# Patient Record
Sex: Female | Born: 1955 | Race: White | Hispanic: No | Marital: Married | Smoking: Former smoker
Health system: Southern US, Community
[De-identification: ages and names within clinical notes are randomized; demographics above are authoritative.]

## PROBLEM LIST (undated history)

## (undated) DIAGNOSIS — D6851 Activated protein C resistance: Secondary | ICD-10-CM

## (undated) DIAGNOSIS — D649 Anemia, unspecified: Secondary | ICD-10-CM

## (undated) DIAGNOSIS — G5600 Carpal tunnel syndrome, unspecified upper limb: Secondary | ICD-10-CM

## (undated) DIAGNOSIS — R112 Nausea with vomiting, unspecified: Secondary | ICD-10-CM

## (undated) DIAGNOSIS — S92902A Unspecified fracture of left foot, initial encounter for closed fracture: Secondary | ICD-10-CM

## (undated) DIAGNOSIS — Z9889 Other specified postprocedural states: Secondary | ICD-10-CM

## (undated) DIAGNOSIS — M8580 Other specified disorders of bone density and structure, unspecified site: Secondary | ICD-10-CM

## (undated) DIAGNOSIS — Z8719 Personal history of other diseases of the digestive system: Secondary | ICD-10-CM

## (undated) DIAGNOSIS — R55 Syncope and collapse: Secondary | ICD-10-CM

## (undated) DIAGNOSIS — F32A Depression, unspecified: Secondary | ICD-10-CM

## (undated) DIAGNOSIS — F419 Anxiety disorder, unspecified: Secondary | ICD-10-CM

## (undated) DIAGNOSIS — E162 Hypoglycemia, unspecified: Secondary | ICD-10-CM

## (undated) DIAGNOSIS — N9489 Other specified conditions associated with female genital organs and menstrual cycle: Secondary | ICD-10-CM

## (undated) DIAGNOSIS — K649 Unspecified hemorrhoids: Secondary | ICD-10-CM

## (undated) DIAGNOSIS — D179 Benign lipomatous neoplasm, unspecified: Secondary | ICD-10-CM

## (undated) DIAGNOSIS — E559 Vitamin D deficiency, unspecified: Secondary | ICD-10-CM

## (undated) DIAGNOSIS — Z8742 Personal history of other diseases of the female genital tract: Secondary | ICD-10-CM

## (undated) DIAGNOSIS — E041 Nontoxic single thyroid nodule: Secondary | ICD-10-CM

## (undated) DIAGNOSIS — Z86010 Personal history of colonic polyps: Secondary | ICD-10-CM

## (undated) DIAGNOSIS — E063 Autoimmune thyroiditis: Secondary | ICD-10-CM

## (undated) DIAGNOSIS — F329 Major depressive disorder, single episode, unspecified: Secondary | ICD-10-CM

## (undated) DIAGNOSIS — M1990 Unspecified osteoarthritis, unspecified site: Secondary | ICD-10-CM

## (undated) DIAGNOSIS — E039 Hypothyroidism, unspecified: Secondary | ICD-10-CM

## (undated) HISTORY — PX: DILATION AND CURETTAGE OF UTERUS: SHX78

## (undated) HISTORY — PX: UPPER GI ENDOSCOPY: SHX6162

## (undated) HISTORY — PX: PELVIC LAPAROSCOPY: SHX162

## (undated) HISTORY — DX: Other specified disorders of bone density and structure, unspecified site: M85.80

## (undated) HISTORY — PX: BLEPHAROPLASTY: SUR158

## (undated) HISTORY — DX: Major depressive disorder, single episode, unspecified: F32.9

## (undated) HISTORY — DX: Autoimmune thyroiditis: E06.3

## (undated) HISTORY — PX: ENDOMETRIAL ABLATION: SHX621

## (undated) HISTORY — DX: Activated protein C resistance: D68.51

## (undated) HISTORY — DX: Hypoglycemia, unspecified: E16.2

## (undated) HISTORY — DX: Anxiety disorder, unspecified: F41.9

## (undated) HISTORY — PX: LIPOMA EXCISION: SHX5283

## (undated) HISTORY — PX: COLONOSCOPY: SHX174

## (undated) HISTORY — DX: Depression, unspecified: F32.A

---

## 1974-04-14 HISTORY — PX: TUBAL LIGATION: SHX77

## 1998-10-23 ENCOUNTER — Other Ambulatory Visit: Admission: RE | Admit: 1998-10-23 | Discharge: 1998-10-23 | Payer: Self-pay | Admitting: Gynecology

## 1999-02-14 ENCOUNTER — Emergency Department (HOSPITAL_COMMUNITY): Admission: EM | Admit: 1999-02-14 | Discharge: 1999-02-14 | Payer: Self-pay | Admitting: Emergency Medicine

## 1999-02-14 ENCOUNTER — Encounter: Payer: Self-pay | Admitting: Emergency Medicine

## 1999-07-08 ENCOUNTER — Other Ambulatory Visit: Admission: RE | Admit: 1999-07-08 | Discharge: 1999-07-08 | Payer: Self-pay | Admitting: Gynecology

## 1999-07-08 ENCOUNTER — Encounter (INDEPENDENT_AMBULATORY_CARE_PROVIDER_SITE_OTHER): Payer: Self-pay | Admitting: Specialist

## 1999-07-23 ENCOUNTER — Encounter: Payer: Self-pay | Admitting: Gynecology

## 1999-07-24 ENCOUNTER — Ambulatory Visit (HOSPITAL_COMMUNITY): Admission: RE | Admit: 1999-07-24 | Discharge: 1999-07-24 | Payer: Self-pay | Admitting: Internal Medicine

## 1999-07-24 ENCOUNTER — Encounter (INDEPENDENT_AMBULATORY_CARE_PROVIDER_SITE_OTHER): Payer: Self-pay | Admitting: *Deleted

## 1999-11-05 ENCOUNTER — Encounter: Payer: Self-pay | Admitting: Neurosurgery

## 1999-11-05 ENCOUNTER — Ambulatory Visit (HOSPITAL_COMMUNITY): Admission: RE | Admit: 1999-11-05 | Discharge: 1999-11-05 | Payer: Self-pay | Admitting: Neurosurgery

## 1999-11-14 ENCOUNTER — Encounter: Payer: Self-pay | Admitting: Neurosurgery

## 1999-11-14 ENCOUNTER — Encounter: Admission: RE | Admit: 1999-11-14 | Discharge: 1999-11-14 | Payer: Self-pay | Admitting: Neurosurgery

## 1999-12-03 ENCOUNTER — Other Ambulatory Visit: Admission: RE | Admit: 1999-12-03 | Discharge: 1999-12-03 | Payer: Self-pay | Admitting: Gynecology

## 1999-12-04 ENCOUNTER — Encounter: Payer: Self-pay | Admitting: Gynecology

## 1999-12-04 ENCOUNTER — Ambulatory Visit (HOSPITAL_COMMUNITY): Admission: RE | Admit: 1999-12-04 | Discharge: 1999-12-04 | Payer: Self-pay | Admitting: Gynecology

## 2000-01-01 ENCOUNTER — Encounter (INDEPENDENT_AMBULATORY_CARE_PROVIDER_SITE_OTHER): Payer: Self-pay | Admitting: Specialist

## 2000-01-01 ENCOUNTER — Inpatient Hospital Stay (HOSPITAL_COMMUNITY): Admission: RE | Admit: 2000-01-01 | Discharge: 2000-01-04 | Payer: Self-pay | Admitting: Gynecology

## 2000-01-09 ENCOUNTER — Ambulatory Visit (HOSPITAL_COMMUNITY): Admission: RE | Admit: 2000-01-09 | Discharge: 2000-01-09 | Payer: Self-pay | Admitting: Gynecology

## 2000-01-09 ENCOUNTER — Encounter: Payer: Self-pay | Admitting: Gynecology

## 2000-01-10 ENCOUNTER — Inpatient Hospital Stay (HOSPITAL_COMMUNITY): Admission: AD | Admit: 2000-01-10 | Discharge: 2000-01-10 | Payer: Self-pay | Admitting: Gynecology

## 2000-05-15 DIAGNOSIS — N9489 Other specified conditions associated with female genital organs and menstrual cycle: Secondary | ICD-10-CM

## 2000-05-15 HISTORY — DX: Other specified conditions associated with female genital organs and menstrual cycle: N94.89

## 2000-06-05 ENCOUNTER — Ambulatory Visit (HOSPITAL_COMMUNITY): Admission: RE | Admit: 2000-06-05 | Discharge: 2000-06-05 | Payer: Self-pay | Admitting: Gynecology

## 2000-06-05 ENCOUNTER — Encounter: Payer: Self-pay | Admitting: Gynecology

## 2000-07-01 ENCOUNTER — Ambulatory Visit: Admission: RE | Admit: 2000-07-01 | Discharge: 2000-07-01 | Payer: Self-pay | Admitting: Gynecology

## 2000-07-08 ENCOUNTER — Encounter (INDEPENDENT_AMBULATORY_CARE_PROVIDER_SITE_OTHER): Payer: Self-pay | Admitting: Specialist

## 2000-07-08 ENCOUNTER — Inpatient Hospital Stay (HOSPITAL_COMMUNITY): Admission: RE | Admit: 2000-07-08 | Discharge: 2000-07-10 | Payer: Self-pay | Admitting: Gynecology

## 2001-04-14 HISTORY — PX: ABDOMINAL HYSTERECTOMY: SHX81

## 2001-04-14 HISTORY — PX: CARPAL TUNNEL RELEASE: SHX101

## 2001-06-03 ENCOUNTER — Other Ambulatory Visit: Admission: RE | Admit: 2001-06-03 | Discharge: 2001-06-03 | Payer: Self-pay | Admitting: Gynecology

## 2002-06-20 ENCOUNTER — Encounter: Payer: Self-pay | Admitting: Family Medicine

## 2002-06-20 ENCOUNTER — Encounter: Admission: RE | Admit: 2002-06-20 | Discharge: 2002-06-20 | Payer: Self-pay | Admitting: Family Medicine

## 2002-06-21 DIAGNOSIS — Z8719 Personal history of other diseases of the digestive system: Secondary | ICD-10-CM

## 2002-06-21 HISTORY — DX: Personal history of other diseases of the digestive system: Z87.19

## 2002-11-02 ENCOUNTER — Ambulatory Visit (HOSPITAL_COMMUNITY): Admission: RE | Admit: 2002-11-02 | Discharge: 2002-11-02 | Payer: Self-pay | Admitting: Family Medicine

## 2002-11-02 ENCOUNTER — Encounter: Payer: Self-pay | Admitting: Family Medicine

## 2003-03-16 ENCOUNTER — Other Ambulatory Visit: Admission: RE | Admit: 2003-03-16 | Discharge: 2003-03-16 | Payer: Self-pay | Admitting: Gynecology

## 2004-03-19 ENCOUNTER — Other Ambulatory Visit: Admission: RE | Admit: 2004-03-19 | Discharge: 2004-03-19 | Payer: Self-pay | Admitting: Gynecology

## 2004-04-25 DIAGNOSIS — Z8719 Personal history of other diseases of the digestive system: Secondary | ICD-10-CM

## 2004-04-25 DIAGNOSIS — Z860100 Personal history of colon polyps, unspecified: Secondary | ICD-10-CM

## 2004-04-25 DIAGNOSIS — K649 Unspecified hemorrhoids: Secondary | ICD-10-CM

## 2004-04-25 HISTORY — DX: Personal history of colon polyps, unspecified: Z86.0100

## 2004-04-25 HISTORY — DX: Unspecified hemorrhoids: K64.9

## 2004-04-25 HISTORY — DX: Personal history of other diseases of the digestive system: Z87.19

## 2004-04-25 HISTORY — DX: Personal history of colonic polyps: Z86.010

## 2005-03-20 ENCOUNTER — Other Ambulatory Visit: Admission: RE | Admit: 2005-03-20 | Discharge: 2005-03-20 | Payer: Self-pay | Admitting: Gynecology

## 2005-04-21 ENCOUNTER — Encounter: Admission: RE | Admit: 2005-04-21 | Discharge: 2005-07-20 | Payer: Self-pay | Admitting: Gynecology

## 2006-03-23 ENCOUNTER — Other Ambulatory Visit: Admission: RE | Admit: 2006-03-23 | Discharge: 2006-03-23 | Payer: Self-pay | Admitting: Gynecology

## 2006-05-02 ENCOUNTER — Emergency Department (HOSPITAL_COMMUNITY): Admission: EM | Admit: 2006-05-02 | Discharge: 2006-05-02 | Payer: Self-pay | Admitting: Emergency Medicine

## 2007-03-25 ENCOUNTER — Other Ambulatory Visit: Admission: RE | Admit: 2007-03-25 | Discharge: 2007-03-25 | Payer: Self-pay | Admitting: Gynecology

## 2007-05-31 ENCOUNTER — Encounter: Admission: RE | Admit: 2007-05-31 | Discharge: 2007-05-31 | Payer: Self-pay | Admitting: Family Medicine

## 2008-03-23 ENCOUNTER — Ambulatory Visit: Payer: Self-pay | Admitting: Otolaryngology

## 2008-03-27 ENCOUNTER — Encounter: Payer: Self-pay | Admitting: Gynecology

## 2008-03-27 ENCOUNTER — Ambulatory Visit: Payer: Self-pay | Admitting: Gynecology

## 2008-03-27 ENCOUNTER — Other Ambulatory Visit: Admission: RE | Admit: 2008-03-27 | Discharge: 2008-03-27 | Payer: Self-pay | Admitting: Gynecology

## 2008-04-14 HISTORY — PX: KNEE ARTHROSCOPY: SUR90

## 2008-04-24 ENCOUNTER — Ambulatory Visit: Payer: Self-pay | Admitting: Gynecology

## 2008-06-26 ENCOUNTER — Ambulatory Visit: Payer: Self-pay | Admitting: Gynecology

## 2008-08-16 ENCOUNTER — Ambulatory Visit: Payer: Self-pay | Admitting: Otolaryngology

## 2008-09-12 ENCOUNTER — Ambulatory Visit: Payer: Self-pay | Admitting: Gynecology

## 2009-03-28 ENCOUNTER — Other Ambulatory Visit: Admission: RE | Admit: 2009-03-28 | Discharge: 2009-03-28 | Payer: Self-pay | Admitting: Gynecology

## 2009-03-28 ENCOUNTER — Ambulatory Visit: Payer: Self-pay | Admitting: Gynecology

## 2009-04-03 ENCOUNTER — Ambulatory Visit: Payer: Self-pay | Admitting: Gynecology

## 2009-07-26 ENCOUNTER — Encounter: Admission: RE | Admit: 2009-07-26 | Discharge: 2009-07-26 | Payer: Self-pay | Admitting: Endocrinology

## 2010-01-28 ENCOUNTER — Encounter: Admission: RE | Admit: 2010-01-28 | Discharge: 2010-01-28 | Payer: Self-pay | Admitting: Endocrinology

## 2010-03-29 ENCOUNTER — Ambulatory Visit: Payer: Self-pay | Admitting: Gynecology

## 2010-03-29 ENCOUNTER — Other Ambulatory Visit
Admission: RE | Admit: 2010-03-29 | Discharge: 2010-03-29 | Payer: Self-pay | Source: Home / Self Care | Admitting: Gynecology

## 2010-04-01 ENCOUNTER — Ambulatory Visit: Payer: Self-pay | Admitting: Gynecology

## 2010-05-14 ENCOUNTER — Ambulatory Visit
Admission: RE | Admit: 2010-05-14 | Discharge: 2010-05-14 | Payer: Self-pay | Source: Home / Self Care | Attending: Gynecology | Admitting: Gynecology

## 2010-08-30 NOTE — Op Note (Signed)
[facility]  Patient:    Sandra Roy, Sandra Roy                 MRN: [id number] Proc. Date: 07/08/00 Adm. Date:  [id number] Attending:  Tonye Royalty CC:         Gaetano Hawthorne. Lily Peer, M.D.             Telford Nab, R.N.                           Operative Report  PREOPERATIVE DIAGNOSIS:  Complex left adnexal mass.  POSTOPERATIVE DIAGNOSES:  Hydrosalpinx, pelvic adhesive disease, retroperitoneal fibrosis.  PROCEDURE:  Exploratory laparotomy, lysis of intestinal adhesions, ureterolysis, left salpingo-oophorectomy.  SURGEON:  Daniel L. Clarke-Pearson, M.D.  ASSISTANT:  Reynaldo Minium, M.D.  ANESTHESIA:  General with oral tracheal tube.  ESTIMATED BLOOD LOSS:  20 cc.  SURGICAL FINDINGS:  At exploratory laparotomy, the patient had dense adhesions of the sigmoid colon to the left pelvic side wall encasing the left tube and ovary. There was retroperitoneal fibrosis encountered encasing the left ureter. Once the mass was dissected free from the colon and pelvic side wall, it was found to be a hydrosalpinx and a cystic lesion on the ovary both of which were benign on frozen section. The remainder of the pelvis appeared relatively normal except for adhesions of the side wall to the sigmoid colon.  DESCRIPTION OF PROCEDURE:  The patient was brought to the operating room and after satisfactory attainment of general anesthesia was placed in the modified lithotomy position in Moenkopi stirrups. The anterior abdominal wall, perineum and vagina were prepped with Betadine, Foley catheter was placed and the patient was draped. The abdomen was entered through a Pfannenstiel incision. Adhesions of the colon to the left pelvic side wall were lysed. A Buchwalter retractor was positioned and the small bowel was packed out of the pelvis. The left retroperitoneal space was opened identifying the external iliac artery and vein and ureter. Using blunt and sharp  dissection and cautery for hemostasis, the sigmoid colon was freed from the pelvic side wall. The ovarian vessels were skeletonized, isolated, clamped, cut, free tied and suture ligated. Throughout the procedure, the ureter was observed to be free from the field. Using sharp and blunt dissection, ureterolysis is performed in order to mobilize the ureter from the side wall and away from the pelvic mass. The sigmoid was further dissected free from the tube and ovary. The adherence to the vaginal cuff was clamped with Anderson clamps, divided and suture ligated using 2-0 Vicryl until the entire specimen was removed. This was submitted to frozen section and reported as benign. The pelvis was irrigated with copious amounts of warm saline, hemostasis was ascertained. The packs and retractors removed, the anterior abdominal wall was closed in layers the first being a running suture of 2-0 Vicryl on the peritoneum. Hemostasis was achieved with cautery. The fascia was reapproximated with a running suture of #0 PDS. The subcutaneous tissue was irrigated, hemostasis achieved with cautery and the skin closed with skin staples. A dressing was applied. The patient was awakened from anesthesia and taken to the recovery room in satisfactory condition. Sponge, needle and instrument counts were correct x 2. DD:  07/08/00 TD:  07/08/00 Job: [id number]

## 2010-08-30 NOTE — H&P (Signed)
[facility]  Patient:    Sandra Roy, Sandra Roy                 MRN: [id number] Adm. Date:  07/01/00 Attending:  Rande Brunt. Clarke-Pearson, M.D. CC:         Gaetano Hawthorne. Lily Peer, M.D.  Telford Nab, R.N.   History and Physical  NEW PATIENT EVALUATION  HISTORY OF PRESENT ILLNESS:  55 year old white female seen at the request of Dr. Gaetano Hawthorne. Lily Peer for preoperative preparation prior to surgery for a complex adnexal mass.  The patients gynecologic history dates back essentially to April of 2001 when she underwent endometrial ablation for dysfunctional uterine bleeding. Subsequent to that, she was found to have a right ovarian cyst and underwent exploratory laparotomy and right salpino-oophorectomy on January 01, 2000. 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  Subsequently, the patient is thought to have developed a hematoma in the pelvis and has been followed with serial ultrasounds. The ultrasounds have showed a persistent complex mass in the cul-de-sac. Most recently on February 19th, the mass measured 4.1 x 2.6 x 3.6 cm with another mass nearby measuring 5.9 x 1.8 x 3.6 cm. An MRI has been performed on February 22nd, also showing a complex mass which has tubular and cystic components both of which contain central high T1 and T2 signals suggestive of prior hemorrhage. There is some peripheral enhancement and the overall size measures 5.3 x 3.1 x 3.2 cm.  The patient is essentially asymptomatic at the present time.  PAST MEDICAL HISTORY:  Medical illnesses:  None.  PAST SURGICAL HISTORY:  Total abdominal hysterectomy, right salpingo-oophorectomy, endometrial ablation, bilateral tubal ligation.  ALLERGIES:  No known drug allergies.  SOCIAL HISTORY:  The patient is married. She has one living child who is 26 years of age and another one 57 years of age.  REVIEW OF SYSTEMS:  Essentially negative. She has no  GU or GI symptoms. She has no pelvic pain or pressure. She has no vaginal bleeding or discharge.  PHYSICAL EXAMINATION:  VITAL SIGNS:  Height 5 feet 3 inches, weight 150 pounds. Blood pressure 120/70.  GENERAL:  She is a healthy white female in in no acute distress.  HEENT:  Negative.  NECK:  Supple without thyromegaly.  LYMPHATICS:  There is no supraclavicular or inguinal adenopathy.  ABDOMEN:  Soft and nontender. She has a healed transverse incision and a subumbilical vertical incision where she had her post partum bilateral tubal ligation.  PELVIC:  EGBUS normal. The vagina is clean. The cuff is well healed and well supported and no lesions noted. Bimanual and rectovaginal exam reveals some fullness at the vaginal cuff consistent with a retained ovary. There is no tenderness.  LABORATORY AND ACCESSORY DATA:  MRI and ultrasound reports are reviewed and I have discussed the case with Dr. Lily Peer by telephone.  IMPRESSION:  Complex persistent adnexal mass. It is unclear as to whether this might be a hydrosalpinx or an ovarian cyst. None the less, it is persistent and I agree with Dr. Lily Peer with surgical exploration and removal of the mass is most appropriate. We will plan on surgery next week.  The patient is aware of the risks of surgery including hemorrhage, infection, injury to adjacent viscerae and especially the colon and small bowel which might be adherent in this area as well as the ureter and bladder. All of her questions are answered. We will proceed with the surgery. We will  go ahead and give the patient a Fleets Phospho-Soda bowel prep so that she has adequate mechanical preparation should gastrointestinal surgery be necessary. DD:  07/01/00 TD:  07/01/00 Job: [id number]

## 2010-08-30 NOTE — H&P (Signed)
[facility]  Patient:    Sandra Roy, Sandra Roy                 MRN: [id number] Adm. Date:  01/01/00 Attending:  Gaetano Hawthorne. Lily Peer, M.D.                         History and Physical  DATE OF SCHEDULED SURGERY:  January 01, 2000, at 12:30 p.m., at [facility].  CHIEF COMPLAINT:  Pelvic mass.  HISTORY OF PRESENT ILLNESS:  The patient is a 55 year old gravida 4, para 3, AB 1 who was seen in the office for her gynecological examination as well as for a follow-up ultrasound.  Patient had had an endometrial ablation in April of this year secondary to dysfunctional uterine bleeding.  At that time, he had a benign endometrial biopsy and, at time of evaluation, she had a thin-walled cyst measuring 3.9 x 3.0 x 3.6 cm, echo free, and was to be followed up postoperatively after endometrial ablation.  She was seen in the office on August 21 for her annual exam.  She also had an ultrasound.  The ultrasound demonstrated that her uterus measured 9.0 x 4.1 x 6.0 cm.  The right ovary had a complex, multiloculated cystic mass measuring 7.5 x 2.6 x 5.9 cm with a thin septation measuring 2 mm in size with an echogenic focus of 25 x 10 mm loculated at the wall of the mass.  Focal wall thickening was noted inferior to the wall but no flow was seen with a color flow Doppler.  The cul de sac was negative and the left ovary was normal.  Patient had had some back surgery done for some discomfort recently and had been seen by a neurosurgeon who did an MRI and CT of her back which were reported to be negative.  On examination on the office visit on August 21, there was a fullness of the right adnexa which was slightly tender on exam.  Her Hemoccult was negative. We had had a lengthy discussion that, due to the fact that she was 55 years of age with this ovarian cyst and having it increased in size, it was recommended we proceed with an exploratory laparotomy and total  abdominal hysterectomy with right salpingo-oophorectomy and possible bilateral salpingo-oophorectomy. As part of her workup, she had a CA 125 which was normal at 10.7.  Her Pap smear also had been normal.  I had explained to her that the CA 125 was nonspecific, that along with physical findings and an ultrasound, could make one suspicious that this potentially could be a malignancy.  As part of her workup, she also had an abdominal-pelvic CT scan.  There was no evidence of abdominal or pelvic lymphadenopathy.  The CT scan once again confirmed the right adnexal mass.  By CT scan, the mass measured 2.5 x 6 cm in greatest dimension.  PAST MEDICAL HISTORY:  Menarche was at the age of 37.  She has had four pregnancies of which one was a miscarriage.  They were delivered vaginally. Operations have included tubal ligation in 1976, also laparoscopy for a cyst back in the 1970s, and she had an endometrial ablation in the year 2001 in April.  She also suffers from anxiety and back pain.  She takes Celebrex, Zanaflex, Xanax occasionally, vitamin E and C, and she takes baby aspirin and multivitamin.  On questioning that day, patient has stated that her mother had had a history  of deep venous thrombosis and it was decided to proceed with testing for factor V mutation Leiden.  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  PHYSICAL EXAMINATION:  GENERAL:  A well-developed, well-nourished female.  HEENT:  Unremarkable.  NECK:  Supple.  Trachea midline.  No carotid bruits.  No thyromegaly.  LUNGS:  Clear to auscultation without rhonchi or wheezes.  HEART:  Regular rate and rhythm.  No murmurs or gallops.  BREASTS:  Examination done with no noted breast masses, supraclavicular or axillary lymphadenopathy.  ABDOMEN:  Soft and nontender without rebound or  guarding.  PELVIC:  Bartholins, urethral, Skenes are within normal limits.  On examination of the cervix, no lesion or discharge.  Uterus anteverted, normal size, shape, and consistency.  Fullness in the right adnexa was noted and left adnexa normal.  RECTAL:  Exam confirmatory.  No masses per se but Hemoccult was also negative.  ASSESSMENT:  A 55 year old gravida 4, para 3, abortus 1, with increasing sized pelvic mass in the right ovary, multiloculated, normal CA 125 Abdominal-pelvic CT scan without any evidence of lymphadenopathy.  Pap smear was normal. Mammogram in August of last year was normal.  Chest x-ray was normal.  Patient had been provided with literature information on hysterectomy.  We had discussed several options:  1) To proceed with an exploratory laparotomy with right salpingo-oophorectomy, or 2) total abdominal hysterectomy with right salpingo-oophorectomy, or 3) as she is 55 years of age, proceed with a total abdominal hysterectomy and bilateral salpingo-oophorectomy, or 4) offer no option if this was a malignancy, then we would have to proceed with a full staging procedure.  I also explained to her that I would have my colleagues, either Dr. Emmaline Kluver or Dr. Ronita Hipps, GYN oncologist from [facility] on standby in the event that this is a malignancy so that we may gain from his expertise to proceeding with a full staging procedure.  Since recently, she had stated that her mother had had deep venous thrombosis as mentioned above.  She was tested for factor V mutation Leiden and was found to be heterozygous for factor V Leiden.  I have explained to her this is associated with an activated protein C resistance and increased risk for venous thrombosis.  I consulted the hematologist oncologist, Dr. Cyndie Chime and, due to my concerns that carriers for factor V Leiden polymorphism have increased risk of thrombosis, what type of anticoagulation would  be recommended.  He did not recommend any preoperative anticoagulation but did recommend to place the patient on Lovenox 1 mg/kg subcu q.12h. until  ambulation.  I have explained this to the patient in detail.  Also, he will follow up her postoperatively when she has completed seeing me for her postoperative care for further evaluation and perhaps further testing and counseling.  Since this was just recently determined and she is 55 years of age, I would encourage perhaps that she consider leaving one ovary unless, obviously, if she does have a malignancy.  That way, we can optimize her own natural source of estrogen production and not have to put her on any hormone replacement therapy which could potentially increase her risk of thrombosis due to her heterozygous state for factor V Leiden.  As part of prophylaxis for deep venous thrombosis, she also will have pneumatic compression stocking, will also administer intravenous antibiotic for prophylaxis, as well.  Patient is made aware of this and also there is potential risk for complications such as trauma to internal organs, to the bladder, intestines, nerves, and blood vessels.  In the event of uncontrollable hemorrhage, there is a risk that some complications from blood transfusions to include anaphylactic reaction, human immunodeficiency virus, and hepatitis were discussed.  She is fully aware that she would not be able to have any more children at this point.  All of these issues were discussed in detail and all questions were answered and will follow accordingly.  PLAN:  Patient is scheduled for exploratory laparotomy, total abdominal hysterectomy, with right salpingo-oophorectomy, possible bilateral salpingo-oophorectomy, possible omentectomy, possible periaortic and pelvic lymphadenectomy, and staging in the event of malignancy tomorrow at 12:30 p.m. at [facility]. DD:  12/31/99 TD:  12/31/99 Job: [id number]

## 2010-08-30 NOTE — Op Note (Signed)
[facility]  Patient:    Sandra Roy, Sandra Roy                 MRN: [id number] Proc. Date: 01/01/00 Attending:  Gaetano Hawthorne. Lily Peer, M.D.                           Operative Report  SURGEON:  Juan H. Lily Peer, M.D.  FIRST ASSISTANT:  Timothy P. Fontaine, M.D.  INDICATIONS FOR PROCEDURE:  A 55 year old, gravida 4, para 3, AB 1 with an enlarging right pelvic mass. Preop evaluation to include abdominal pelvic CT scan with no evidence of lymphadenopathy. The patients recent mammogram and Pap smears have been normal and CA 125 was also normal.  PREOPERATIVE DIAGNOSES:  Right pelvic mass.  POSTOPERATIVE DIAGNOSES:  Serous cystadenoma of the right ovary.  ANESTHESIA:  General endotracheal.  PROCEDURES: 1. Total abdominal hysterectomy. 2. Right salpingo-oophorectomy. 3. Pelvic washings.  FINDINGS:  A 5 x 6 cm right ovarian cyst with no excrescences noted and no evidence of adhesions. The left ovary was normal, normal uterus, evidence of previous sterilization, tubal interruption on both tubes were noted. Normal peritoneal surface.  DESCRIPTION OF PROCEDURE:  After the patient was adequately counseled, she was taken to the operating room where she underwent a successful general endotracheal anesthesia. She was placed in the low lithotomy position, the abdomen and vagina were prepped and draped in the usual sterile fashion. A Foley catheter was inserted in an effort to monitor urinary output. After the drapes were in place, a Pfannenstiel skin incision was made 2 cm above the symphysis pubis down through the subcutaneous tissue and down to the rectus fascia whereby a midline nick was made. The fascia was incised a transverse fashion. The midline raphe in the peritoneal cavity was entered cautiously. The patient was placed in Trendelenburg position and the bowel was packed in a cephalic direction in an effort to expose the pelvic  cavity. OConnor-OSullivan retractors were used for exposure. Both adnexa were grasped with Heaney clamps and placed under tension and the right round ligament was identified and on the right side was the 5 x 6 cm really mobile cystic mass on the right ovary. This was to be removed first and submitted for frozen. Of note, pelvic washings were obtained before this part of the procedure. After the round ligament was placed under tension, this was secured with #0 Vicryl suture and the right round ligament was transected and the anterior broad ligament was incised all the way to the level of the internal cervical os. The right ureter was identified and the posterior broad ligament was penetrated with a surgeons finger and the right infundibulopelvic ligament was clamped, cut and free tied followed by transfixation stitch of #0 Vicryl suture at all times keeping the ureter in view in a way from the instruments. After this, additional skeletonization was performed and the right tube and ovary was excised from the uterus and passed off the operative field for a frozen. We were notified in the operating room that the frozen specimen demonstrated this to be a benign serous cystadenoma. After this, a similar procedure was carried out on the contralateral side but with left ovarian conservation and the left utero-ovarian ligament was clamped, cut and suture ligated and tied with a free tie followed by transfixation stitch and thus leaving the left tube and ovary behind. With additional skeletonization, the broad ligament was incised to the level of the internal  cervical os and skeletonization of both parametrial tissue to the level of the uterine artery. Serial clamp, cut and suture ligature with #0 Vicryl suture was performed to bring down the remaining broad and cardinal ligaments down to the level of the vaginal fornices at which time it was entered and with the Satinsky scissors, the cervix was  excised from the vagina and passed off the operative field. The angles were secured with #0 Vicryl suture and the remaining cuff was secured with interrupted sutures of #0 Vicryl suture. The pelvic cavity was copiously irrigated with normal saline solution. There was no evidence of any adhesions or any abnormalities on the peritoneal surfaces noted. The sponge count and needle count were correct. The patient was straightened from the Trendelenburg position as she was placed and the OConnor-OSullivan retractor was removed. The visceral peritoneum was not reapproximated and the fascia was closed with a running stitch of #0 Vicryl suture. The subcutaneous bleeders were Bovie cauterized, the skin was reapproximated with skin clips following by placing xeroform gauze and ______ dressing. The patient was extubated and transferred to the recovery room with stable vital signs. Blood loss was recorded at 75 cc and fluid resuscitation consisted of 1600 cc of lactated Ringers. Of note, the patient did receive a gram of Cefotan IV prophylactically and she also had pneumatic compression stockings for DVT prophylaxis. Also due to the fact that the patient has lightened factor protein C deficiency, she will receive lovenox 1 mg per kilogram subcu q 12h until ambulating. DD:  01/01/00 TD:  01/03/00 Job: [id number]

## 2010-08-30 NOTE — H&P (Signed)
[facility]  Patient:    Sandra Roy, Sandra Roy                 MRN: [id number] Adm. Date:  [id number] Disc. Date: [id number] Attending:  Tonye Royalty                         History and Physical  CHIEF COMPLAINT:  Persistent pelvic mass.  HISTORY OF PRESENT ILLNESS:  The patient is a 55 year old gravida 4, para 3, AB 1, who is status post total abdominal hysterectomy, right salpingo-oophorectomy on January 01, 2000.  The patient subsequently postoperatively had done well with the exception of a surgical anemia, for which she was placed on iron supplementation.  She had visited the office on several occasions because she was having some bloody rhinous discharge and on evaluation, it appeared that it was granulation tissue in the healing of the back of the vagina and with some of the abdominal discomfort that she had, we proceeded with a serial ultrasound.  We had followed her for several months with the ultrasound in the hope that this was a resulting hematoma in the cul-de-sac.  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  The patient also had a history of factor 5 mutation, Leiden, had received prophylaxis anticoagulation postoperatively consisting of Lovenox 1 mg/kg subcu every 12 hours.  The patient is continuing as mentioned to be followed with serial ultrasound, but continues to have a persistent cul-de-sac mass.  Last ultrasound on March 18 demonstrated the left ovary measuring 3.1 x 2.3 x 2.7 cm with a thin-walled cyst that measured 19 x 19 mm with internal low level echoes.  There was no vascular flow noted.  The complex cyst measured 19 x 17 mm with a single septation, negative vascular flow also.  She had echo free cyst also noted at 11 mm in size, and cystic areas were described, having decreased in size from February 19.  But of note the cul-de-sac, although there was no free fluid noted, there was a complex  mass that measured 24 x 21 x 24 cm with a thick wall and internal low level echoes and a second mass measuring 34 x 22 x 30 mm with cystic solid areas, irregular wall, and areas had decreased somewhat in size from previous scan a month ago. Color powered Doppler demonstrated increased vascular flow in both complex areas.  On February 22 the patient had an MRI of the pelvis.  There was no evidence of pelvic adenopathy at the site of the ureteral dilatation.  Last year patient had a CA-125 which had been normal before her surgery in September.  PAST MEDICAL HISTORY:  Patient with total abdominal hysterectomy, right salpingo-oophorectomy in September 2001.  She had menarche at the age of 82. She has had four pregnancies, of which one was a miscarriage.  The pregnancies to term were delivered vaginally.  Other operations have consisted of prior tubal ligation in 1976.  She also had laparoscopy for a cyst in her ovary back in the 1970s.  She has had endometrial ablation in 2001 and she also suffered from anxiety and back pain.  Patient also with history of factor 5 mutation (Leiden/heterozygous).  Patient has had history of urethral dilatation in the past.  She has a sister with a history of breast cancer.  She also had history of urinary incontinence in the past.  Also she has had an ovarian cystectomy  in her left ovary at the time of her pregnancy in 42 in [place], which was reported to be normal.  She also has a history of hypoglycemia, diagnosed in 1989.  PHYSICAL EXAMINATION:  GENERAL:  Well-developed, well-nourished female.  HEENT:  Unremarkable.  NECK:  Supple.  Trachea in midline.  No carotid bruits, no thyromegaly.  LUNGS:  Clear to auscultation without rhonchi or wheezes.  HEART:  Regular rate and rhythm.  No murmurs or gallops.  BREASTS:  Not recently done.  ABDOMEN:  Soft, nontender without rebound or guarding.  PELVIC:  Bartholin, urethral, and Skene glands within  normal limits. Vaginal cuff intact.  Bimanual examination:  Fullness in the cul-de-sac area confirmed on rectal examination.  ASSESSMENT:  A 54 year old gravida 4, para 3, AB 1 with history of total abdominal hysterectomy and right salpingo-oophorectomy for a right ovarian cystadenoma bleeding from last year with a persistent cul-de-sac mass, which initially was assumed that it was a resolving hematoma that has decreased in size, very small, now multiloculated with complex component.  The patient will undergo an exploratory laparotomy with excision of the cul-de-sac mass and also left salpingo-oophorectomy.  She will also, due to the fact that she has some factor V mutation Leiden deficiency, be placed postoperatively as an anticoagulant on Lovenox 1 mg/kg q.12h.  The patient also will have pneumatic compression stockings to prevent deviant thrombosis.  She will also receive antibiotics and prophylaxis.  She is also aware that in the event that this is a malignancy, that a full staging procedure may need to be performed to include periaortic and pelvic lymphadenectomy.  My co-surgeon will be Dr. De Blanch, GYN oncologist from [facility].  In the event of uncontrollable bleeding or hemorrhage, patient is fully aware that she may receive a blood transfusion with a potential risk of antiphylactic reaction, hepatitis, and AIDS were discussed.  Due to the fact the patient cannot tolerate a GoLYTELY bowel prep last time, she will be given a Fleet enema the day before and the morning of her surgery, as well as on clear liquids for 24 hours prior.  Risks, benefits, pros, and cons of the procedure were discussed in detail with the patient.  All questions were answered and will follow accordingly. DD:  07/07/00 TD:  07/07/00 Job: [id number]

## 2010-08-30 NOTE — Discharge Summary (Signed)
[facility]  Patient:    Sandra Roy, Sandra Roy                 MRN: [id number] Adm. Date:  [id number] Disc. Date: [id number] Attending:  Douglass Rivers                           Discharge Summary  HISTORY OF PRESENT ILLNESS:   The patient is a 55 year old, gravida 4, para 3, AB 1, who was admitted on the morning of September 19 whereby she underwent total abdominal hysterectomy, right salpingo-oophorectomy secondary to a right pelvic mass.  Findings during the surgery demonstrated a 5 x 6 cm right ovarian cyst with no excrescence.  The final pathology report had demonstrated benign ovarian serous cystadenoma of the right ovary, fallopian tube with benign paratubal cyst.  No evidence of borderline change or malignancy.  The uterus had demonstrated hemorrhagic endometrium with giant cell reaction.  No evidence of hyperplasia malignancy.  Cervix with mild cervicis and squamous metaplasia.  No evidence of dysplasia and myometrium had evidence of adenomyosis and no evidence of malignancy.  The patient had done well intraoperatively.  Due to the fact that she had protein C deficiency she had received Lovenox 1 mg/kg subcu q.12h. until she was ambulatory.  HOSPITAL COURSE:              On her first postoperative day her temperature max was 100.8.  Hemoglobin and hematocrit were 9.3 and 26.9 with a platelet count of 192,000.  Urine output had been greater than 75 cc/h and looks clear. Her blood pressure was apparently low at 87/53 and 87/41 mmHg, respectively. She was given 5 mg of ephedrine IV.  She was started on a clear liquid diet on that day.  On her second postoperative day, temperature max was 99.1 and blood pressure was 110/60 and 93/60, respectively.  After her catheter had been removed for the first 24 hours she had some ______ distention requiring in-and-out catheterization on several occasions but did not require to be fully catheterized once  again.  On the third postoperative day her hemoglobulin and hematocrit were now 7.7 and 22.6 with a platelet count of 170,000.  Orthostatic blood pressures were taken and she was 98/60 lying down with a pulse of 88 and sitting up blood pressure was 96/64 and pulse 96.  The patient had positive bowel sounds.  We had discussed the issue whether she could go home since she was up and ambulating and tolerating a full diet, and she opted to go home instead of receiving a blood transfusion to the fact that she was hemodynamically stable and was sent home on iron supplementation.  FINAL DIAGNOSES:              1. Right ovarian serous cystadenoma.                               2. Right fallopian tube benign paratubal cyst.                               3. Cervicitis and uterine adenomyosis.                               4. Postoperative anemia.  5. Protein C deficiency.  FINAL DISPOSITION FOLLOWUP:                     The patient was discharged home on her third postoperative day.  She was sent home on iron supplementation consisting of one tablet p.o. b.i.d.  She was given a prescription for Lortab 7.5/500 to take one p.o. q.4-6h. p.r.n. pain.  She is scheduled to return back to the office at two weeks and in six weeks for a postoperative evaluation.  Her staples were removed.  Her incision was Steri-Strips and instructions were provided to avoid lifting or strenuous activity. DD:  01/29/00 TD:  01/29/00 Job: [id number]

## 2010-08-30 NOTE — Op Note (Signed)
[facility]  Patient:    Sandra Roy, Sandra Roy                 MRN: [id number] Proc. Date: 07/24/99 Adm. Date:  [id number] Attending:  Tonye Royalty                           Operative Report  PREOPERATIVE DIAGNOSIS:       Chronic refractory dysfunctional uterine bleeding.  POSTOPERATIVE DIAGNOSIS:      Chronic refractory dysfunctional uterine bleeding.  OPERATION:                    Endometrial ablation.  SURGEON:                      Juan H. Lily Peer, M.D.  ASSISTANT:  ANESTHESIA:                   General endotracheal anesthesia.  ESTIMATED BLOOD LOSS:  INDICATIONS:                  A 55 year old, gravida 4, para 3, abortus 1, with  previous sterilization procedure with longstanding history of dysfunctional uterine bleeding.  Preoperative evaluation included endometrial biopsy with no abnormalities noted and also with no hyperplasia or malignancy noted and also sonohysterogram which was unremarkable.  TSH and prolactin had been normal and he patient had not responded to medical therapy in the past and was offered endometrial ablation.  DESCRIPTION OF PROCEDURE:     After the patient was adequately counseled, she was taken to the operating room where she underwent successful general endotracheal  anesthesia.  She was placed in the low lithotomy position.  The abdomen, vagina, and perineum were prepped and draped in the usual sterile fashion.  The red rubber Roxan Hockey was inserted into the bladder to evacuate its contents for a total of 5 cc.  Examination under anesthesia demonstrated slightly anteverted uterus, upper limits of normal.  A long weighted bill speculum was placed in the posterior vaginal vault and a Sims retractor anteriorly for exposure.  The anterior cervical lip was grasped with a single tooth tenaculum.  Of note, the laminaria that was  placed in the office the day before was removed and the vaginal  cervical area was cleansed with Betadine solution.  The patient did receive a gram of Cefotan prophylactically preoperatively.  Since she had a laminaria there was no cervical dilatation required.  The [facility] operative resectoscope with a 90 degree wire loupe was inserted into the intrauterine cavity.  The endocervical canal was smooth. The endometrium was lush and 3% Sorbitol was used as the distending media.  An endomyo resection was done with the [employer]leyl[employer] electrical surgical generator set at the following settings:  the cutting mode at 80 watts and in the coag mode at 80 watts also.  The intrauterine distending pressure was 80 mmHg.  In a systematic fashion, the endometrium up to the level of the superficial myometrium was excised in a core-like fashion and tissue fragments submitted for histological evaluation. After this was completed, the operating element was switched from a 90 degree wire loupe to a rollerball and the entire cavity was ablated for a depth of approximately to 3 mm in a circumferential fashion as well.  The single tooth tenaculum was removed. The patient was extubated and transferred to the recovery room with stable vital signs.  Estimated blood loss was  minimal.  Fluid resuscitation consisted of 1300 cc of Ringers lactate.  Distending medium total fluid deficit was 175 cc. DD:  07/24/99 TD:  07/24/99 Job: [id number]

## 2010-08-30 NOTE — Discharge Summary (Signed)
[facility]  Patient:    Sandra Roy, Sandra Roy                 MRN: [id number] Adm. Date:  [id number] Disc. Date: [id number] Attending:  Tonye Royalty                           Discharge Summary  HISTORY OF PRESENT ILLNESS AND HOSPITAL COURSE:  The patient is a 55 year old who, on the morning of July 08, 2000, underwent exploratory laparotomy, lysis of pelvic adhesions, and left salpingo-oophorectomy secondary to pelvic mass. Findings during her surgery had demonstrated pelvic adhesions, and pathology report demonstrated left ovary and fallopian tube with tubo-ovarian adhesions and a hydrosalpinx and benign follicular cyst, with no evidence of malignancy. On the patients postoperative day #1, her temperature max was 101.4 and decreased to 98.8.  Her vital signs otherwise were fine.  Her hemoglobin and hematocrit were 10.7 and 31.7 respectively, with a platelet count of 153,000. She was started on Lovenox 1 mg/kg subcutaneous to be administered every 12 hours due to the fact that the patient has a history of Leiden factor mutation.  On her first postoperative day she was noted to have good bowel sounds.  Her incisions was intact.  Her Foley was discontinued, and her PCA pump was also discontinued.  She was started on a full liquid diet.  She was ambulated and started on Percocet orally.  That evening the patient continued to do well and was tolerating her liquid diet and was advanced a regular diet, and she was discharged home on July 10, 2000.  She was ambulating, voiding well, tolerating a regular diet well.  She was to follow up in the office in 48-72 hours to have her staples removed.  She was given a prescription for Tylox take 1 p.o. q.4-6h. p.r.n. pain, and for vasomotor symptoms she was placed on Megace 20 mg b.i.d.  PROCEDURES: 1. Exploratory laparotomy. 2. Lysis of pelvic adhesions. 3. Left salpingo-oophorectomy.  FINDINGS:   Tubo-ovarian adhesions with hydrosalpinx with benign follicular cyst of the left ovary and fallopian tube.  DISPOSITION:  The patient was discharged home on her third postoperative day.  DISCHARGE INSTRUCTIONS:  She was discontinued from her Lovenox.  She was encouraged to continue her ambulation, regular diet.  She was to return to the office in 72 hours to have her staples removed.  Not to be engaging in any lifting or strenuous activity.  She was given a prescription for Tylox to take 1-2 tablets p.o. q.4-6h. p.r.n. pain.  For vasomotor symptoms she was place don Megace 20 mg b.i.d.  She will start on her iron supplementation over-the-counter in the next few days. DD:  08/02/00 TD:  08/03/00 Job: [id number]

## 2011-06-18 ENCOUNTER — Encounter: Payer: Self-pay | Admitting: Gynecology

## 2011-06-18 ENCOUNTER — Ambulatory Visit (INDEPENDENT_AMBULATORY_CARE_PROVIDER_SITE_OTHER): Payer: PRIVATE HEALTH INSURANCE | Admitting: Gynecology

## 2011-06-18 VITALS — BP 128/86 | Ht 64.0 in | Wt 198.0 lb

## 2011-06-18 DIAGNOSIS — F411 Generalized anxiety disorder: Secondary | ICD-10-CM

## 2011-06-18 DIAGNOSIS — E065 Other chronic thyroiditis: Secondary | ICD-10-CM

## 2011-06-18 DIAGNOSIS — Z01419 Encounter for gynecological examination (general) (routine) without abnormal findings: Secondary | ICD-10-CM

## 2011-06-18 DIAGNOSIS — M8580 Other specified disorders of bone density and structure, unspecified site: Secondary | ICD-10-CM | POA: Insufficient documentation

## 2011-06-18 DIAGNOSIS — F419 Anxiety disorder, unspecified: Secondary | ICD-10-CM

## 2011-06-18 DIAGNOSIS — E038 Other specified hypothyroidism: Secondary | ICD-10-CM | POA: Insufficient documentation

## 2011-06-18 DIAGNOSIS — N3281 Overactive bladder: Secondary | ICD-10-CM

## 2011-06-18 DIAGNOSIS — R635 Abnormal weight gain: Secondary | ICD-10-CM

## 2011-06-18 DIAGNOSIS — N318 Other neuromuscular dysfunction of bladder: Secondary | ICD-10-CM

## 2011-06-18 DIAGNOSIS — E063 Autoimmune thyroiditis: Secondary | ICD-10-CM | POA: Insufficient documentation

## 2011-06-18 LAB — CBC WITH DIFFERENTIAL/PLATELET
Basophils Absolute: 0 10*3/uL (ref 0.0–0.1)
Basophils Relative: 0 % (ref 0–1)
Eosinophils Absolute: 0.1 10*3/uL (ref 0.0–0.7)
Eosinophils Relative: 2 % (ref 0–5)
HCT: 41.2 % (ref 36.0–46.0)
Hemoglobin: 13.5 g/dL (ref 12.0–15.0)
Lymphocytes Relative: 35 % (ref 12–46)
Lymphs Abs: 1.9 10*3/uL (ref 0.7–4.0)
MCH: 30.3 pg (ref 26.0–34.0)
MCHC: 32.8 g/dL (ref 30.0–36.0)
MCV: 92.4 fL (ref 78.0–100.0)
Monocytes Absolute: 0.5 10*3/uL (ref 0.1–1.0)
Monocytes Relative: 9 % (ref 3–12)
Neutro Abs: 3 10*3/uL (ref 1.7–7.7)
Neutrophils Relative %: 55 % (ref 43–77)
Platelets: 246 10*3/uL (ref 150–400)
RBC: 4.46 MIL/uL (ref 3.87–5.11)
RDW: 13 % (ref 11.5–15.5)
WBC: 5.4 10*3/uL (ref 4.0–10.5)

## 2011-06-18 LAB — THYROID PANEL WITH TSH
Free Thyroxine Index: 2.1 (ref 1.0–3.9)
T3 Uptake: 28 % (ref 22.5–37.0)
T4, Total: 7.6 ug/dL (ref 5.0–12.5)
TSH: 1.19 u[IU]/mL (ref 0.350–4.500)

## 2011-06-18 LAB — CHOLESTEROL, TOTAL: Cholesterol: 152 mg/dL (ref 0–200)

## 2011-06-18 MED ORDER — ALPRAZOLAM 0.5 MG PO TABS: 0.5000 mg | ORAL_TABLET | Freq: Every evening | ORAL | Status: DC | PRN

## 2011-06-18 MED ORDER — MIRABEGRON ER 25 MG PO TB24: 25.0000 mg | ORAL_TABLET | Freq: Every day | ORAL | Status: DC

## 2011-06-18 NOTE — Patient Instructions (Addendum)
Exercise to Lose Weight Exercise and a healthy diet may help you lose weight. Your doctor may suggest specific exercises. EXERCISE IDEAS AND TIPS  Choose low-cost things you enjoy doing, such as walking, bicycling, or exercising to workout videos.   Take stairs instead of the elevator.   Walk during your lunch break.   Park your car further away from work or school.   Go to a gym or an exercise class.   Start with 5 to 10 minutes of exercise each day. Build up to 30 minutes of exercise 4 to 6 days a week.   Wear shoes with good support and comfortable clothes.   Stretch before and after working out.   Work out until you breathe harder and your heart beats faster.   Drink extra water when you exercise.   Do not do so much that you hurt yourself, feel dizzy, or get very short of breath.  Exercises that burn about 150 calories:  Running 1  miles in 15 minutes.   Playing volleyball for 45 to 60 minutes.   Washing and waxing a car for 45 to 60 minutes.   Playing touch football for 45 minutes.   Walking 1  miles in 35 minutes.   Pushing a stroller 1  miles in 30 minutes.   Playing basketball for 30 minutes.   Raking leaves for 30 minutes.   Bicycling 5 miles in 30 minutes.   Walking 2 miles in 30 minutes.   Dancing for 30 minutes.   Shoveling snow for 15 minutes.   Swimming laps for 20 minutes.   Walking up stairs for 15 minutes.   Bicycling 4 miles in 15 minutes.   Gardening for 30 to 45 minutes.   Jumping rope for 15 minutes.   Washing windows or floors for 45 to 60 minutes.  Document Released: 05/03/2010 Document Revised: 12/11/2010 Document Reviewed: 05/03/2010 [facility] Patient Information 2012 [place], [place].   Overactive Bladder, Adult The bladder has two functions that are totally opposite of the other. One is to relax and stretch out so it can store urine (fills like a balloon), and the other is to contract and squeeze down so that it can empty  the urine that it has stored. Proper functioning of the bladder is a complex mixing of these two functions. The filling and emptying of the bladder can be influenced by:  The bladder.   The spinal cord.   The brain.   The nerves going to the bladder.   Other organs that are closely related to the bladder such as prostate in males and the vagina in females.  As your bladder fills with urine, nerve signals are sent from the bladder to the brain to tell you that you may need to urinate. Normal urination requires that the bladder squeeze down with sufficient strength to empty the bladder, but this also requires that the bladder squeeze down sufficiently long to finish the job. In addition the sphincter muscles, which normally keep you from leaking urine, must also relax so that the urine can pass. Coordination between the bladder muscle squeezing down and the sphincter muscles relaxing is required to make everything happen normally. With an overactive bladder sometimes the muscles of the bladder contract unexpectedly and involuntarily and this causes an urgent need to urinate. The normal response is to try to hold urine in by contracting the sphincter muscles. Sometimes the bladder contracts so strongly that the sphincter muscles cannot stop the urine from passing out  and incontinence occurs. This kind of incontinence is called urge incontinence. Having an overactive bladder can be embarrassing and awkward. It can keep you from living life the way you want to. Many people think it is just something you have to put up with as you grow older or have certain health conditions. In fact, there are treatments that can help make your life easier and more pleasant. CAUSES  Many things can cause an overactive bladder. Possibilities include:  Urinary tract infection or infection of nearby tissues such as the prostate.   Prostate enlargement.   In women, multiple pregnancies or surgery on the uterus or  urethra.   Bladder stones, inflammation or tumors.   Caffeine.   Alcohol.   Medications. For example, diuretics (drugs that help the body get rid of extra fluid) increase urine production. Some other medicines must be taken with lots of fluids.   Muscle or nerve weakness. This might be the result of a spinal cord injury, a stroke, multiple sclerosis or Parkinson's disease.   Diabetes can cause a high urine volume which fills the bladder so quickly that the normal urge to urinate is triggered very strongly.  SYMPTOMS   Loss of bladder control. You feel the need to urinate and cannot make your body wait.   Sudden, strong urges to urinate.   Urinating 8 or more times a day.   Waking up to urinate two or more times a night.  DIAGNOSIS  To decide if you have overactive bladder, your healthcare provider will probably:  Ask about symptoms you have noticed.   Ask about your overall health. This will include questions about any medications you are taking.   Do a physical examination. This will help determine if there are obvious blockages or other problems.   Order some tests. These might include:   A blood test to check for diabetes or other health issues that could be contributing to the problem.   Urine testing. This could measure the flow of urine and the pressure on the bladder.   A test of your neurological system (the brain, spinal cord and nerves). This is the system that senses the need to urinate. Some of these tests are called flow tests, bladder pressure tests and electrical measurements of the sphincter muscle.   A bladder test to check whether it is emptying completely when you urinate.   Cytoscopy. This test uses a thin tube with a tiny camera on it. It offers a look inside your urethra and bladder to see if there are problems.   Imaging tests. You might be given a contrast dye and then asked to urinate. X-rays are taken to see how your bladder is working.  TREATMENT   An overactive bladder can be treated in many ways. The treatment will depend on the cause. Whether you have a mild or severe case also makes a difference. Often, treatment can be given in your healthcare provider's office or clinic. Be sure to discuss the different options with your caregiver. They include:  Behavioral treatments. These do not involve medication or surgery:   Bladder training. For this, you would follow a schedule to urinate at regular intervals. This helps you learn to control the urge to urinate. At first, you might be asked to wait a few minutes after feeling the urge. In time, you should be able to schedule bathroom visits an hour or more apart.   Kegel exercises. These exercises strengthen the pelvic floor muscles, which support the  bladder. By toning these muscles, they can help control urination, even if the bladder muscles are overactive. A specialist will teach you how to do these exercises correctly. They will require daily practice.   Weight loss. If you are obese or overweight, losing weight might stop your bladder from being overactive. Talk to your healthcare provider about how many pounds you should lose. Also ask if there is a specific program or method that would work best for you.   Diet change. This might be suggested if constipation is making your overactive bladder worse. Your healthcare provider or a nutritionist can explain ways to change what you eat to ease constipation. Other people might need to take in less caffeine or alcohol. Sometimes drinking fewer fluids is needed, too.   Protection. This is not an actual treatment. But, you could wear special pads to take care of any leakage while you wait for other treatments to take effect. This will help you avoid embarrassment.   Physical treatments.   Electrical stimulation. Electrodes will send gentle pulses to the nerves or muscles that help control the bladder. The goal is to strengthen them. Sometimes this  is done with the electrodes outside of the body. Or, they might be placed inside the body (implanted). This treatment can take several months to have an effect.   Medications. These are usually used along with other treatments. Several medicines are available. Some are injected into the muscles involved in urination. Others come in pill form. Medications sometimes prescribed include:   Anticholinergics. These drugs block the signals that the nerves deliver to the bladder. This keeps it from releasing urine at the wrong time. Researchers think the drugs might help in other ways, too.   Imipramine. This is an antidepressant. But, it relaxes bladder muscles.   Botox. This is still experimental. Some people believe that injecting it into the bladder muscles will relax them so they work more normally. It has also been injected into the sphincter muscle when the sphincter muscle does not open properly. This is a temporary fix, however. Also, it might make matters worse, especially in older people.   Surgery.   A device might be implanted to help manage your nerves. It works on the nerves that signal when you need to urinate.   Surgery is sometimes needed with electrical stimulation. If the electrodes are implanted, this is done through surgery.   Sometimes repairs need to be made through surgery. For example, the size of the bladder can be changed. This is usually done in severe cases only.  HOME CARE INSTRUCTIONS   Take any medications your healthcare provider prescribed or suggested. Follow the directions carefully.   Practice any lifestyle changes that are recommended. These might include:   Drinking less fluid or drinking at different times of the day. If you need to urinate often during the night, for example, you may need to stop drinking fluids early in the evening.   Cutting down on caffeine or alcohol. They can both make an overactive bladder worse. Caffeine is found in coffee, tea and  sodas.   Doing Kegel exercises to strengthen muscles.   Losing weight, if that is recommended.   Eating a healthy and balanced diet. This will help you avoid constipation.   Keep a journal or a log. You might be asked to record how much you drink and when, and also when you feel the need to urinate.   Learn how to care for implants or other devices,  such as pessaries.  SEEK MEDICAL CARE IF:   Your overactive bladder gets worse.   You feel increased pain or irritation when you urinate.   You notice blood in your urine.   You have questions about any medications or devices that your healthcare provider recommended.   You notice blood, pus or swelling at the site of any test or treatment procedure.   You have an oral temperature above 102 F (38.9 C).  SEEK IMMEDIATE MEDICAL CARE IF:  You have an oral temperature above 102 F (38.9 C), not controlled by medicine. Document Released: 01/25/2009 Document Revised: 03/20/2011 Document Reviewed: 01/25/2009 [facility] Patient Information 2012 [place], [place].

## 2011-06-18 NOTE — Progress Notes (Signed)
Sandra Roy [birth year] [id number]   History:    56 y.o.  for annual exam with her major complaint only of weight gain urinary frequency and nocturia. Patient with known autoimmune thyroiditis (Hashimoto's) has been followed by Dr. Lurene Shadow endocrinologist for which patient scheduled to see next month. Patient is currently on split regimen of Armour Thyroid consisting of 3 of the 30 milligram tablets which she takes Monday through Thursday and takes 2 of the 30 mg tablets Friday 2 Sunday. Patient's last colonoscopy was in 2006 and her last mammogram was normal May 2012. Patient does her monthly self breast examination. Last bone density study in 2012 with her lowest T score the [facility] with a value of -1.2 (decreased bone mineralization). Patient with past history of TAH BSO. Patient denies any vasomotor symptoms. She does suffer from anxiety for which she takes Xanax 0.5 mg when necessary. Patient with known history of Leiden V Factor Mutation  Past medical history,surgical history, family history and social history were all reviewed and documented in the EPIC chart.  Gynecologic History No LMP recorded. Contraception: none Last Pap: 2011. Results were: normal Last mammogram: 2012. Results were: normal  Obstetric History OB History    Grav Para Term Preterm Abortions TAB SAB Ect Mult Living   4 3 3  1  1   3      # Outc Date GA Lbr Len/2nd Wgt Sex Del Anes PTL Lv   1 TRM     F SVD  No Yes   2 TRM     M SVD  No Yes   3 TRM     F SVD  No Yes   4 SAB                ROS:  Was performed and pertinent positives and negatives are included in the history.  Exam: chaperone present  BP 128/86  Ht 5\' 4"  (1.626 m)  Wt 198 lb (89.812 kg)  BMI 33.99 kg/m2  Body mass index is 33.99 kg/(m^2).  General appearance : Well developed well nourished female. No acute distress HEENT: Neck supple, trachea midline, no carotid bruits, no thyroidmegaly Lungs: Clear to auscultation, no rhonchi or  wheezes, or rib retractions  Heart: Regular rate and rhythm, no murmurs or gallops Breast:Examined in sitting and supine position were symmetrical in appearance, no palpable masses or tenderness,  no skin retraction, no nipple inversion, no nipple discharge, no skin discoloration, no axillary or supraclavicular lymphadenopathy Abdomen: no palpable masses or tenderness, no rebound or guarding Extremities: no edema or skin discoloration or tenderness  Pelvic:  Bartholin, Urethra, Skene Glands: Within normal limits             Vagina: No gross lesions or discharge  Cervix: Absent  Uterus absent  Adnexa  Without masses or tenderness  Anus and perineum  normal   Rectovaginal  normal sphincter tone without palpated masses or tenderness             Hemoccult cards provided for the patient to submit to the office for testing     Assessment/Plan:  56 y.o. 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 Patient states that she voids every 3-4 hours and gets up twice a night to urinate. We discussed about placing her on and anti-cholinergic such asMyrbetriq 25 mg daily. She denies any true incontinence only if her bladder is full and she has to run to the bathroom. The following labs will  be drawn today: Thyroid panel, cholesterol, CBC, urinalysis, hemoglobin A1c. Patient will make an appointment to see Dr. Lurene Shadow in the next few weeks we'll for a copy of this note and she can check with [place] for the results of the above labs will be drawn today. No Pap smear done today, patient with prior hysterectomy, new screening Pap smear guidelines discussed. She is encouraged to do her monthly self breast examination. She was encouraged to follow up with her mammogram in May. Patient to submit the fecal occult blood testing cards to the office for evaluation. She is instructed to take her calcium and vitamin D for osteoporosis prevention along with weightbearing exercises 3-4 times a  week.    [name] MD, 10:33 AM 06/18/2011

## 2011-06-19 ENCOUNTER — Other Ambulatory Visit: Payer: Self-pay

## 2011-06-19 DIAGNOSIS — R7309 Other abnormal glucose: Secondary | ICD-10-CM

## 2011-06-19 LAB — URINALYSIS W MICROSCOPIC + REFLEX CULTURE
Bacteria, UA: NONE SEEN
Bilirubin Urine: NEGATIVE
Casts: NONE SEEN
Crystals: NONE SEEN
Glucose, UA: NEGATIVE mg/dL
Hgb urine dipstick: NEGATIVE
Ketones, ur: NEGATIVE mg/dL
Nitrite: NEGATIVE
Protein, ur: NEGATIVE mg/dL
Specific Gravity, Urine: 1.015 (ref 1.005–1.030)
Squamous Epithelial / LPF: NONE SEEN
Urobilinogen, UA: 0.2 mg/dL (ref 0.0–1.0)
pH: 6 (ref 5.0–8.0)

## 2011-06-19 LAB — HEMOGLOBIN A1C
Hgb A1c MFr Bld: 6.1 % — ABNORMAL HIGH (ref <5.7)
Mean Plasma Glucose: 128 mg/dL — ABNORMAL HIGH (ref <117)

## 2011-06-20 ENCOUNTER — Other Ambulatory Visit: Payer: PRIVATE HEALTH INSURANCE

## 2011-06-20 ENCOUNTER — Telehealth: Payer: Self-pay | Admitting: Gynecology

## 2011-06-20 ENCOUNTER — Other Ambulatory Visit: Payer: Self-pay | Admitting: Gynecology

## 2011-06-20 DIAGNOSIS — R7309 Other abnormal glucose: Secondary | ICD-10-CM

## 2011-06-20 LAB — GLUCOSE, RANDOM: Glucose, Bld: 89 mg/dL (ref 70–99)

## 2011-06-20 MED ORDER — NITROFURANTOIN MONOHYD MACRO 100 MG PO CAPS: 100.0000 mg | ORAL_CAPSULE | Freq: Two times a day (BID) | ORAL | Status: AC

## 2011-06-20 NOTE — Telephone Encounter (Signed)
Patient will be notified this afternoon that her urine culture demonstrated greater than 100,000 Escherichia coli. A prescription for Macrobid 100 mg to take 1 by mouth twice a day for 7 days will be prescribed.

## 2011-06-21 LAB — URINE CULTURE: Colony Count: >=100000

## 2012-01-06 ENCOUNTER — Encounter: Payer: Self-pay | Admitting: Gynecology

## 2012-06-21 ENCOUNTER — Ambulatory Visit (INDEPENDENT_AMBULATORY_CARE_PROVIDER_SITE_OTHER): Payer: BC Managed Care – PPO | Admitting: Gynecology

## 2012-06-21 ENCOUNTER — Encounter: Payer: Self-pay | Admitting: Gynecology

## 2012-06-21 VITALS — BP 126/74 | Ht 63.75 in | Wt 187.0 lb

## 2012-06-21 DIAGNOSIS — D6851 Activated protein C resistance: Secondary | ICD-10-CM

## 2012-06-21 DIAGNOSIS — Z780 Asymptomatic menopausal state: Secondary | ICD-10-CM

## 2012-06-21 DIAGNOSIS — R8290 Unspecified abnormal findings in urine: Secondary | ICD-10-CM

## 2012-06-21 DIAGNOSIS — D6859 Other primary thrombophilia: Secondary | ICD-10-CM

## 2012-06-21 DIAGNOSIS — R82998 Other abnormal findings in urine: Secondary | ICD-10-CM

## 2012-06-21 DIAGNOSIS — Z01419 Encounter for gynecological examination (general) (routine) without abnormal findings: Secondary | ICD-10-CM

## 2012-06-21 LAB — URINALYSIS W MICROSCOPIC + REFLEX CULTURE
Bilirubin Urine: NEGATIVE
Casts: NONE SEEN
Crystals: NONE SEEN
Glucose, UA: NEGATIVE mg/dL
Hgb urine dipstick: NEGATIVE
Ketones, ur: NEGATIVE mg/dL
Nitrite: NEGATIVE
Protein, ur: NEGATIVE mg/dL
RBC / HPF: NONE SEEN RBC/hpf (ref <3)
Specific Gravity, Urine: <1.005 — ABNORMAL LOW (ref 1.005–1.030)
Urobilinogen, UA: 0.2 mg/dL (ref 0.0–1.0)
pH: 6 (ref 5.0–8.0)

## 2012-06-21 NOTE — Patient Instructions (Addendum)
Tetanus, Diphtheria, Pertussis (Tdap) Vaccine What You Need to Know WHY GET VACCINATED? Tetanus, diphtheria and pertussis can be very serious diseases, even for adolescents and adults. Tdap vaccine can protect us from these diseases. TETANUS (Lockjaw) causes painful muscle tightening and stiffness, usually all over the body.  It can lead to tightening of muscles in the head and neck so you can't open your mouth, swallow, or sometimes even breathe. Tetanus kills about 1 out of 5 people who are infected. DIPHTHERIA can cause a thick coating to form in the back of the throat.  It can lead to breathing problems, paralysis, heart failure, and death. PERTUSSIS (Whooping Cough) causes severe coughing spells, which can cause difficulty breathing, vomiting and disturbed sleep.  It can also lead to weight loss, incontinence, and rib fractures. Up to 2 in 100 adolescents and 5 in 100 adults with pertussis are hospitalized or have complications, which could include pneumonia and death. These diseases are caused by bacteria. Diphtheria and pertussis are spread from person to person through coughing or sneezing. Tetanus enters the body through cuts, scratches, or wounds. Before vaccines, the [place] saw as many as 200,000 cases a year of diphtheria and pertussis, and hundreds of cases of tetanus. Since vaccination began, tetanus and diphtheria have dropped by about 99% and pertussis by about 80%. TDAP VACCINE Tdap vaccine can protect adolescents and adults from tetanus, diphtheria, and pertussis. One dose of Tdap is routinely given at age 11 or 12. People who did not get Tdap at that age should get it as soon as possible. Tdap is especially important for health care professionals and anyone having close contact with a baby younger than 12 months. Pregnant women should get a dose of Tdap during every pregnancy, to protect the newborn from pertussis. Infants are most at risk for severe, life-threatening  complications from pertussis. A similar vaccine, called Td, protects from tetanus and diphtheria, but not pertussis. A Td booster should be given every 10 years. Tdap may be given as one of these boosters if you have not already gotten a dose. Tdap may also be given after a severe cut or burn to prevent tetanus infection. Your doctor can give you more information. Tdap may safely be given at the same time as other vaccines. SOME PEOPLE SHOULD NOT GET THIS VACCINE  If you ever had a life-threatening allergic reaction after a dose of any tetanus, diphtheria, or pertussis containing vaccine, OR if you have a severe allergy to any part of this vaccine, you should not get Tdap. Tell your doctor if you have any severe allergies.  If you had a coma, or long or multiple seizures within 7 days after a childhood dose of DTP or DTaP, you should not get Tdap, unless a cause other than the vaccine was found. You can still get Td.  Talk to your doctor if you:  have epilepsy or another nervous system problem,  had severe pain or swelling after any vaccine containing diphtheria, tetanus or pertussis,  ever had Guillain-Barr Syndrome (GBS),  aren't feeling well on the day the shot is scheduled. RISKS OF A VACCINE REACTION With any medicine, including vaccines, there is a chance of side effects. These are usually mild and go away on their own, but serious reactions are also possible. Brief fainting spells can follow a vaccination, leading to injuries from falling. Sitting or lying down for about 15 minutes can help prevent these. Tell your doctor if you feel dizzy or light-headed, or   have vision changes or ringing in the ears. Mild problems following Tdap (Did not interfere with activities)  Pain where the shot was given (about 3 in 4 adolescents or 2 in 3 adults)  Redness or swelling where the shot was given (about 1 person in 5)  Mild fever of at least 100.4F (up to about 1 in 25 adolescents or 1 in  100 adults)  Headache (about 3 or 4 people in 10)  Tiredness (about 1 person in 3 or 4)  Nausea, vomiting, diarrhea, stomach ache (up to 1 in 4 adolescents or 1 in 10 adults)  Chills, body aches, sore joints, rash, swollen glands (uncommon) Moderate problems following Tdap (Interfered with activities, but did not require medical attention)  Pain where the shot was given (about 1 in 5 adolescents or 1 in 100 adults)  Redness or swelling where the shot was given (up to about 1 in 16 adolescents or 1 in 25 adults)  Fever over 102F (about 1 in 100 adolescents or 1 in 250 adults)  Headache (about 3 in 20 adolescents or 1 in 10 adults)  Nausea, vomiting, diarrhea, stomach ache (up to 1 or 3 people in 100)  Swelling of the entire arm where the shot was given (up to about 3 in 100). Severe problems following Tdap (Unable to perform usual activities, required medical attention)  Swelling, severe pain, bleeding and redness in the arm where the shot was given (rare). A severe allergic reaction could occur after any vaccine (estimated less than 1 in a million doses). WHAT IF THERE IS A SERIOUS REACTION? What should I look for?  Look for anything that concerns you, such as signs of a severe allergic reaction, very high fever, or behavior changes. Signs of a severe allergic reaction can include hives, swelling of the face and throat, difficulty breathing, a fast heartbeat, dizziness, and weakness. These would start a few minutes to a few hours after the vaccination. What should I do?  If you think it is a severe allergic reaction or other emergency that can't wait, call 9-1-1 or get the person to the nearest hospital. Otherwise, call your doctor.  Afterward, the reaction should be reported to the "Vaccine Adverse Event Reporting System" (VAERS). Your doctor might file this report, or you can do it yourself through the VAERS web site at www.vaers.hhs.gov, or by calling 1-800-822-7967. VAERS is  only for reporting reactions. They do not give medical advice.  THE NATIONAL VACCINE INJURY COMPENSATION PROGRAM The National Vaccine Injury Compensation Program (VICP) is a federal program that was created to compensate people who may have been injured by certain vaccines. Persons who believe they may have been injured by a vaccine can learn about the program and about filing a claim by calling 1-800-338-2382 or visiting the VICP website at www.hrsa.gov/vaccinecompensation. HOW CAN I LEARN MORE?  Ask your doctor.  Call your local or state health department.  Contact the Centers for Disease Control and Prevention (CDC):  Call 1-800-232-4636 or visit CDC's website at www.cdc.gov/vaccines CDC Tdap Vaccine VIS (08/21/11) Document Released: 09/30/2011 Document Reviewed: 09/30/2011 Exit[employer] Patient Information 2013 [employer].  Shingles Vaccine What You Need to Know WHAT IS SHINGLES?  Shingles is a painful skin rash, often with blisters. It is also called Herpes Zoster or just Zoster.  A shingles rash usually appears on one side of the face or body and lasts from 2 to 4 weeks. Its main symptom is pain, which can be quite severe. Other symptoms of   shingles can include fever, headache, chills, and upset stomach. Very rarely, a shingles infection can lead to pneumonia, hearing problems, blindness, brain inflammation (encephalitis), or death.  For about 1 person in 5, severe pain can continue even after the rash clears up. This is called post-herpetic neuralgia.  Shingles is caused by the Varicella Zoster virus. This is the same virus that causes chickenpox. Only someone who has had a case of chickenpox or rarely, has gotten chickenpox vaccine, can get shingles. The virus stays in your body. It can reappear many years later to cause a case of shingles.  You cannot catch shingles from another person with shingles. However, a person who has never had chickenpox (or chickenpox vaccine) could get  chickenpox from someone with shingles. This is not very common.  Shingles is far more common in people 50 and older than in younger people. It is also more common in people whose immune systems are weakened because of a disease such as cancer or drugs such as steroids or chemotherapy.  At least 1 million people get shingles per year in the [place]. SHINGLES VACCINE  A vaccine for shingles was licensed in 2006. In clinical trials, the vaccine reduced the risk of shingles by 50%. It can also reduce the pain in people who still get shingles after being vaccinated.  A single dose of shingles vaccine is recommended for adults 60 years of age and older. SOME PEOPLE SHOULD NOT GET SHINGLES VACCINE OR SHOULD WAIT A person should not get shingles vaccine if he or she:  Has ever had a life-threatening allergic reaction to gelatin, the antibiotic neomycin, or any other component of shingles vaccine. Tell your caregiver if you have any severe allergies.  Has a weakened immune system because of current:  AIDS or another disease that affects the immune system.  Treatment with drugs that affect the immune system, such as prolonged use of high-dose steroids.  Cancer treatment, such as radiation or chemotherapy.  Cancer affecting the bone marrow or lymphatic system, such as leukemia or lymphoma.  Is pregnant, or might be pregnant. Women should not become pregnant until at least 4 weeks after getting shingles vaccine. Someone with a minor illness, such as a cold, may be vaccinated. Anyone with a moderate or severe acute illness should usually wait until he or she recovers before getting the vaccine. This includes anyone with a temperature of 101.3 F (38 C) or higher. WHAT ARE THE RISKS FROM SHINGLES VACCINE?  A vaccine, like any medicine, could possibly cause serious problems, such as severe allergic reactions. However, the risk of a vaccine causing serious harm, or death, is extremely  small.  No serious problems have been identified with shingles vaccine. Mild Problems  Redness, soreness, swelling, or itching at the site of the injection (about 1 person in 3).  Headache (about 1 person in 70). Like all vaccines, shingles vaccine is being closely monitored for unusual or severe problems. WHAT IF THERE IS A MODERATE OR SEVERE REACTION? What should I look for? Any unusual condition, such as a severe allergic reaction or a high fever. If a severe allergic reaction occurred, it would be within a few minutes to an hour after the shot. Signs of a serious allergic reaction can include difficulty breathing, weakness, hoarseness or wheezing, a fast heartbeat, hives, dizziness, paleness, or swelling of the throat. What should I do?  Call your caregiver, or get the person to a caregiver right away.  Tell the caregiver what happened,   the date and time it happened, and when the vaccination was given.  Ask the caregiver to report the reaction by filing a Vaccine Adverse Event Reporting System (VAERS) form. Or, you can file this report through the VAERS web site at www.vaers.hhs.gov or by calling 1-800-822-7967. VAERS does not provide medical advice. HOW CAN I LEARN MORE?  Ask your caregiver. He or she can give you the vaccine package insert or suggest other sources of information.  Contact the Centers for Disease Control and Prevention (CDC):  Call 1-800-232-4636 (1-800-CDC-INFO).  Visit the CDC website at www.cdc.gov/vaccines CDC Shingles Vaccine VIS (01/18/08) Document Released: 01/26/2006 Document Revised: 06/23/2011 Document Reviewed: 01/18/2008 ExitCare Patient Information 2013 [employer].   

## 2012-06-21 NOTE — Progress Notes (Signed)
Sandra Roy 1955-09-17 [id number]   History:    57 y.o.  for annual gyn exam with the only complaining of foul-smelling urine. Patient had her labs drawn recently by Dr. Lurene Shadow who has been monitor her hypothyroidism. (Hashimoto's thyroiditis). Patient with prior history of total abdominal hysterectomy bilateral salpingo-oophorectomy. Patient on no hormone replacement therapy. Review of patient's record indicated she had a colonoscopy in 2006 with benign polyps noted. Her last bone density study was 2 years ago with her lowest T score of -1.2 at the [facility]. Patient has not received a Tdap or shingles vaccine as of yet.  Patient with history of Leiden factor V mutation  Past medical history,surgical history, family history and social history were all reviewed and documented in the EPIC chart.  Gynecologic History No LMP recorded. Patient has had a hysterectomy. Contraception: status post hysterectomy Last Pap: 2011. Results were: normal Last mammogram: 2013. Results were: normal  Obstetric History OB History   Grav Para Term Preterm Abortions TAB SAB Ect Mult Living   4 3 3  1  1   3      # Outc Date GA Lbr Len/2nd Wgt Sex Del Anes PTL Lv   1 TRM     F SVD  No Yes   2 TRM     M SVD  No Yes   3 TRM     F SVD  No Yes   4 SAB                ROS: A ROS was performed and pertinent positives and negatives are included in the history.  GENERAL: No fevers or chills. HEENT: No change in vision, no earache, sore throat or sinus congestion. NECK: No pain or stiffness. CARDIOVASCULAR: No chest pain or pressure. No palpitations. PULMONARY: No shortness of breath, cough or wheeze. GASTROINTESTINAL: No abdominal pain, nausea, vomiting or diarrhea, melena or bright red blood per rectum. GENITOURINARY: No urinary frequency, urgency, hesitancy or dysuria. MUSCULOSKELETAL: No joint or muscle pain, no back pain, no recent trauma. DERMATOLOGIC: No rash, no itching, no lesions. ENDOCRINE: No polyuria,  polydipsia, no heat or cold intolerance. No recent change in weight. HEMATOLOGICAL: No anemia or easy bruising or bleeding. NEUROLOGIC: No headache, seizures, numbness, tingling or weakness. PSYCHIATRIC: No depression, no loss of interest in normal activity or change in sleep pattern.     Exam: chaperone present  BP 126/74  Ht 5' 3.75" (1.619 m)  Wt 187 lb (84.823 kg)  BMI 32.36 kg/m2  Body mass index is 32.36 kg/(m^2).  General appearance : Well developed well nourished female. No acute distress HEENT: Neck supple, trachea midline, no carotid bruits, no thyroidmegaly Lungs: Clear to auscultation, no rhonchi or wheezes, or rib retractions  Heart: Regular rate and rhythm, no murmurs or gallops Breast:Examined in sitting and supine position were symmetrical in appearance, no palpable masses or tenderness,  no skin retraction, no nipple inversion, no nipple discharge, no skin discoloration, no axillary or supraclavicular lymphadenopathy Abdomen: no palpable masses or tenderness, no rebound or guarding Extremities: no edema or skin discoloration or tenderness  Pelvic:  Bartholin, Urethra, Skene Glands: Within normal limits             Vagina: No gross lesions or discharge  Cervix: absent  Uterus  Absent  Adnexa  Without masses or tenderness  Anus and perineum  normal   Rectovaginal  normal sphincter tone without palpated masses or tenderness  Hemoccult Cards provided     Assessment/Plan:  57 y.o. female for annual exam with no major complaints. Patient's urine today demonstrated 3-6 WBC and rare bacteria. Culture pending at time of this dictation. Patient was reminded to do her monthly self breast examination. The new Pap smear screening guidelines discussed. No Pap smear done today. Tdap and shingles vaccine literature information provided. Patient received Tdap vaccine today. Prescription for shingles vaccine was provided. She was scheduled for bone density study next few  weeks. Patient will contact her gastroenterologist for overdue colonoscopy. She was reminded to submit to the office the Hemoccult cards for testing. We discussed importance of calcium and vitamin D and regular exercise for osteoporosis prevention.    [name] MD, 11:04 AM 06/21/2012

## 2012-06-23 LAB — URINE CULTURE
Colony Count: NO GROWTH
Organism ID, Bacteria: NO GROWTH

## 2012-06-25 ENCOUNTER — Other Ambulatory Visit: Payer: Self-pay | Admitting: Anesthesiology

## 2012-06-25 DIAGNOSIS — Z1211 Encounter for screening for malignant neoplasm of colon: Secondary | ICD-10-CM

## 2012-06-30 ENCOUNTER — Other Ambulatory Visit: Payer: Self-pay | Admitting: *Deleted

## 2012-06-30 DIAGNOSIS — Z1211 Encounter for screening for malignant neoplasm of colon: Secondary | ICD-10-CM

## 2012-08-23 ENCOUNTER — Other Ambulatory Visit: Payer: Self-pay | Admitting: Gynecology

## 2012-08-24 NOTE — Telephone Encounter (Signed)
RX called in [facility] 

## 2013-03-04 ENCOUNTER — Other Ambulatory Visit: Payer: Self-pay | Admitting: Endocrinology

## 2013-03-04 DIAGNOSIS — E049 Nontoxic goiter, unspecified: Secondary | ICD-10-CM

## 2013-03-23 ENCOUNTER — Ambulatory Visit
Admission: RE | Admit: 2013-03-23 | Discharge: 2013-03-23 | Disposition: A | Discharge status: Home / Self Care | Payer: BC Managed Care – PPO | Source: Ambulatory Visit | Attending: Endocrinology | Admitting: Endocrinology

## 2013-03-23 DIAGNOSIS — E049 Nontoxic goiter, unspecified: Secondary | ICD-10-CM

## 2013-03-23 DIAGNOSIS — E041 Nontoxic single thyroid nodule: Secondary | ICD-10-CM

## 2013-03-23 HISTORY — DX: Nontoxic single thyroid nodule: E04.1

## 2013-03-30 ENCOUNTER — Encounter: Payer: Self-pay | Admitting: Women's Health

## 2013-03-30 ENCOUNTER — Other Ambulatory Visit: Payer: Self-pay | Admitting: Women's Health

## 2013-03-30 ENCOUNTER — Ambulatory Visit (INDEPENDENT_AMBULATORY_CARE_PROVIDER_SITE_OTHER): Payer: BC Managed Care – PPO | Admitting: Women's Health

## 2013-03-30 DIAGNOSIS — R300 Dysuria: Secondary | ICD-10-CM

## 2013-03-30 LAB — URINALYSIS W MICROSCOPIC + REFLEX CULTURE
Bilirubin Urine: NEGATIVE
Casts: NONE SEEN
Crystals: NONE SEEN
Glucose, UA: NEGATIVE mg/dL
Ketones, ur: NEGATIVE mg/dL
Nitrite: NEGATIVE
Protein, ur: NEGATIVE mg/dL
RBC / HPF: NONE SEEN RBC/hpf (ref <3)
Specific Gravity, Urine: <1.005 — ABNORMAL LOW (ref 1.005–1.030)
Urobilinogen, UA: 0.2 mg/dL (ref 0.0–1.0)
pH: 5 (ref 5.0–8.0)

## 2013-03-30 MED ORDER — CIPROFLOXACIN HCL 250 MG PO TABS: 250.0000 mg | ORAL_TABLET | Freq: Two times a day (BID) | ORAL | Status: DC

## 2013-03-30 NOTE — Patient Instructions (Signed)
Urinary Tract Infection  Urinary tract infections (UTIs) can develop anywhere along your urinary tract. Your urinary tract is your body's drainage system for removing wastes and extra water. Your urinary tract includes two kidneys, two ureters, a bladder, and a urethra. Your kidneys are a pair of bean-shaped organs. Each kidney is about the size of your fist. They are located below your ribs, one on each side of your spine.  CAUSES  Infections are caused by microbes, which are microscopic organisms, including fungi, viruses, and bacteria. These organisms are so small that they can only be seen through a microscope. Bacteria are the microbes that most commonly cause UTIs.  SYMPTOMS   Symptoms of UTIs may vary by age and gender of the patient and by the location of the infection. Symptoms in Sandra Roy women typically include a frequent and intense urge to urinate and a painful, burning feeling in the bladder or urethra during urination. Older women and men are more likely to be tired, shaky, and weak and have muscle aches and abdominal pain. A fever may mean the infection is in your kidneys. Other symptoms of a kidney infection include pain in your back or sides below the ribs, nausea, and vomiting.  DIAGNOSIS  To diagnose a UTI, your caregiver will ask you about your symptoms. Your caregiver also will ask to provide a urine sample. The urine sample will be tested for bacteria and white blood cells. White blood cells are made by your body to help fight infection.  TREATMENT   Typically, UTIs can be treated with medication. Because most UTIs are caused by a bacterial infection, they usually can be treated with the use of antibiotics. The choice of antibiotic and length of treatment depend on your symptoms and the type of bacteria causing your infection.  HOME CARE INSTRUCTIONS   If you were prescribed antibiotics, take them exactly as your caregiver instructs you. Finish the medication even if you feel better after you  have only taken some of the medication.   Drink enough water and fluids to keep your urine clear or pale yellow.   Avoid caffeine, tea, and carbonated beverages. They tend to irritate your bladder.   Empty your bladder often. Avoid holding urine for long periods of time.   Empty your bladder before and after sexual intercourse.   After a bowel movement, women should cleanse from front to back. Use each tissue only once.  SEEK MEDICAL CARE IF:    You have back pain.   You develop a fever.   Your symptoms do not begin to resolve within 3 days.  SEEK IMMEDIATE MEDICAL CARE IF:    You have severe back pain or lower abdominal pain.   You develop chills.   You have nausea or vomiting.   You have continued burning or discomfort with urination.  MAKE SURE YOU:    Understand these instructions.   Will watch your condition.   Will get help right away if you are not doing well or get worse.  Document Released: 01/08/2005 Document Revised: 09/30/2011 Document Reviewed: 05/09/2011  ExitCare Patient Information 2014 [employer].

## 2013-03-30 NOTE — Progress Notes (Signed)
Patient ID: Sandra Roy, female   DOB: 02-28-1956, 57 y.o.   MRN: [id number] Presents with increased urinary urgency, frequency bladder pressure for 2 days. States slight nausea, no vomiting or back pain. Denies discharge. Hysterectomy. States usually has one UTI per year.  Exam: Appears well. UA: Trace leukocytes, 3-6 WBCs. No CVAT.  Probable UTI  In: Cipro 250 twice daily for 3 days #6 prescription, proper use given and reviewed. Culture pending. Instructed to call if no relief of symptoms.

## 2013-04-01 LAB — URINE CULTURE: Colony Count: 30000

## 2013-06-19 ENCOUNTER — Encounter (HOSPITAL_COMMUNITY): Payer: Self-pay | Admitting: Emergency Medicine

## 2013-06-19 ENCOUNTER — Emergency Department (HOSPITAL_COMMUNITY)
Admission: EM | Admit: 2013-06-19 | Discharge: 2013-06-19 | Disposition: A | Discharge status: Home / Self Care | Payer: BC Managed Care – PPO | Attending: Emergency Medicine | Admitting: Emergency Medicine

## 2013-06-19 DIAGNOSIS — Z8639 Personal history of other endocrine, nutritional and metabolic disease: Secondary | ICD-10-CM | POA: Insufficient documentation

## 2013-06-19 DIAGNOSIS — T50905A Adverse effect of unspecified drugs, medicaments and biological substances, initial encounter: Secondary | ICD-10-CM

## 2013-06-19 DIAGNOSIS — F329 Major depressive disorder, single episode, unspecified: Secondary | ICD-10-CM | POA: Insufficient documentation

## 2013-06-19 DIAGNOSIS — E063 Autoimmune thyroiditis: Secondary | ICD-10-CM | POA: Insufficient documentation

## 2013-06-19 DIAGNOSIS — T40605A Adverse effect of unspecified narcotics, initial encounter: Secondary | ICD-10-CM | POA: Insufficient documentation

## 2013-06-19 DIAGNOSIS — Z862 Personal history of diseases of the blood and blood-forming organs and certain disorders involving the immune mechanism: Secondary | ICD-10-CM | POA: Insufficient documentation

## 2013-06-19 DIAGNOSIS — Z87891 Personal history of nicotine dependence: Secondary | ICD-10-CM | POA: Insufficient documentation

## 2013-06-19 DIAGNOSIS — T40601A Poisoning by unspecified narcotics, accidental (unintentional), initial encounter: Secondary | ICD-10-CM | POA: Insufficient documentation

## 2013-06-19 DIAGNOSIS — Z79899 Other long term (current) drug therapy: Secondary | ICD-10-CM | POA: Insufficient documentation

## 2013-06-19 DIAGNOSIS — Z7982 Long term (current) use of aspirin: Secondary | ICD-10-CM | POA: Insufficient documentation

## 2013-06-19 DIAGNOSIS — R064 Hyperventilation: Secondary | ICD-10-CM | POA: Insufficient documentation

## 2013-06-19 DIAGNOSIS — Z8739 Personal history of other diseases of the musculoskeletal system and connective tissue: Secondary | ICD-10-CM | POA: Insufficient documentation

## 2013-06-19 DIAGNOSIS — F3289 Other specified depressive episodes: Secondary | ICD-10-CM | POA: Insufficient documentation

## 2013-06-19 MED ORDER — ONDANSETRON 8 MG PO TBDP
8.0000 mg | ORAL_TABLET | Freq: Once | ORAL | Status: AC
  Administered 2013-06-19: 8 mg via ORAL
  Filled 2013-06-19: qty 1

## 2013-06-19 NOTE — Discharge Instructions (Signed)
Hyperventilation °Hyperventilation is breathing that is deeper and more rapid than normal. It is usually associated with panic and anxiety. Hyperventilation can make you feel breathless. It is sometimes called overbreathing. Breathing out too much causes a decrease in the amount of carbon dioxide gas in the blood. This leads to tingling and numbness in the hands, feet, and around the mouth. If this continues, your fingers, hands, and toes may begin to spasm. Hyperventilation usually lasts 20 30 minutes and can be associated with other symptoms of panic and anxiety, including:  °· Chest pains or tightness. °· A pounding or irregular, racing heartbeat (palpitations). °· Dizziness. °· Lightheadedness. °· Dry mouth. °· Weakness. °· Confusion. °· Sleep disturbance. °CAUSES  °Sudden onset (acute) hyperventilation is usually triggered by acute stress, anxiety, or emotional upset. Long-term (chronic) and recurring hyperventilation can occur with chronic lung problems, such emphysema or asthma. Other causes include:  °· Nervousness. °· Stress. °· Stimulant, drug, or alcohol use. °· Lung disease. °· Infections, such as pneumonia. °· Heart problems. °· Severe pain. °· Waking from a bad dream. °· Pregnancy. °· Bleeding. °HOME CARE INSTRUCTIONS °· Learn and use breathing exercises that help you breathe from your diaphragm and abdomen. °· Practice relaxation techniques to reduce stress, such as visualization, meditation, and muscle release. °· During an attack, try breathing into a paper bag. This changes the carbon dioxide level and slows down breathing. °SEEK IMMEDIATE MEDICAL CARE IF: °· Your hyperventilation continues or gets worse. °MAKE SURE YOU: °· Understand these instructions. °· Will watch your condition. °· Will get help right away if you are not doing well or get worse. °Document Released: 03/28/2000 Document Revised: 09/30/2011 Document Reviewed: 07/10/2011 °Exit[employer]® Patient Information ©2014 [employer]. ° °

## 2013-06-19 NOTE — ED Notes (Signed)
Pt reported to ems that she had a toothache tonight and took one of her husband's hydrocodone pills for pain, shortly afterwards she ate and then she became nauseated and vomiting.  Per ems, pt was hyperventilating and was having a panic attack which then caused her hands to cramp up on her.  Pt states her toothache is better, but still having cramps in her hands

## 2013-06-19 NOTE — ED Provider Notes (Signed)
CSN: [id number]     Arrival date & time 06/19/13  2100 History  This chart was scribed for Sandra Jacobsen, MD by Zettie Pho, ED Scribe. This patient was seen in room APA07/APA07 and the patient's care was started at 9:33 PM.   Chief Complaint  Patient presents with  . Panic Attack   The history is provided by the patient and the EMS personnel. No language interpreter was used.   HPI Comments: Sandra Roy is a 58 y.o. Female with a history of anxiety who presents to the Emergency Department complaining of a panic attack onset PTA. EMS reported that the patient was hyperventilating upon their arrival. She reports an associated pain to the bilateral hands and feet with some paresthesias to the face. Patient states that she had some dental pain earlier tonight, so she took a hydrocodone and had persistent nausea with associated, multiple episodes of non-bilious, non-bloody emesis shortly afterwards. Patient states that she ate about 6-7 hours before taking the medication. Patient states that the dental pain has since resolved, but not her other symptoms. She denies fever, recent illness. Patient also has a history of factor V Leiden mutation, osteopenia, autoimmune thyroiditis, Hashimoto's disease, hypoglycemic syndrome.   Past Medical History  Diagnosis Date  . Factor V Leiden mutation   . NSVD (normal spontaneous vaginal delivery)     x3  . Depression   . Anxiety   . Osteopenia   . Autoimmune thyroiditis   . Hashimoto's disease   . Hypoglycemic syndrome    Past Surgical History  Procedure Laterality Date  . Endometrial ablation    . Pelvic laparoscopy      LSO  . Dilation and curettage of uterus    . Wrist surgery  2003    RIGHT,,, CARPAL TUNNEL  . Knee arthroscopy  2010    RIGHT  . Abdominal hysterectomy  2003    TAH/BSO  . Tubal ligation  1976   Family History  Problem Relation Age of Onset  . Diabetes Father   . Heart disease Father   . Hypertension Sister   .  Cancer Sister     LUNG  (SMOKER)  . Diabetes Brother   . Hypertension Brother    History  Substance Use Topics  . Smoking status: Former Smoker    Quit date: 06/17/1981  . Smokeless tobacco: Never Used  . Alcohol Use: No   OB History   Grav Para Term Preterm Abortions TAB SAB Ect Mult Living   4 3 3  1  1   3      Review of Systems  Constitutional: Negative for fever.  HENT: Positive for dental problem (resolved).   Gastrointestinal: Positive for nausea and vomiting.  Musculoskeletal: Positive for arthralgias and myalgias.  Psychiatric/Behavioral: The patient is nervous/anxious.       Allergies  Review of patient's allergies indicates no known allergies.  Home Medications   Current Outpatient Rx  Name  Route  Sig  Dispense  Refill  . ALPRAZolam (XANAX) 0.5 MG tablet   Oral   Take 0.5 mg by mouth at bedtime.         Marland Kitchen HYDROcodone-acetaminophen (VICODIN) 5-500 MG per tablet   Oral   Take 1 tablet by mouth once as needed for pain (May take every 6 to 8 hours as needed for pain).         Marland Kitchen thyroid (ARMOUR) 30 MG tablet   Oral   Take 60-90 mg by mouth See  admin instructions. Takes three tablets daily (90mg  total) on Mondays through Thursdays, then take two tablets daily (60mg  total) on Fridays through Sundays         . aspirin 81 MG tablet   Oral   Take 81 mg by mouth daily.         . Calcium-Vitamin D-Vitamin K (VIACTIV) [id number] MG-UNT-MCG CHEW   Oral   Chew by mouth daily.          . cholecalciferol (VITAMIN D) 1000 UNITS tablet   Oral   Take 1,000 Units by mouth daily.         . fish oil-omega-3 fatty acids 1000 MG capsule   Oral   Take 2 g by mouth daily.         Marland Kitchen loratadine (CLARITIN) 10 MG tablet   Oral   Take 10 mg by mouth daily.          Triage Vitals: BP 127/84  Pulse 72  Temp(Src) 97.7 F (36.5 C) (Oral)  Resp 24  Ht 5\' 4"  (1.626 m)  Wt 172 lb (78.019 kg)  BMI 29.51 kg/m2  SpO2 98%  Physical Exam  Nursing note and  vitals reviewed. Constitutional: She is oriented to person, place, and time. She appears well-developed and well-nourished.  Non-toxic appearance. No distress.  HENT:  Head: Normocephalic and atraumatic.  Eyes: Conjunctivae, EOM and lids are normal. Pupils are equal, round, and reactive to light.  Neck: Normal range of motion. Neck supple. No tracheal deviation present. No mass present.  Cardiovascular: Normal rate, regular rhythm and normal heart sounds.  Exam reveals no gallop.   No murmur heard. Pulmonary/Chest: Effort normal and breath sounds normal. No stridor. No respiratory distress. She has no decreased breath sounds. She has no wheezes. She has no rhonchi. She has no rales.  Abdominal: Soft. Normal appearance and bowel sounds are normal. She exhibits no distension. There is no tenderness. There is no rebound and no CVA tenderness.  Musculoskeletal: Normal range of motion. She exhibits no edema and no tenderness.  Neurological: She is alert and oriented to person, place, and time. She has normal strength. No cranial nerve deficit or sensory deficit. GCS eye subscore is 4. GCS verbal subscore is 5. GCS motor subscore is 6.  Skin: Skin is warm and dry. No abrasion and no rash noted.  Psychiatric: She has a normal mood and affect. Her speech is normal and behavior is normal.    ED Course  Procedures (including critical care time)  DIAGNOSTIC STUDIES: Oxygen Saturation is 98% on room air, normal by my interpretation.    COORDINATION OF CARE: 9:39 PM- Will order an EKG. Will order Zofran to manage symptoms. Discussed treatment plan with patient at bedside and patient verbalized agreement.     Labs Review Labs Reviewed - No data to display Imaging Review No results found.   EKG Interpretation   Date/Time:  Sunday June 19 2013 21:52:31 EDT Ventricular Rate:  70 PR Interval:  186 QRS Duration: 100 QT Interval:  402 QTC Calculation: 434 R Axis:   41 Text Interpretation:   Normal sinus rhythm Nonspecific T wave abnormality  Abnormal ECG When compared with ECG of 01-Jul-2000 12:29, No significant  change was found Confirmed by Zenia Resides  MD, Zamyiah Tino ([id number]) on 06/19/2013  9:58:15 PM      MDM   Final diagnoses:  None   Patient given Zofran for nausea here. Patient likely with medication intolerance to Vicodin being taken on empty stomach.  She then had carpal pedal spasm from the ventilation. She is back to baseline at this time will be discharged home.   I personally performed the services described in this documentation, which was scribed in my presence. The recorded information has been reviewed and is accurate.      Sandra Jacobsen, MD 06/19/13 2206

## 2013-06-22 ENCOUNTER — Encounter: Payer: Self-pay | Admitting: Gynecology

## 2013-06-22 ENCOUNTER — Ambulatory Visit (INDEPENDENT_AMBULATORY_CARE_PROVIDER_SITE_OTHER): Payer: BC Managed Care – PPO | Admitting: Gynecology

## 2013-06-22 VITALS — BP 126/82 | Ht 63.75 in | Wt 179.0 lb

## 2013-06-22 DIAGNOSIS — Z01419 Encounter for gynecological examination (general) (routine) without abnormal findings: Secondary | ICD-10-CM

## 2013-06-22 DIAGNOSIS — F411 Generalized anxiety disorder: Secondary | ICD-10-CM

## 2013-06-22 DIAGNOSIS — M8580 Other specified disorders of bone density and structure, unspecified site: Secondary | ICD-10-CM

## 2013-06-22 DIAGNOSIS — M899 Disorder of bone, unspecified: Secondary | ICD-10-CM

## 2013-06-22 DIAGNOSIS — IMO0002 Reserved for concepts with insufficient information to code with codable children: Secondary | ICD-10-CM

## 2013-06-22 DIAGNOSIS — N952 Postmenopausal atrophic vaginitis: Secondary | ICD-10-CM | POA: Insufficient documentation

## 2013-06-22 DIAGNOSIS — M949 Disorder of cartilage, unspecified: Secondary | ICD-10-CM

## 2013-06-22 DIAGNOSIS — F419 Anxiety disorder, unspecified: Secondary | ICD-10-CM

## 2013-06-22 MED ORDER — ALPRAZOLAM 0.5 MG PO TABS: 0.5000 mg | ORAL_TABLET | Freq: Every evening | ORAL | Status: DC | PRN

## 2013-06-22 MED ORDER — NONFORMULARY OR COMPOUNDED ITEM: Status: DC

## 2013-06-22 NOTE — Patient Instructions (Signed)

## 2013-06-22 NOTE — Progress Notes (Signed)
Sandra Roy [birth year] [id number]   History:    58 y.o.  for annual gyn exam with the only complaint being of vaginal dryness and discomfort during intercourse. Patient with known autoimmune thyroiditis (Hashimoto's) has been followed by Dr. Bubba Camp endocrinologist for which patient scheduled to see next month. Patient is currently on split regimen of Armour Thyroid consisting of 3 of the 30 milligram tablets which she takes Monday through Thursday and takes 2 of the 30 mg tablets Friday 2 Sunday. Patient's last colonoscopy was in 2006 which was normal.Last bone density study in 2012 with her lowest T score the [facility] with a value of -1.2 (decreased bone mineralization). Patient with past history of TAH BSO. Patient denies any vasomotor symptoms. She does suffer from anxiety for which she takes Xanax 0.5 mg when necessary. Patient with known history of Leiden V Factor Mutation.  Dr. Bubba Camp has been doing her blood work.    Past medical history,surgical history, family history and social history were all reviewed and documented in the EPIC chart.  Gynecologic History No LMP recorded. Patient has had a hysterectomy. Contraception: status post hysterectomy Last Pap: 2011. Results were: normal Last mammogram: 2015. Results were: We do not have the report the patient states that she was informed and that it was benign  Obstetric History OB History  Gravida Para Term Preterm AB SAB TAB Ectopic Multiple Living  4 3 3  1 1    3     # Outcome Date GA Lbr Len/2nd Weight Sex Delivery Anes PTL Lv  4 SAB           3 TRM     F SVD  N Y  2 TRM     M SVD  N Y  1 TRM     F SVD  N Y       ROS: A ROS was performed and pertinent positives and negatives are included in the history.  GENERAL: No fevers or chills. HEENT: No change in vision, no earache, sore throat or sinus congestion. NECK: No pain or stiffness. CARDIOVASCULAR: No chest pain or pressure. No palpitations. PULMONARY: No shortness  of breath, cough or wheeze. GASTROINTESTINAL: No abdominal pain, nausea, vomiting or diarrhea, melena or bright red blood per rectum. GENITOURINARY: No urinary frequency, urgency, hesitancy or dysuria. MUSCULOSKELETAL: No joint or muscle pain, no back pain, no recent trauma. DERMATOLOGIC: No rash, no itching, no lesions. ENDOCRINE: No polyuria, polydipsia, no heat or cold intolerance. No recent change in weight. HEMATOLOGICAL: No anemia or easy bruising or bleeding. NEUROLOGIC: No headache, seizures, numbness, tingling or weakness. PSYCHIATRIC: No depression, no loss of interest in normal activity or change in sleep pattern.     Exam: chaperone present  BP 126/82  Ht 5' 3.75" (1.619 m)  Wt 179 lb (81.194 kg)  BMI 30.98 kg/m2  Body mass index is 30.98 kg/(m^2).  General appearance : Well developed well nourished female. No acute distress HEENT: Neck supple, trachea midline, no carotid bruits, no thyroidmegaly Lungs: Clear to auscultation, no rhonchi or wheezes, or rib retractions  Heart: Regular rate and rhythm, no murmurs or gallops Breast:Examined in sitting and supine position were symmetrical in appearance, no palpable masses or tenderness,  no skin retraction, no nipple inversion, no nipple discharge, no skin discoloration, no axillary or supraclavicular lymphadenopathy Abdomen: no palpable masses or tenderness, no rebound or guarding Extremities: no edema or skin discoloration or tenderness  Pelvic:  Bartholin, Urethra, Skene Glands: Within normal  limits             Vagina: No gross lesions or discharge, atrophic changes  Cervix: Absent  Uterus absent  Adnexa  Without masses or tenderness  Anus and perineum  normal   Rectovaginal  normal sphincter tone without palpated masses or tenderness             Hemoccult card provided     Assessment/Plan:  58 y.o. female for annual exam with vaginal atrophy. Patient will be started on low-dose estrogen vaginal to apply twice a week. We  discussed the risks benefits and pros and cons. PCP doing her blood work. Pap smear not done today in accordance to the new guidelines. Patient will schedule her bone density study. We discussed importance of calcium and vitamin D regular exercise for osteoporosis prevention. Patient was weighing 198 pounds last year and now is weighing 179 pounds attributed to healthier 78 and regular exercise.  Note: This dictation was prepared with  Dragon/digital dictation along withSmart phrase technology. Any transcriptional errors that result from this process are unintentional.   Terrance Mass MD, 10:54 AM 06/22/2013

## 2013-07-18 ENCOUNTER — Other Ambulatory Visit: Payer: BC Managed Care – PPO | Admitting: Anesthesiology

## 2013-07-18 DIAGNOSIS — Z1211 Encounter for screening for malignant neoplasm of colon: Secondary | ICD-10-CM

## 2013-07-21 ENCOUNTER — Ambulatory Visit (INDEPENDENT_AMBULATORY_CARE_PROVIDER_SITE_OTHER): Payer: BC Managed Care – PPO

## 2013-07-21 ENCOUNTER — Other Ambulatory Visit: Payer: Self-pay | Admitting: Gynecology

## 2013-07-21 DIAGNOSIS — M8580 Other specified disorders of bone density and structure, unspecified site: Secondary | ICD-10-CM

## 2013-07-21 DIAGNOSIS — Z780 Asymptomatic menopausal state: Secondary | ICD-10-CM

## 2013-07-21 DIAGNOSIS — M949 Disorder of cartilage, unspecified: Secondary | ICD-10-CM

## 2013-07-21 DIAGNOSIS — M899 Disorder of bone, unspecified: Secondary | ICD-10-CM

## 2013-07-28 ENCOUNTER — Ambulatory Visit (INDEPENDENT_AMBULATORY_CARE_PROVIDER_SITE_OTHER): Payer: BC Managed Care – PPO | Admitting: Medical

## 2013-07-28 ENCOUNTER — Encounter: Payer: Self-pay | Admitting: Medical

## 2013-07-28 VITALS — BP 130/78 | HR 88 | Temp 98.1°F | Resp 20 | Ht 64.0 in | Wt 182.0 lb

## 2013-07-28 DIAGNOSIS — J45909 Unspecified asthma, uncomplicated: Secondary | ICD-10-CM

## 2013-07-28 DIAGNOSIS — J209 Acute bronchitis, unspecified: Secondary | ICD-10-CM

## 2013-07-28 MED ORDER — PREDNISONE 20 MG PO TABS: ORAL_TABLET | ORAL | Status: DC

## 2013-07-28 MED ORDER — AZITHROMYCIN 250 MG PO TABS: ORAL_TABLET | ORAL | Status: DC

## 2013-07-28 NOTE — Progress Notes (Signed)
Subjective:  Sandra Roy is a 58 y.o. female who presents as a new patient.  Was seeing internal medicine prior.    Symptoms include 4 day hx/o nonstop cough, yesterday started getting stuffy nose but neighbor was burning stuff outside.  Has headache from all the coughing, chest congestion.   Has some sore throat, post nasal drainage, ears feel plugged.  yesterday felt some wheezing and SOB.  Has had some chills.  Denies fever, NVD.  Treatment to date: zyrtec D, promethazine cough syrup.  Husband and granddaughter have both been sick with similar.   She does not smoke.   She does have a history of bronchitis.   In the past with bronchitis, usually takes zpak or antibiotic.   Has never used albuterol inhaler.  No hx/o asthma or COPD.  Has had lots of second hand smoke in the past.  No other aggravating or relieving factors.  No other c/o.  The following portions of the patient's history were reviewed and updated as appropriate: allergies, current medications, past family history, past medical history, past social history, past surgical history and problem list.  ROS as in subjective  Past Medical History  Diagnosis Date  . Factor V Leiden mutation   . NSVD (normal spontaneous vaginal delivery)     x3  . Depression   . Anxiety   . Osteopenia   . Autoimmune thyroiditis   . Hashimoto's disease   . Hypoglycemic syndrome      Objective: BP 130/78  Pulse 88  Temp(Src) 98.1 F (36.7 C) (Oral)  Resp 20  Ht 5\' 4"  (1.626 m)  Wt 182 lb (82.555 kg)  BMI 31.22 kg/m2   General appearance: Alert, WD/WN, no distress                             Skin: warm, no rash, no diaphoresis                           Head: no sinus tenderness                            Eyes: conjunctiva normal, corneas clear, PERRLA                            Ears:mild bilat serous effusion behind TMs, external ear canals normal                          Nose: septum midline, turbinates swollen, with erythema and  clear discharge             Mouth/throat: MMM, tongue normal, mild pharyngeal erythema                           Neck: supple, no adenopathy, no thyromegaly, nontender                          Heart: RRR, normal S1, S2, no murmurs                         Lungs: +bronchial breath sounds, no rhonchi, no wheezes, no rales                Extremities: no  edema, nontender     Assessment: Encounter Diagnoses  Name Primary?  . Reactive airway disease Yes  . Acute bronchitis       Plan:  Medication orders today include: Zpak, 3 days short course prednisone, c/t promethazine DM syrup she has at home.  Discussed diagnosis and treatment of bronchitis.  Call/return in 2-3 days if symptoms are worse or not improving.  Advised that cough may linger even after the infection is improved.   Consider PFTs.

## 2013-07-28 NOTE — Patient Instructions (Signed)
Thank you for giving me the opportunity to serve you today.    Your diagnosis today includes: Encounter Diagnoses  Name Primary?  . Reactive airway disease Yes  . Acute bronchitis      Specific recommendations today include:  Begin Zpak antibiotic  Begin 3 days of steroid prednisone  Continue Promethazine cough syrup as needed  Use Zyrtec at bedtime daily for the next several weeks for allergies  Call if not significantly improved by Monday or early next week  Return if symptoms worsen or fail to improve.    I have included other useful information below for your review.  Asthma, Acute Bronchospasm Acute bronchospasm caused by asthma is also referred to as an asthma attack. Bronchospasm means your air passages become narrowed. The narrowing is caused by inflammation and tightening of the muscles in the air tubes (bronchi) in your lungs. This can make it hard to breath or cause you to wheeze and cough. CAUSES Possible triggers are:  Animal dander from the skin, hair, or feathers of animals.  Dust mites contained in house dust.  Cockroaches.  Pollen from trees or grass.  Mold.  Cigarette or tobacco smoke.  Air pollutants such as dust, household cleaners, hair sprays, aerosol sprays, paint fumes, strong chemicals, or strong odors.  Cold air or weather changes. Cold air may trigger inflammation. Winds increase molds and pollens in the air.  Strong emotions such as crying or laughing hard.  Stress.  Certain medicines such as aspirin or beta-blockers.  Sulfites in foods and drinks, such as dried fruits and wine.  Infections or inflammatory conditions, such as a flu, cold, or inflammation of the nasal membranes (rhinitis).  Gastroesophageal reflux disease (GERD). GERD is a condition where stomach acid backs up into your throat (esophagus).  Exercise or strenuous activity. SIGNS AND SYMPTOMS   Wheezing.  Excessive coughing, particularly at night.  Chest  tightness.  Shortness of breath. DIAGNOSIS  Your health care provider will ask you about your medical history and perform a physical exam. A chest X-ray or blood testing may be performed to look for other causes of your symptoms or other conditions that may have triggered your asthma attack. TREATMENT  Treatment is aimed at reducing inflammation and opening up the airways in your lungs. Most asthma attacks are treated with inhaled medicines. These include quick relief or rescue medicines (such as bronchodilators) and controller medicines (such as inhaled corticosteroids). These medicines are sometimes given through an inhaler or a nebulizer. Systemic steroid medicine taken by mouth or given through an IV tube also can be used to reduce the inflammation when an attack is moderate or severe. Antibiotic medicines are only used if a bacterial infection is present.  HOME CARE INSTRUCTIONS   Rest.  Drink plenty of liquids. This helps the mucus to remain thin and be easily coughed up. Only use caffeine in moderation and do not use alcohol until you have recovered from your illness.  Do not smoke. Avoid being exposed to secondhand smoke.  You play a critical role in keeping yourself in good health. Avoid exposure to things that cause you to wheeze or to have breathing problems.  Keep your medicines up to date and available. Carefully follow your health care provider's treatment plan.  Take your medicine exactly as prescribed.  When pollen or pollution is bad, keep windows closed and use an air conditioner or go to places with air conditioning.  Asthma requires careful medical care. See your health care provider for  a follow-up as advised. If you are more than [redacted] weeks pregnant and you were prescribed any new medicines, let your obstetrician know about the visit and how you are doing. Follow-up with your health care provider as directed.  After you have recovered from your asthma attack, make an  appointment with your outpatient doctor to talk about ways to reduce the likelihood of future attacks. If you do not have a doctor who manages your asthma, make an appointment with a primary care doctor to discuss your asthma. SEEK IMMEDIATE MEDICAL CARE IF:   You are getting worse.  You have trouble breathing. If severe, call your local emergency services (911 in the [place]  You develop chest pain or discomfort.  You are vomiting.  You are not able to keep fluids down.  You are coughing up yellow, green, brown, or bloody sputum.  You have a fever and your symptoms suddenly get worse.  You have trouble swallowing. MAKE SURE YOU:   Understand these instructions.  Will watch your condition.  Will get help right away if you are not doing well or get worse. Document Released: 07/16/2006 Document Revised: 12/01/2012 Document Reviewed: 10/06/2012 [facility] Patient Information 2014 [place], [place].   Bronchitis Bronchitis is inflammation of the airways that extend from the windpipe into the lungs (bronchi). The inflammation often causes mucus to develop, which leads to a cough. If the inflammation becomes severe, it may cause shortness of breath. CAUSES  Bronchitis may be caused by:   Viral infections.   Bacteria.   Cigarette smoke.   Allergens, pollutants, and other irritants.  SIGNS AND SYMPTOMS  The most common symptom of bronchitis is a frequent cough that produces mucus. Other symptoms include:  Fever.   Body aches.   Chest congestion.   Chills.   Shortness of breath.   Sore throat.  DIAGNOSIS  Bronchitis is usually diagnosed through a medical history and physical exam. Tests, such as chest X-rays, are sometimes done to rule out other conditions.  TREATMENT  You may need to avoid contact with whatever caused the problem (smoking, for example). Medicines are sometimes needed. These may include:  Antibiotics. These may be prescribed if the condition is  caused by bacteria.  Cough suppressants. These may be prescribed for relief of cough symptoms.   Inhaled medicines. These may be prescribed to help open your airways and make it easier for you to breathe.   Steroid medicines. These may be prescribed for those with recurrent (chronic) bronchitis. HOME CARE INSTRUCTIONS  Get plenty of rest.   Drink enough fluids to keep your urine clear or pale yellow (unless you have a medical condition that requires fluid restriction). Increasing fluids may help thin your secretions and will prevent dehydration.   Only take over-the-counter or prescription medicines as directed by your health care provider.  Only take antibiotics as directed. Make sure you finish them even if you start to feel better.  Avoid secondhand smoke, irritating chemicals, and strong fumes. These will make bronchitis worse. If you are a smoker, quit smoking. Consider using nicotine gum or skin patches to help control withdrawal symptoms. Quitting smoking will help your lungs heal faster.   Put a cool-mist humidifier in your bedroom at night to moisten the air. This may help loosen mucus. Change the water in the humidifier daily. You can also run the hot water in your shower and sit in the bathroom with the door closed for 5 10 minutes.   Follow up with your  health care provider as directed.   Wash your hands frequently to avoid catching bronchitis again or spreading an infection to others.  SEEK MEDICAL CARE IF: Your symptoms do not improve after 1 week of treatment.  SEEK IMMEDIATE MEDICAL CARE IF:  Your fever increases.  You have chills.   You have chest pain.   You have worsening shortness of breath.   You have bloody sputum.  You faint.  You have lightheadedness.  You have a severe headache.   You vomit repeatedly. MAKE SURE YOU:   Understand these instructions.  Will watch your condition.  Will get help right away if you are not doing well  or get worse. Document Released: 03/31/2005 Document Revised: 01/19/2013 Document Reviewed: 11/23/2012 [facility] Patient Information 2014 [place].

## 2013-09-07 ENCOUNTER — Encounter: Payer: Self-pay | Admitting: Gynecology

## 2014-02-13 ENCOUNTER — Encounter: Payer: Self-pay | Admitting: Medical

## 2014-02-17 ENCOUNTER — Encounter: Payer: Self-pay | Admitting: Medical

## 2014-02-17 ENCOUNTER — Ambulatory Visit (INDEPENDENT_AMBULATORY_CARE_PROVIDER_SITE_OTHER): Payer: BC Managed Care – PPO | Admitting: Medical

## 2014-02-17 VITALS — BP 110/70 | HR 77 | Temp 98.1°F | Resp 16 | Wt 188.0 lb

## 2014-02-17 DIAGNOSIS — Z23 Encounter for immunization: Secondary | ICD-10-CM

## 2014-02-17 DIAGNOSIS — J0110 Acute frontal sinusitis, unspecified: Secondary | ICD-10-CM

## 2014-02-17 DIAGNOSIS — H6121 Impacted cerumen, right ear: Secondary | ICD-10-CM

## 2014-02-17 MED ORDER — AZITHROMYCIN 250 MG PO TABS: ORAL_TABLET | ORAL | Status: DC

## 2014-02-17 NOTE — Patient Instructions (Signed)
  Thank you for giving me the opportunity to serve you today.    Your diagnosis today includes: Encounter Diagnoses  Name Primary?  . Acute frontal sinusitis, recurrence not specified Yes  . Impacted cerumen of right ear   . Flu vaccine need      Specific recommendations today include:  Begin Zpak antibiotic  You can use Mucinex DM OTC for symptoms  Neti pot or salt water flush for nose  Salt water gargles for throat pain and mucou  Increase water intake  If not much improved in 4-5 days, let us know  Return if symptoms worsen or fail to improve.    I have included other useful information below for your review.   Using Saline Nose Drops with Bulb Syringe A bulb syringe is used to clear your nose. You may use it when you have a stuffy nose, nasal congestion, sinus pressure, or sneezing.   SALINE SOLUTION You can buy nose drops at your local drug store. You can also make nose drops yourself. Mix 1 cup of water with  teaspoon of salt. Stir. Store this mixture at room temperature. Make a new batch daily.  USE THE BULB IN COMBINATION WITH SALINE NOSE DROPS  Squeeze the air out of the bulb before suctioning the saline mixture.  While still squeezing the bulb flat, place the tip of the bulb into the saline mixture.  Let air come back into the bulb.  This will suction up the saline mixture.  Gently flush one nostril at a time.  Salt water nose drops will then moisten your  congested nose and loosen secretions before suctioning.  Use the bulb syringe as directed below to suction.  USING THE BULB SYRINGE TO SUCTION  While still squeezing the bulb flat, place the tip of the bulb into a nostril. Let air come back into the bulb. The suction will pull snot out of the nose and into the bulb.  Repeat on the other nostril.  Squeeze syringe several times into a tissue.  CLEANING THE BULB SYRINGE  Clean the bulb syringe every day with hot soapy water.  Clean the inside  of the bulb by squeezing the bulb while the tip is in soapy water.  Rinse by squeezing the bulb while the tip is in clean hot water.  Store the bulb with the tip side down on paper towel.  HOME CARE INSTRUCTIONS   Use saline nose drops often to keep the nose open and not stuffy.  Throw away used salt water. Make a new solution every time.  Do not use the same solution and dropper for another person  If you do not prefer to use nasal saline flush, other options include nasal saline spray or the Lillia Carmel, both of which are available over the counter at your pharmacy.

## 2014-02-17 NOTE — Progress Notes (Signed)
Subjective:  Sandra Roy is a 58 y.o. female who presents for possible sinus infection.  Symptoms include one-month history of sinus drainage and sinus pressure. But in the last few days she notes headache above the eyes that is worse, nausea, pain, continual post nasal drainage, having to clear her throat but no cough.  Pressure in her head worse leaning forward.  Denies fever, chills, runny nose, nasal congestion, no ear pain.  Past history is significant for sinus infection and bronchitis in the past. Patient is a non-smoker.  Using Claritin, zyrtec D for symptoms.  Denies sick contacts.  No other aggravating or relieving factors.  No other c/o.  ROS as in subjective   Objective: Filed Vitals:   02/17/14 0929  BP: 110/70  Pulse: 77  Temp: 98.1 F (36.7 C)  Resp: 16    General appearance: Alert, WD/WN, no distress                             Skin: warm, no rash                           Head: + frontal sinus tenderness,                            Eyes: conjunctiva normal, corneas clear, PERRLA                            Ears: right canal with impacted cerumen, left TM appears retracted somewhat, otherwise left ear canal normal                          Nose: septum midline, turbinates swollen, with erythema and no discharge             Mouth/throat: MMM, tongue normal, mild pharyngeal erythema, +post nasal drainage                           Neck: supple, no adenopathy, no thyromegaly, nontender                          Heart: RRR, normal S1, S2, no murmurs                         Lungs: CTA bilaterally, no wheezes, rales, or rhonchi      Assessment and Plan: Encounter Diagnoses  Name Primary?  . Acute frontal sinusitis, recurrence not specified Yes  . Impacted cerumen of right ear   . Flu vaccine need    Discussed sinusitis diagnosis and treatment.  Prescription given for Zpak as it has worked well in the past.  Can use Tylenol or Ibuprofen OTC for fever and malaise.   Discussed symptomatic relief, nasal saline flush, and call or return if worse or not improving in 2-3 days.    Discussed findings.  Discussed risk/benefits of procedure and patient agrees to procedure. Successfully used warm water lavage to remove impacted cerumen from right ear canal. Patient tolerated procedure well. Advised they avoid using any cotton swabs or other devices to clean the ear canals.  Use basic hygiene as discussed.  Follow up prn.   Counseled on the influenza virus vaccine.  Vaccine information  sheet given.  Influenza vaccine given after consent obtained.

## 2014-02-20 ENCOUNTER — Telehealth: Payer: Self-pay | Admitting: Family Medicine

## 2014-02-20 ENCOUNTER — Other Ambulatory Visit: Payer: Self-pay | Admitting: Medical

## 2014-02-20 MED ORDER — AMOXICILLIN 875 MG PO TABS: 875.0000 mg | ORAL_TABLET | Freq: Two times a day (BID) | ORAL | Status: DC

## 2014-02-20 NOTE — Telephone Encounter (Signed)
Patient states that she really was not sure after all about the Zpak. She only knows that she was given the zpak before. She said she has taken amoxicillin and Doxycycline before without any problems.

## 2014-02-20 NOTE — Telephone Encounter (Signed)
Patient is aware that the Rx was sent to the pharmacy

## 2014-02-20 NOTE — Telephone Encounter (Signed)
Pt called and said she cannot take the rx you gave her it is making her sick.  Please call her in a different rx to [facility] in [place].  Pt ph [phone]

## 2014-02-20 NOTE — Telephone Encounter (Signed)
Are we talking about Zpak which she told me she has used in the past without problems?  If so, has she tolerated Amoxicillin?  Doxycycline?

## 2014-02-20 NOTE — Telephone Encounter (Signed)
Amoxicillin sent

## 2014-04-13 ENCOUNTER — Other Ambulatory Visit: Payer: Self-pay | Admitting: Gynecology

## 2014-04-13 NOTE — Telephone Encounter (Signed)
Called into pharmacy

## 2014-05-13 ENCOUNTER — Other Ambulatory Visit: Payer: Self-pay | Admitting: Gynecology

## 2014-05-15 ENCOUNTER — Other Ambulatory Visit: Payer: Self-pay | Admitting: Gynecology

## 2014-05-15 MED ORDER — NONFORMULARY OR COMPOUNDED ITEM: Status: DC

## 2014-05-16 ENCOUNTER — Other Ambulatory Visit: Payer: Self-pay

## 2014-06-26 ENCOUNTER — Encounter: Payer: Self-pay | Admitting: Gynecology

## 2014-06-26 ENCOUNTER — Ambulatory Visit (INDEPENDENT_AMBULATORY_CARE_PROVIDER_SITE_OTHER): Payer: BLUE CROSS/BLUE SHIELD | Admitting: Gynecology

## 2014-06-26 VITALS — BP 140/90 | Ht 63.75 in | Wt 190.0 lb

## 2014-06-26 DIAGNOSIS — F419 Anxiety disorder, unspecified: Secondary | ICD-10-CM

## 2014-06-26 DIAGNOSIS — Z860100 Personal history of colon polyps, unspecified: Secondary | ICD-10-CM

## 2014-06-26 DIAGNOSIS — I10 Essential (primary) hypertension: Secondary | ICD-10-CM | POA: Diagnosis not present

## 2014-06-26 DIAGNOSIS — Z1159 Encounter for screening for other viral diseases: Secondary | ICD-10-CM | POA: Diagnosis not present

## 2014-06-26 DIAGNOSIS — Z01419 Encounter for gynecological examination (general) (routine) without abnormal findings: Secondary | ICD-10-CM | POA: Diagnosis not present

## 2014-06-26 DIAGNOSIS — M8580 Other specified disorders of bone density and structure, unspecified site: Secondary | ICD-10-CM

## 2014-06-26 DIAGNOSIS — Z780 Asymptomatic menopausal state: Secondary | ICD-10-CM

## 2014-06-26 NOTE — Progress Notes (Signed)
Sandra Roy 07-18-1955 [id number]   History:    59 y.o.  for annual gyn exam who has been under a lot of stress and anxiety taking care of an ill mother and sick husband. She has been taking half of a 0.5 mg Xanax tablet only on a when necessary basis. Patient has seen a therapist several years ago. She is currently being followed by the endocrinologist Dr. Bubba Camp for her autoimmune thyroiditis (Hashimoto's).Patient is currently on split regimen of Armour Thyroid consisting of 3 of the 30 milligram tablets which she takes Monday through Thursday and takes 2 of the 30 mg tablets Friday 2 Sunday. Patient recently had a colonoscopy in March of this year and several benign polyps were removed and she's on a 3 year recall cycle she has been followed by Dr. Benson [place] 9 gastric urologist). Patient is currently transferring her PCP to a new one that she'll see in the next 2 weeks. Patient's last bone density study was in 2015 the lowest T score was at the AP spine -1.6 and had statistically significant decreased bone mineralization at the AP spine of -4.8%. She is currently taking her calcium and vitamin D daily.Patient with known history of Leiden V Factor Mutation. Patient has used low-dose vaginal estrogen twice a week when necessary for severe vaginal atrophy. We had discussed the risk benefits and pros and cons.  Past medical history,surgical history, family history and social history were all reviewed and documented in the EPIC chart.  Gynecologic History No LMP recorded. Patient has had a hysterectomy. Contraception: status post hysterectomy Last Pap: 2011. Results were: normal Last mammogram: 2015. Results were: normal  Obstetric History OB History  Gravida Para Term Preterm AB SAB TAB Ectopic Multiple Living  4 3 3  1 1    3     # Outcome Date GA Lbr Len/2nd Weight Sex Delivery Anes PTL Lv  4 SAB           3 Term     F Vag-Spont  N Y  2 Term     M Vag-Spont  N Y  1 Term     F Vag-Spont   N Y       ROS: A ROS was performed and pertinent positives and negatives are included in the history.  GENERAL: No fevers or chills. HEENT: No change in vision, no earache, sore throat or sinus congestion. NECK: No pain or stiffness. CARDIOVASCULAR: No chest pain or pressure. No palpitations. PULMONARY: No shortness of breath, cough or wheeze. GASTROINTESTINAL: No abdominal pain, nausea, vomiting or diarrhea, melena or bright red blood per rectum. GENITOURINARY: No urinary frequency, urgency, hesitancy or dysuria. MUSCULOSKELETAL: No joint or muscle pain, no back pain, no recent trauma. DERMATOLOGIC: No rash, no itching, no lesions. ENDOCRINE: No polyuria, polydipsia, no heat or cold intolerance. No recent change in weight. HEMATOLOGICAL: No anemia or easy bruising or bleeding. NEUROLOGIC: No headache, seizures, numbness, tingling or weakness. PSYCHIATRIC: No depression, no loss of interest in normal activity or change in sleep pattern.     Exam: chaperone present  BP 140/90 mmHg  Ht 5' 3.75" (1.619 m)  Wt 190 lb (86.183 kg)  BMI 32.88 kg/m2  Body mass index is 32.88 kg/(m^2).  General appearance : Well developed well nourished female. No acute distress HEENT: Eyes: no retinal hemorrhage or exudates,  Neck supple, trachea midline, no carotid bruits, no thyroidmegaly Lungs: Clear to auscultation, no rhonchi or wheezes, or rib retractions  Heart: Regular rate  and rhythm, no murmurs or gallops Breast:Examined in sitting and supine position were symmetrical in appearance, no palpable masses or tenderness,  no skin retraction, no nipple inversion, no nipple discharge, no skin discoloration, no axillary or supraclavicular lymphadenopathy Abdomen: no palpable masses or tenderness, no rebound or guarding Extremities: no edema or skin discoloration or tenderness  Pelvic:  Bartholin, Urethra, Skene Glands: Within normal limits             Vagina: No gross lesions or discharge  Cervix:  Absent  Uterus  absent  Adnexa  Without masses or tenderness  Anus and perineum  normal   Rectovaginal  normal sphincter tone without palpated masses or tenderness             Hemoccult colonoscopy this month benign polyps removed     Assessment/Plan:  59 y.o. female for annual exam who suffer from anxiety and directly may be affecting her blood pressure. Her repeat blood pressure was 150/84. She will maintain a log of her blood pressure readings and bring it with her when she goes see her PCP in the next couple weeks. She is also going to be referred to the therapist because her ongoing anxiety for assistance. Dr. Bubba Camp has been doing her thyroid function test. Patient will return back this week in a fasting state for the following screening blood tests: Fasting lipid profile, conference metabolic panel, CBC, and urinalysis. Pap smear no longer needed according to the new guidelines. Patient's vaccines are all up-to-date. Patient was reminded to schedule her mammogram in May of this year. We discussed again the importance of calcium vitamin D and regular exercise for osteoporosis prevention.  Terrance Mass MD, 10:44 AM 06/26/2014

## 2014-06-26 NOTE — Patient Instructions (Addendum)
Hypertension Hypertension, commonly called high blood pressure, is when the force of blood pumping through your arteries is too strong. Your arteries are the blood vessels that carry blood from your heart throughout your body. A blood pressure reading consists of a higher number over a lower number, such as 110/72. The higher number (systolic) is the pressure inside your arteries when your heart pumps. The lower number (diastolic) is the pressure inside your arteries when your heart relaxes. Ideally you want your blood pressure below 120/80. Hypertension forces your heart to work harder to pump blood. Your arteries may become narrow or stiff. Having hypertension puts you at risk for heart disease, stroke, and other problems.  RISK FACTORS Some risk factors for high blood pressure are controllable. Others are not.  Risk factors you cannot control include:   Race. You may be at higher risk if you are African American.  Age. Risk increases with age.  Gender. Men are at higher risk than women before age 45 years. After age 65, women are at higher risk than men. Risk factors you can control include:  Not getting enough exercise or physical activity.  Being overweight.  Getting too much fat, sugar, calories, or salt in your diet.  Drinking too much alcohol. SIGNS AND SYMPTOMS Hypertension does not usually cause signs or symptoms. Extremely high blood pressure (hypertensive crisis) may cause headache, anxiety, shortness of breath, and nosebleed. DIAGNOSIS  To check if you have hypertension, your health care provider will measure your blood pressure while you are seated, with your arm held at the level of your heart. It should be measured at least twice using the same arm. Certain conditions can cause a difference in blood pressure between your right and left arms. A blood pressure reading that is higher than normal on one occasion does not mean that you need treatment. If one blood pressure reading  is high, ask your health care provider about having it checked again. TREATMENT  Treating high blood pressure includes making lifestyle changes and possibly taking medicine. Living a healthy lifestyle can help lower high blood pressure. You may need to change some of your habits. Lifestyle changes may include:  Following the DASH diet. This diet is high in fruits, vegetables, and whole grains. It is low in salt, red meat, and added sugars.  Getting at least 2 hours of brisk physical activity every week.  Losing weight if necessary.  Not smoking.  Limiting alcoholic beverages.  Learning ways to reduce stress. If lifestyle changes are not enough to get your blood pressure under control, your health care provider may prescribe medicine. You may need to take more than one. Work closely with your health care provider to understand the risks and benefits. HOME CARE INSTRUCTIONS  Have your blood pressure rechecked as directed by your health care provider.   Take medicines only as directed by your health care provider. Follow the directions carefully. Blood pressure medicines must be taken as prescribed. The medicine does not work as well when you skip doses. Skipping doses also puts you at risk for problems.   Do not smoke.   Monitor your blood pressure at home as directed by your health care provider. SEEK MEDICAL CARE IF:   You think you are having a reaction to medicines taken.  You have recurrent headaches or feel dizzy.  You have swelling in your ankles.  You have trouble with your vision. SEEK IMMEDIATE MEDICAL CARE IF:  You develop a severe headache or confusion.    You have unusual weakness, numbness, or feel faint.  You have severe chest or abdominal pain.  You vomit repeatedly.  You have trouble breathing. MAKE SURE YOU:   Understand these instructions.  Will watch your condition.  Will get help right away if you are not doing well or get worse. Document  Released: 03/31/2005 Document Revised: 08/15/2013 Document Reviewed: 01/21/2013 [facility] Patient Information 2015 [place], [place]. This information is not intended to replace advice given to you by your health care provider. Make sure you discuss any questions you have with your health care provider. Generalized Anxiety Disorder Generalized anxiety disorder (GAD) is a mental disorder. It interferes with life functions, including relationships, work, and school. GAD is different from normal anxiety, which everyone experiences at some point in their lives in response to specific life events and activities. Normal anxiety actually helps [place] prepare for and get through these life events and activities. Normal anxiety goes away after the event or activity is over.  GAD causes anxiety that is not necessarily related to specific events or activities. It also causes excess anxiety in proportion to specific events or activities. The anxiety associated with GAD is also difficult to control. GAD can vary from mild to severe. People with severe GAD can have intense waves of anxiety with physical symptoms (panic attacks).  SYMPTOMS The anxiety and worry associated with GAD are difficult to control. This anxiety and worry are related to many life events and activities and also occur more days than not for 6 months or longer. People with GAD also have three or more of the following symptoms (one or more in children): Restlessness.  Fatigue. Difficulty concentrating.  Irritability. Muscle tension. Difficulty sleeping or unsatisfying sleep. DIAGNOSIS GAD is diagnosed through an assessment by your health care provider. Your health care provider will ask you questions aboutyour mood,physical symptoms, and events in your life. Your health care provider may ask you about your medical history and use of alcohol or drugs, including prescription medicines. Your health care provider may also do a physical exam and blood  tests. Certain medical conditions and the use of certain substances can cause symptoms similar to those associated with GAD. Your health care provider may refer you to a mental health specialist for further evaluation. TREATMENT The following therapies are usually used to treat GAD:  Medication. Antidepressant medication usually is prescribed for long-term daily control. Antianxiety medicines may be added in severe cases, especially when panic attacks occur.  Talk therapy (psychotherapy). Certain types of talk therapy can be helpful in treating GAD by providing support, education, and guidance. A form of talk therapy called cognitive behavioral therapy can teach you healthy ways to think about and react to daily life events and activities. Stress managementtechniques. These include yoga, meditation, and exercise and can be very helpful when they are practiced regularly. A mental health specialist can help determine which treatment is best for you. Some people see improvement with one therapy. However, other people require a combination of therapies. Document Released: 07/26/2012 Document Revised: 08/15/2013 Document Reviewed: 07/26/2012 [facility] Patient Information 2015 [place], [place]. This information is not intended to replace advice given to you by your health care provider. Make sure you discuss any questions you have with your health care provider.

## 2014-06-27 ENCOUNTER — Other Ambulatory Visit: Payer: BLUE CROSS/BLUE SHIELD

## 2014-06-27 LAB — CBC WITH DIFFERENTIAL/PLATELET
Basophils Absolute: 0 10*3/uL (ref 0.0–0.1)
Basophils Relative: 0 % (ref 0–1)
Eosinophils Absolute: 0.2 10*3/uL (ref 0.0–0.7)
Eosinophils Relative: 3 % (ref 0–5)
HCT: 37.9 % (ref 36.0–46.0)
Hemoglobin: 13.2 g/dL (ref 12.0–15.0)
Lymphocytes Relative: 42 % (ref 12–46)
Lymphs Abs: 2.7 10*3/uL (ref 0.7–4.0)
MCH: 31.3 pg (ref 26.0–34.0)
MCHC: 34.8 g/dL (ref 30.0–36.0)
MCV: 89.8 fL (ref 78.0–100.0)
MPV: 10.2 fL (ref 8.6–12.4)
Monocytes Absolute: 0.4 10*3/uL (ref 0.1–1.0)
Monocytes Relative: 7 % (ref 3–12)
Neutro Abs: 3.1 10*3/uL (ref 1.7–7.7)
Neutrophils Relative %: 48 % (ref 43–77)
Platelets: 231 10*3/uL (ref 150–400)
RBC: 4.22 MIL/uL (ref 3.87–5.11)
RDW: 13.7 % (ref 11.5–15.5)
WBC: 6.4 10*3/uL (ref 4.0–10.5)

## 2014-06-27 LAB — COMPREHENSIVE METABOLIC PANEL
ALT: 15 U/L (ref 0–35)
AST: 19 U/L (ref 0–37)
Albumin: 4.1 g/dL (ref 3.5–5.2)
Alkaline Phosphatase: 85 U/L (ref 39–117)
BUN: 10 mg/dL (ref 6–23)
CO2: 25 mEq/L (ref 19–32)
Calcium: 9.4 mg/dL (ref 8.4–10.5)
Chloride: 109 mEq/L (ref 96–112)
Creat: 0.7 mg/dL (ref 0.50–1.10)
Glucose, Bld: 89 mg/dL (ref 70–99)
Potassium: 4.9 mEq/L (ref 3.5–5.3)
Sodium: 143 mEq/L (ref 135–145)
Total Bilirubin: 0.5 mg/dL (ref 0.2–1.2)
Total Protein: 6.7 g/dL (ref 6.0–8.3)

## 2014-06-27 LAB — LIPID PANEL
Cholesterol: 146 mg/dL (ref 0–200)
HDL: 56 mg/dL (ref >=46)
LDL Cholesterol: 78 mg/dL (ref 0–99)
Total CHOL/HDL Ratio: 2.6 Ratio
Triglycerides: 60 mg/dL (ref <150)
VLDL: 12 mg/dL (ref 0–40)

## 2014-06-27 LAB — HEPATITIS C ANTIBODY: HCV Ab: NEGATIVE

## 2014-06-27 NOTE — Addendum Note (Signed)
Addended by: Federico Flake on: 06/27/2014 09:07 AM   Modules accepted: Orders

## 2014-06-27 NOTE — Addendum Note (Signed)
Addended by: Federico Flake on: 06/27/2014 09:13 AM   Modules accepted: Orders

## 2014-06-28 LAB — URINALYSIS W MICROSCOPIC + REFLEX CULTURE
Bacteria, UA: NONE SEEN
Bilirubin Urine: NEGATIVE
Casts: NONE SEEN
Crystals: NONE SEEN
Glucose, UA: NEGATIVE mg/dL
Hgb urine dipstick: NEGATIVE
Ketones, ur: NEGATIVE mg/dL
Nitrite: NEGATIVE
Protein, ur: NEGATIVE mg/dL
Specific Gravity, Urine: 1.005 (ref 1.005–1.030)
Squamous Epithelial / LPF: NONE SEEN
Urobilinogen, UA: 0.2 mg/dL (ref 0.0–1.0)
pH: 7 (ref 5.0–8.0)

## 2014-06-29 LAB — URINE CULTURE: Colony Count: 9000

## 2014-07-07 ENCOUNTER — Ambulatory Visit (INDEPENDENT_AMBULATORY_CARE_PROVIDER_SITE_OTHER): Payer: BLUE CROSS/BLUE SHIELD | Admitting: Licensed Clinical Social Worker

## 2014-07-07 DIAGNOSIS — F419 Anxiety disorder, unspecified: Secondary | ICD-10-CM

## 2014-07-12 ENCOUNTER — Ambulatory Visit: Payer: Self-pay | Admitting: Licensed Clinical Social Worker

## 2014-07-26 ENCOUNTER — Ambulatory Visit: Payer: BLUE CROSS/BLUE SHIELD | Admitting: Licensed Clinical Social Worker

## 2014-10-11 ENCOUNTER — Encounter: Payer: Self-pay | Admitting: Gynecology

## 2014-11-20 ENCOUNTER — Telehealth: Payer: Self-pay | Admitting: *Deleted

## 2014-11-20 NOTE — Telephone Encounter (Signed)
Pt called requesting referral for Dr.Hung at [facility] has hashimoto disease and hyperglycemia. I explained to pt that Dr.Balan could do this since she has been following her because notes and labs will needed for referral. Pt will call [place] office

## 2014-12-13 ENCOUNTER — Other Ambulatory Visit: Payer: Self-pay | Admitting: Gynecology

## 2014-12-13 NOTE — Telephone Encounter (Signed)
Called into pharmacy

## 2015-05-29 ENCOUNTER — Other Ambulatory Visit: Payer: Self-pay | Admitting: Gynecology

## 2015-06-27 ENCOUNTER — Encounter: Payer: BLUE CROSS/BLUE SHIELD | Admitting: Gynecology

## 2015-08-07 ENCOUNTER — Ambulatory Visit (INDEPENDENT_AMBULATORY_CARE_PROVIDER_SITE_OTHER): Payer: PRIVATE HEALTH INSURANCE | Admitting: Gynecology

## 2015-08-07 ENCOUNTER — Encounter: Payer: Self-pay | Admitting: Gynecology

## 2015-08-07 VITALS — BP 128/86 | Ht 63.25 in | Wt 197.0 lb

## 2015-08-07 DIAGNOSIS — Z01419 Encounter for gynecological examination (general) (routine) without abnormal findings: Secondary | ICD-10-CM | POA: Diagnosis not present

## 2015-08-07 DIAGNOSIS — M8580 Other specified disorders of bone density and structure, unspecified site: Secondary | ICD-10-CM

## 2015-08-07 DIAGNOSIS — F419 Anxiety disorder, unspecified: Secondary | ICD-10-CM | POA: Diagnosis not present

## 2015-08-07 MED ORDER — ESTRADIOL 10 MCG VA TABS: 10.0000 ug | ORAL_TABLET | VAGINAL | Status: DC

## 2015-08-07 MED ORDER — ALPRAZOLAM 0.5 MG PO TABS: 0.5000 mg | ORAL_TABLET | Freq: Every evening | ORAL | Status: DC | PRN

## 2015-08-07 NOTE — Patient Instructions (Signed)
Estradiol vaginal tablets What is this medicine? ESTRADIOL (es tra DYE ole) vaginal tablet is used to help relieve symptoms of vaginal irritation and dryness that occurs in some women during menopause. This medicine may be used for other purposes; ask your health care provider or pharmacist if you have questions. What should I tell my health care provider before I take this medicine? They need to know if you have any of these conditions: -abnormal vaginal bleeding -blood vessel disease or blood clots -breast, cervical, endometrial, ovarian, liver, or uterine cancer -dementia -diabetes -gallbladder disease -heart disease or recent heart attack -high blood pressure -high cholesterol -high level of calcium in the blood -hysterectomy -kidney disease -liver disease -migraine headaches -protein C deficiency -protein S deficiency -stroke -systemic lupus erythematosus (SLE) -tobacco smoker -an unusual or allergic reaction to estrogens, other hormones, medicines, foods, dyes, or preservatives -pregnant or trying to get pregnant -breast-feeding How should I use this medicine? This medicine is only for use in the vagina. Do not take by mouth. Wash and dry your hands before and after use. Read package directions carefully. Unwrap the applicator package. Be sure to use a new applicator for each dose. Use at the same time each day. If the tablet has fallen out of the applicator, but is still in the package, carefully place it back into the applicator. If the tablet has fallen out of the package, that applicator should be thrown out and you should use a new applicator containing a new tablet. Lie on your back, part and bend your knees. Gently insert the applicator as far as comfortably possible into the vagina. Then, gently press the plunger until the plunger is fully depressed. This will release the tablet into the vagina. Gently remove the applicator. Throw away the applicator after use. Do not use  your medicine more often than directed. Do not stop using except on the advice of your doctor or health care professional. Talk to your pediatrician regarding the use of this medicine in children. This medicine is not approved for use in children. A patient package insert for the product will be given with each prescription and refill. Read this sheet carefully each time. The sheet may change frequently. Overdosage: If you think you have taken too much of this medicine contact a poison control center or emergency room at once. NOTE: This medicine is only for you. Do not share this medicine with others. What if I miss a dose? If you miss a dose, take it as soon as you can. If it is almost time for your next dose, take only that dose. Do not take double or extra doses. What may interact with this medicine? Do not take this medicine with any of the following medications: -aromatase inhibitors like aminoglutethimide, anastrozole, exemestane, letrozole, testolactone This medicine may also interact with the following medications: -antibiotics used to treat tuberculosis like rifabutin, rifampin and rifapentene -raloxifene or tamoxifen -warfarin This list may not describe all possible interactions. Give your health care provider a list of all the medicines, herbs, non-prescription drugs, or dietary supplements you use. Also tell them if you smoke, drink alcohol, or use illegal drugs. Some items may interact with your medicine. What should I watch for while using this medicine? Visit your health care professional for regular checks on your progress. You will need a regular breast and pelvic exam. You should also discuss the need for regular mammograms with your health care professional, and follow his or her guidelines. This medicine can make   your body retain fluid, making your fingers, hands, or ankles swell. Your blood pressure can go up. Contact your doctor or health care professional if you feel you are  retaining fluid. If you have any reason to think you are pregnant; stop taking this medicine at once and contact your doctor or health care professional. Tobacco smoking increases the risk of getting a blood clot or having a stroke, especially if you are more than 62 years old. You are strongly advised not to smoke. If you wear contact lenses and notice visual changes, or if the lenses begin to feel uncomfortable, consult your eye care specialist. If you are going to have elective surgery, you may need to stop taking this medicine beforehand. Consult your health care professional for advice prior to scheduling the surgery. What side effects may I notice from receiving this medicine? Side effects that you should report to your doctor or health care professional as soon as possible: -allergic reactions like skin rash, itching or hives, swelling of the face, lips, or tongue -breast tissue changes or discharge -changes in vision -chest pain -confusion, trouble speaking or understanding -dark urine -general ill feeling or flu-like symptoms -light-colored stools -nausea, vomiting -pain, swelling, warmth in the leg -right upper belly pain -severe headaches -shortness of breath -sudden numbness or weakness of the face, arm or leg -trouble walking, dizziness, loss of balance or coordination -unusual vaginal bleeding -yellowing of the eyes or skin Side effects that usually do not require medical attention (report to your doctor or health care professional if they continue or are bothersome): -hair loss -increased hunger or thirst -increased urination -symptoms of vaginal infection like itching, irritation or unusual discharge -unusually weak or tired This list may not describe all possible side effects. Call your doctor for medical advice about side effects. You may report side effects to FDA at 1-800-FDA-1088. Where should I keep my medicine? Keep out of the reach of children. Store at room  temperature between 15 and 30 degrees C (59 and 86 degrees F). Throw away any unused medicine after the expiration date. NOTE: This sheet is a summary. It may not cover all possible information. If you have questions about this medicine, talk to your doctor, pharmacist, or health care provider.    2016, Elsevier/Gold Standard. (2014-03-15 09:22:51)  

## 2015-08-07 NOTE — Progress Notes (Signed)
Sandra Roy 1956/01/09 [id number]   History:    60 y.o.  for annual gyn exam with only complaint is of some weight gain and anxiety since her grandson is going away and [employer]. Takes and a 0.5 mg when necessary and needs a refill. She has change in the endocrinologist who now has her on levothyroxine 88 g daily as a result of her Hashimoto's thyroiditis. Several benign polyps were noted and a colonoscopy in March 2016 and she is on a 3 year recall.Patient's last bone density study was in 2015 the lowest T score was at the AP spine -1.6 and had statistically significant decreased bone mineralization at the AP spine of -4.8%. She is currently taking her calcium and vitamin D daily.Patient with known history of Leiden V Factor Mutation. Patient has used low-dose vaginal estrogen twice a week when necessary for severe vaginal atrophy. We had discussed the risk benefits and pros and cons. She has been on vaginal estrogen twice a week and would like something less messy. Patient with prior history of TAH/BSO.  Past medical history,surgical history, family history and social history were all reviewed and documented in the EPIC chart.  Gynecologic History No LMP recorded. Patient has had a hysterectomy. Contraception: status post hysterectomy Last Pap: Several years ago. Results were: normal Last mammogram: 2016. Results were: normal  Obstetric History OB History  Gravida Para Term Preterm AB SAB TAB Ectopic Multiple Living  4 3 3  1 1    3     # Outcome Date GA Lbr Len/2nd Weight Sex Delivery Anes PTL Lv  4 SAB           3 Term     F Vag-Spont  N Y  2 Term     M Vag-Spont  N Y  1 Term     F Vag-Spont  N Y       ROS: A ROS was performed and pertinent positives and negatives are included in the history.  GENERAL: No fevers or chills. HEENT: No change in vision, no earache, sore throat or sinus congestion. NECK: No pain or stiffness. CARDIOVASCULAR: No chest pain or pressure. No  palpitations. PULMONARY: No shortness of breath, cough or wheeze. GASTROINTESTINAL: No abdominal pain, nausea, vomiting or diarrhea, melena or bright red blood per rectum. GENITOURINARY: No urinary frequency, urgency, hesitancy or dysuria. MUSCULOSKELETAL: No joint or muscle pain, no back pain, no recent trauma. DERMATOLOGIC: No rash, no itching, no lesions. ENDOCRINE: No polyuria, polydipsia, no heat or cold intolerance. No recent change in weight. HEMATOLOGICAL: No anemia or easy bruising or bleeding. NEUROLOGIC: No headache, seizures, numbness, tingling or weakness. PSYCHIATRIC: No depression, no loss of interest in normal activity or change in sleep pattern.     Exam: chaperone present  BP 128/86 mmHg  Ht 5' 3.25" (1.607 m)  Wt 197 lb (89.359 kg)  BMI 34.60 kg/m2  Body mass index is 34.6 kg/(m^2).  General appearance : Well developed well nourished female. No acute distress HEENT: Eyes: no retinal hemorrhage or exudates,  Neck supple, trachea midline, no carotid bruits, no thyroidmegaly Lungs: Clear to auscultation, no rhonchi or wheezes, or rib retractions  Heart: Regular rate and rhythm, no murmurs or gallops Breast:Examined in sitting and supine position were symmetrical in appearance, no palpable masses or tenderness,  no skin retraction, no nipple inversion, no nipple discharge, no skin discoloration, no axillary or supraclavicular lymphadenopathy Abdomen: no palpable masses or tenderness, no rebound or guarding Extremities: no edema  or skin discoloration or tenderness  Pelvic:  Bartholin, Urethra, Skene Glands: Within normal limits             Vagina: No gross lesions or discharge  Cervix: Absent  Uterus  absent  Adnexa  Without masses or tenderness  Anus and perineum  normal   Rectovaginal  normal sphincter tone without palpated masses or tenderness             Hemoccult colonoscopy less than 12 months benign polyp removed on 3 year recall     Assessment/Plan:  60 y.o.  female for annual exam will be switched from vaginal estrogen cream to Vagifem 10 g twice a week for vaginal atrophy. Prescription refill for Xanax 0.5 mg that she takes when necessary was provided. Her PCP in her endocrinologist of been doing her blood work. Patient to schedule her bone density study she does have history of osteopenia her last bone density study in 2015 demonstrated the T score -1.6 at the AP spine with significant decrease in bone mineralization of -4.8% when compared with previous study of 2013. We discussed importance of calcium vitamin D and weightbearing exercises for osteoporosis prevention.   Terrance Mass MD, 5:08 PM 08/07/2015

## 2015-11-28 ENCOUNTER — Encounter: Payer: Self-pay | Admitting: Gynecology

## 2016-06-17 ENCOUNTER — Other Ambulatory Visit: Payer: Self-pay | Admitting: Gynecology

## 2016-06-17 NOTE — Telephone Encounter (Signed)
Called in to pharmacy. Patient has her April CE scheduled.

## 2016-06-17 NOTE — Telephone Encounter (Signed)
Needs annual in April

## 2016-08-07 ENCOUNTER — Encounter: Payer: PRIVATE HEALTH INSURANCE | Admitting: Gynecology

## 2016-08-14 ENCOUNTER — Encounter: Payer: Self-pay | Admitting: Gynecology

## 2016-08-14 ENCOUNTER — Ambulatory Visit (INDEPENDENT_AMBULATORY_CARE_PROVIDER_SITE_OTHER): Payer: BLUE CROSS/BLUE SHIELD | Admitting: Gynecology

## 2016-08-14 VITALS — BP 120/82 | Ht 64.0 in | Wt 198.0 lb

## 2016-08-14 DIAGNOSIS — Z780 Asymptomatic menopausal state: Secondary | ICD-10-CM

## 2016-08-14 DIAGNOSIS — E038 Other specified hypothyroidism: Secondary | ICD-10-CM

## 2016-08-14 DIAGNOSIS — Z79890 Hormone replacement therapy (postmenopausal): Secondary | ICD-10-CM

## 2016-08-14 DIAGNOSIS — M6289 Other specified disorders of muscle: Secondary | ICD-10-CM

## 2016-08-14 DIAGNOSIS — Z01419 Encounter for gynecological examination (general) (routine) without abnormal findings: Secondary | ICD-10-CM | POA: Diagnosis not present

## 2016-08-14 DIAGNOSIS — M8580 Other specified disorders of bone density and structure, unspecified site: Secondary | ICD-10-CM | POA: Diagnosis not present

## 2016-08-14 MED ORDER — ESTROGENS, CONJUGATED 0.625 MG/GM VA CREA: TOPICAL_CREAM | VAGINAL | 12 refills | Status: DC

## 2016-08-14 MED ORDER — ALPRAZOLAM 0.5 MG PO TABS: 0.5000 mg | ORAL_TABLET | Freq: Every evening | ORAL | 3 refills | Status: DC | PRN

## 2016-08-14 MED ORDER — ALPRAZOLAM 0.5 MG PO TABS: 0.5000 mg | ORAL_TABLET | Freq: Every evening | ORAL | 2 refills | Status: DC | PRN

## 2016-08-14 NOTE — Patient Instructions (Signed)
Bone Densitometry Bone densitometry is an imaging test that uses a special X-ray to measure the amount of calcium and other minerals in your bones (bone density). This test is also known as a bone mineral density test or dual-energy X-ray absorptiometry (DXA). The test can measure bone density at your hip and your spine. It is similar to having a regular X-ray. You may have this test to:  Diagnose a condition that causes weak or thin bones (osteoporosis).  Predict your risk of a broken bone (fracture).  Determine how well osteoporosis treatment is working. Tell a health care provider about:  Any allergies you have.  All medicines you are taking, including vitamins, herbs, eye drops, creams, and over-the-counter medicines.  Any problems you or family members have had with anesthetic medicines.  Any blood disorders you have.  Any surgeries you have had.  Any medical conditions you have.  Possibility of pregnancy.  Any other medical test you had within the previous 14 days that used contrast material. What are the risks? Generally, this is a safe procedure. However, problems can occur and may include the following:  This test exposes you to a very small amount of radiation.  The risks of radiation exposure may be greater to unborn children. What happens before the procedure?  Do not take any calcium supplements for 24 hours before having the test. You can otherwise eat and drink what you usually do.  Take off all metal jewelry, eyeglasses, dental appliances, and any other metal objects. What happens during the procedure?  You may lie on an exam table. There will be an X-ray generator below you and an imaging device above you.  Other devices, such as boxes or braces, may be used to position your body properly for the scan.  You will need to lie still while the machine slowly scans your body.  The images will show up on a computer monitor. What happens after the  procedure? You may need more testing at a later time. This information is not intended to replace advice given to you by your health care provider. Make sure you discuss any questions you have with your health care provider. Document Released: 04/22/2004 Document Revised: 09/06/2015 Document Reviewed: 09/08/2013 Elsevier Interactive Patient Education  2017 Elsevier Inc.  

## 2016-08-14 NOTE — Progress Notes (Signed)
Sandra Roy 11/03/1955 [id number]   History:    61 y.o.  for annual gyn exam complaining of feeling tired and fatigue and lack of energy. She had been followed by her endocrinologist last year for which she's on Synthroid 88 g daily for her Hashimoto's thyroiditis. Patient takes Xanax 0.5 mg on a when necessary basis due to anxiety since her grandson is being deployed [place]. She needed a refill. Patient stated that she had a colonoscopy in 2016 benign polyps were removed.Patient's last bone density study was in 2015 the lowest T score was at the AP spine -1.6 and had statistically significant decreased bone mineralization at the AP spine of -4.8%. She is currently taking her calcium and vitamin D daily.Patient with known history of Leiden V Factor Mutation. Patient has used low-dose vaginal estrogen twice a week when necessary for severe vaginal atrophy. We had discussed the risk benefits and pros and cons. Patient with past history of total abdominal hysterectomy and bilateral salpingo-oophorectomy for benign pathology. Prior to her hysterectomy patient had no history of abnormal Pap smears.  Past medical history,surgical history, family history and social history were all reviewed and documented in the EPIC chart.  Gynecologic History No LMP recorded. Patient has had a hysterectomy. Contraception: status post hysterectomy Last Pap: 2011. Results were: normal Last mammogram: 2017. Result normal    Obstetric History OB History  Gravida Para Term Preterm AB Living  4 3 3   1 3   SAB TAB Ectopic Multiple Live Births  1       3    # Outcome Date GA Lbr Len/2nd Weight Sex Delivery Anes PTL Lv  4 SAB           3 Term     F Vag-Spont  N LIV  2 Term     M Vag-Spont  N LIV  1 Term     F Vag-Spont  N LIV       ROS: A ROS was performed and pertinent positives and negatives are included in the history.  GENERAL: No fevers or chills. HEENT: No change in vision, no earache, sore  throat or sinus congestion. NECK: No pain or stiffness. CARDIOVASCULAR: No chest pain or pressure. No palpitations. PULMONARY: No shortness of breath, cough or wheeze. GASTROINTESTINAL: No abdominal pain, nausea, vomiting or diarrhea, melena or bright red blood per rectum. GENITOURINARY: No urinary frequency, urgency, hesitancy or dysuria. MUSCULOSKELETAL: No joint or muscle pain, no back pain, no recent trauma. DERMATOLOGIC: No rash, no itching, no lesions. ENDOCRINE: No polyuria, polydipsia, no heat or cold intolerance. No recent change in weight. HEMATOLOGICAL: No anemia or easy bruising or bleeding. NEUROLOGIC: No headache, seizures, numbness, tingling or weakness. PSYCHIATRIC: No depression, no loss of interest in normal activity or change in sleep pattern.     Exam: chaperone present  BP 120/82   Ht 5\' 4"  (1.626 m)   Wt 198 lb (89.8 kg)   BMI 33.99 kg/m   Body mass index is 33.99 kg/m.  General appearance : Well developed well nourished female. No acute distress HEENT: Eyes: no retinal hemorrhage or exudates,  Neck supple, trachea midline, no carotid bruits, no thyroidmegaly Lungs: Clear to auscultation, no rhonchi or wheezes, or rib retractions  Heart: Regular rate and rhythm, no murmurs or gallops Breast:Examined in sitting and supine position were symmetrical in appearance, no palpable masses or tenderness,  no skin retraction, no nipple inversion, no nipple discharge, no skin discoloration, no axillary or supraclavicular  lymphadenopathy Abdomen: no palpable masses or tenderness, no rebound or guarding Extremities: no edema or skin discoloration or tenderness  Pelvic:  Bartholin, Urethra, Skene Glands: Within normal limits             Vagina: No gross lesions or discharge, mild atrophic changes   Cervix:  Absent  Uterus  absent Adnexa  Without masses or tenderness  Anus and perineum  normal   Rectovaginal  normal sphincter tone without palpated masses or tenderness              Hemoccult  cards will be provided     Assessment/Plan:  61 y.o. female for annual exam  with history of osteopenia overdue for bone density study. She will schedule that in the next few weeks along with the following fasting blood work: Comprehensive metabolic panel, CBC, fasting lipid profile. Because of her tiredness and fatigue will check a TSH and vitamin D level. Pap smear no longer indicated. Prescription refill for Premarin vaginal cream to apply twice a week for her vaginal atrophy was provided as well as prescription for Xanax 0.5 mg to take 1 by mouth daily on a when necessary basis. She was reminded that she is a colonoscopy in 2019 and her next mammogram was in August 2018. We discussed importance of calcium vitamin D and weightbearing exercises for osteoporosis prevention. She was provided with fecal Hemoccult cards to submit to the office for testing.   Terrance Mass MD, 9:54 AM 08/14/2016

## 2016-08-27 ENCOUNTER — Encounter: Payer: Self-pay | Admitting: Gynecology

## 2016-08-28 ENCOUNTER — Other Ambulatory Visit: Payer: BLUE CROSS/BLUE SHIELD

## 2016-08-28 ENCOUNTER — Other Ambulatory Visit: Payer: Self-pay | Admitting: Gynecology

## 2016-08-28 ENCOUNTER — Ambulatory Visit (INDEPENDENT_AMBULATORY_CARE_PROVIDER_SITE_OTHER): Payer: BLUE CROSS/BLUE SHIELD

## 2016-08-28 DIAGNOSIS — M8588 Other specified disorders of bone density and structure, other site: Secondary | ICD-10-CM

## 2016-08-28 DIAGNOSIS — M8580 Other specified disorders of bone density and structure, unspecified site: Secondary | ICD-10-CM

## 2016-08-28 DIAGNOSIS — Z13820 Encounter for screening for osteoporosis: Secondary | ICD-10-CM

## 2016-08-28 DIAGNOSIS — M8589 Other specified disorders of bone density and structure, multiple sites: Secondary | ICD-10-CM

## 2016-08-28 LAB — COMPREHENSIVE METABOLIC PANEL
ALT: 12 U/L (ref 6–29)
AST: 17 U/L (ref 10–35)
Albumin: 4.3 g/dL (ref 3.6–5.1)
Alkaline Phosphatase: 75 U/L (ref 33–130)
BUN: 10 mg/dL (ref 7–25)
CO2: 29 mmol/L (ref 20–31)
Calcium: 9.1 mg/dL (ref 8.6–10.4)
Chloride: 106 mmol/L (ref 98–110)
Creat: 0.65 mg/dL (ref 0.50–0.99)
Glucose, Bld: 95 mg/dL (ref 65–99)
Potassium: 4.9 mmol/L (ref 3.5–5.3)
Sodium: 142 mmol/L (ref 135–146)
Total Bilirubin: 0.4 mg/dL (ref 0.2–1.2)
Total Protein: 6.9 g/dL (ref 6.1–8.1)

## 2016-08-28 LAB — CBC WITH DIFFERENTIAL/PLATELET
Basophils Absolute: 0 cells/uL (ref 0–200)
Basophils Relative: 0 %
Eosinophils Absolute: 177 cells/uL (ref 15–500)
Eosinophils Relative: 3 %
HCT: 39.8 % (ref 35.0–45.0)
Hemoglobin: 13.2 g/dL (ref 11.7–15.5)
Lymphocytes Relative: 37 %
Lymphs Abs: 2183 cells/uL (ref 850–3900)
MCH: 30.6 pg (ref 27.0–33.0)
MCHC: 33.2 g/dL (ref 32.0–36.0)
MCV: 92.3 fL (ref 80.0–100.0)
MPV: 9.7 fL (ref 7.5–12.5)
Monocytes Absolute: 472 cells/uL (ref 200–950)
Monocytes Relative: 8 %
Neutro Abs: 3068 cells/uL (ref 1500–7800)
Neutrophils Relative %: 52 %
Platelets: 258 10*3/uL (ref 140–400)
RBC: 4.31 MIL/uL (ref 3.80–5.10)
RDW: 13.4 % (ref 11.0–15.0)
WBC: 5.9 10*3/uL (ref 3.8–10.8)

## 2016-08-28 LAB — TSH: TSH: 2.8 mIU/L

## 2016-08-29 LAB — VITAMIN D 25 HYDROXY (VIT D DEFICIENCY, FRACTURES): Vit D, 25-Hydroxy: 40 ng/mL (ref 30–100)

## 2016-09-03 ENCOUNTER — Telehealth: Payer: Self-pay | Admitting: *Deleted

## 2016-09-03 NOTE — Telephone Encounter (Signed)
Pt informed with normal labs results on 08/28/16

## 2016-12-25 ENCOUNTER — Other Ambulatory Visit: Payer: Self-pay

## 2016-12-25 MED ORDER — ALPRAZOLAM 0.5 MG PO TABS: 0.5000 mg | ORAL_TABLET | Freq: Every evening | ORAL | 0 refills | Status: DC | PRN

## 2016-12-25 NOTE — Telephone Encounter (Signed)
Rx called in 

## 2016-12-25 NOTE — Telephone Encounter (Signed)
At 08/14/16 CE with Dr. Moshe Salisbury "Patient takes Xanax 0.5 mg on a when necessary basis due to anxiety since her grandson is being deployed overseas."

## 2017-01-12 ENCOUNTER — Ambulatory Visit (INDEPENDENT_AMBULATORY_CARE_PROVIDER_SITE_OTHER): Payer: BLUE CROSS/BLUE SHIELD | Admitting: Podiatry

## 2017-01-12 ENCOUNTER — Ambulatory Visit (INDEPENDENT_AMBULATORY_CARE_PROVIDER_SITE_OTHER): Payer: BLUE CROSS/BLUE SHIELD

## 2017-01-12 ENCOUNTER — Encounter: Payer: Self-pay | Admitting: Podiatry

## 2017-01-12 VITALS — BP 145/71 | HR 78 | Resp 16

## 2017-01-12 DIAGNOSIS — D1724 Benign lipomatous neoplasm of skin and subcutaneous tissue of left leg: Secondary | ICD-10-CM | POA: Diagnosis not present

## 2017-01-12 DIAGNOSIS — M722 Plantar fascial fibromatosis: Secondary | ICD-10-CM

## 2017-01-12 MED ORDER — MELOXICAM 15 MG PO TABS: 15.0000 mg | ORAL_TABLET | Freq: Every day | ORAL | 3 refills | Status: DC

## 2017-01-12 MED ORDER — METHYLPREDNISOLONE 4 MG PO TBPK: ORAL_TABLET | ORAL | 0 refills | Status: DC

## 2017-01-12 NOTE — Patient Instructions (Signed)

## 2017-01-12 NOTE — Progress Notes (Signed)
Subjective:  Patient ID: Sandra Roy, female    DOB: 1956-02-18,  MRN: [id number] HPI Chief Complaint  Patient presents with  . Foot Pain    Dorsal midfoot to lateral side/ankle left - aching for 15 months, went to [facility]-treated with PT and naproxen, wore boot, no better-wanted 2nd opinion    61 y.o. female presents with the above complaint.     Past Medical History:  Diagnosis Date  . Anxiety   . Autoimmune thyroiditis   . Depression   . Factor V Leiden mutation ([place])   . Hashimoto's disease   . Hypoglycemic syndrome   . NSVD (normal spontaneous vaginal delivery)    x3  . Osteopenia    Past Surgical History:  Procedure Laterality Date  . ABDOMINAL HYSTERECTOMY  2003   TAH/BSO  . DILATION AND CURETTAGE OF UTERUS    . ENDOMETRIAL ABLATION    . KNEE ARTHROSCOPY  2010   RIGHT  . PELVIC LAPAROSCOPY     LSO  . TUBAL LIGATION  1976  . WRIST SURGERY  2003   RIGHT,,, CARPAL TUNNEL    Current Outpatient Prescriptions:  .  ALPRAZolam (XANAX) 0.5 MG tablet, Take 1 tablet (0.5 mg total) by mouth at bedtime as needed for anxiety., Disp: 30 tablet, Rfl: 0 .  aspirin 81 MG tablet, Take 81 mg by mouth daily., Disp: , Rfl:  .  cetirizine (ZYRTEC) 10 MG tablet, Take 10 mg by mouth daily., Disp: , Rfl:  .  cholecalciferol (VITAMIN D) 1000 UNITS tablet, Take 1,000 Units by mouth daily., Disp: , Rfl:  .  conjugated estrogens (PREMARIN) vaginal cream, Apply twice a week at bed time, Disp: 42.5 g, Rfl: 12 .  fish oil-omega-3 fatty acids 1000 MG capsule, Take 2 g by mouth daily., Disp: , Rfl:  .  fluticasone (FLONASE) 50 MCG/ACT nasal spray, Place into both nostrils daily., Disp: , Rfl:  .  levothyroxine (SYNTHROID, LEVOTHROID) 88 MCG tablet, Take 88 mcg by mouth daily before breakfast., Disp: , Rfl:  .  naproxen (NAPROSYN) 500 MG tablet, , Disp: , Rfl: 1  Allergies  Allergen Reactions  . Aminoquinolines   . Azithromycin   . Bactrim [Sulfamethoxazole-Trimethoprim]   .  Clarithromycin     Other reaction(s): Other (See Comments) UTI  . Vicodin [Hydrocodone-Acetaminophen] Nausea And Vomiting   Review of Systems  All other systems reviewed and are negative.  Objective:   Vitals:   01/12/17 1346  BP: (!) 145/71  Pulse: 78  Resp: 16    General: Well developed, nourished, in no acute distress, alert and oriented x3   Dermatological: Skin is warm, dry and supple bilateral. Nails x 10 are well maintained; remaining integument appears unremarkable at this time. There are no open sores, no preulcerative lesions, no rash or signs of infection present.  Vascular: Dorsalis Pedis artery and Posterior Tibial artery pedal pulses are 2/4 bilateral with immedate capillary fill time. Pedal hair growth present. No varicosities and no lower extremity edema present bilateral.   Neruologic: Grossly intact via light touch bilateral. Vibratory intact via tuning fork bilateral. Protective threshold with Semmes Wienstein monofilament intact to all pedal sites bilateral. Patellar and Achilles deep tendon reflexes 2+ bilateral. No Babinski or clonus noted bilateral.   Musculoskeletal: No gross boney pedal deformities bilateral. No pain, crepitus, or limitation noted with foot and ankle range of motion bilateral. Muscular strength 5/5 in all groups tested bilateral.She has pain on palpation medially continue tubercle of the left heel.  She has pain on palpation of the cuboid and fourth and fifth metatarsal articulation. She has pain on frontal plane range of motion in this area. She also has tenderness on palpation of the anterior talofibular ligament with an overlying lipoma which is painful.  Gait: Unassisted, Nonantalgic.    Radiographs:  Radiographs taken today do not demonstrate any fracture that is visible. Otherwise she does demonstrate large amount of fluid around her Achilles tendon also in the inferior fat pad area of the heel she also has a soft tissue increase in  density of plantar fascia calcaneal insertion site with a mild plantar is fully oriented calcaneal heel spur.  Assessment & Plan:   Assessment: Plantar fasciitis lateral compensatory syndrome. Chronic ankle sprain left. Lipoma left ankle.  Plan: Discussed etiology pathology conservative versus surgical therapies. At this point I started her on Medrol Dosepak to be followed by meloxicam. Injected the plantar left heel with Kenalog and local anesthetic. Placed her in plantar fascia brace and night splint. Also dispensed an anklet for compression of the lipoma. She was dispensed both oral and written home-going instructions as well as stretching exercises I will follow-up with her in 1 month.     Sandra Roy T. [place], [place]

## 2017-01-29 ENCOUNTER — Telehealth: Payer: Self-pay | Admitting: Podiatry

## 2017-01-29 NOTE — Telephone Encounter (Signed)
I saw Dr. Milinda Pointer on 01 October and he prescribed meloxicam for me to take. I was wondering if I could take ibuprofen with this medication. I'm about to go on a trip and sometimes my back hurts or my knee will hurt from where I am favoring this foot. If someone could please call me back and let me know. My number is [phone]. Thank you.

## 2017-01-30 NOTE — Telephone Encounter (Signed)
Returned patient call and informed her that she should not take Ibuprofen and Meloxicam together. Informed her that  She can take Tylenol, but spread doses out.   She verbalized understanding

## 2017-02-09 ENCOUNTER — Ambulatory Visit (INDEPENDENT_AMBULATORY_CARE_PROVIDER_SITE_OTHER): Payer: BLUE CROSS/BLUE SHIELD | Admitting: Podiatry

## 2017-02-09 ENCOUNTER — Encounter: Payer: Self-pay | Admitting: Podiatry

## 2017-02-09 DIAGNOSIS — M7752 Other enthesopathy of left foot: Secondary | ICD-10-CM

## 2017-02-09 DIAGNOSIS — M722 Plantar fascial fibromatosis: Secondary | ICD-10-CM | POA: Diagnosis not present

## 2017-02-09 DIAGNOSIS — M779 Enthesopathy, unspecified: Principal | ICD-10-CM

## 2017-02-09 DIAGNOSIS — M778 Other enthesopathies, not elsewhere classified: Secondary | ICD-10-CM

## 2017-02-09 NOTE — Progress Notes (Signed)
Sandra Roy presents today states that her plantar fasciitis is doing better. She states that it started hurting her. She points to the subtalar joint of the left foot.  Objective: Vital signs are stable she is alert and oriented 3 no pain on palpation medially continued tubercle of the left heel. She does have direct pain on palpation sinus tarsi and subtalar joint motion is painful.  Assessment: Resolving plantar fasciitis left painful subtalar joint capsulitis left.  Plan: I encouraged her to continue all of her conservative therapies. I injected the subtalar joint today. I will follow-up with her in 1 month if necessary.

## 2017-03-23 ENCOUNTER — Ambulatory Visit: Payer: BLUE CROSS/BLUE SHIELD | Admitting: Podiatry

## 2017-04-01 ENCOUNTER — Ambulatory Visit (INDEPENDENT_AMBULATORY_CARE_PROVIDER_SITE_OTHER): Payer: BLUE CROSS/BLUE SHIELD

## 2017-04-01 ENCOUNTER — Other Ambulatory Visit: Payer: Self-pay | Admitting: Podiatry

## 2017-04-01 ENCOUNTER — Ambulatory Visit: Payer: BLUE CROSS/BLUE SHIELD | Admitting: Podiatry

## 2017-04-01 DIAGNOSIS — M778 Other enthesopathies, not elsewhere classified: Secondary | ICD-10-CM

## 2017-04-01 DIAGNOSIS — S92335A Nondisplaced fracture of third metatarsal bone, left foot, initial encounter for closed fracture: Secondary | ICD-10-CM

## 2017-04-01 DIAGNOSIS — S92225A Nondisplaced fracture of lateral cuneiform of left foot, initial encounter for closed fracture: Secondary | ICD-10-CM

## 2017-04-01 DIAGNOSIS — M779 Enthesopathy, unspecified: Principal | ICD-10-CM

## 2017-04-01 NOTE — Progress Notes (Signed)
She presents today with pain to the dorsal lateral aspect of her left foot after a fall a week or so ago.  She states that she fell forward of the foot rolled underneath her inverted.  Objective: Vital signs are stable she is alert oriented x3 she has pain with palpation overlying the third metatarsal proximally and the tarsus.  Radiograph taken today demonstrate what appears to be a fracture of the lateral portion of the lateral cuneiform possible fracture of the base of the third metatarsal nondisplaced.  Assessment fracture third metatarsal third cuneiform.  Plan she has a Cam walker boot at home I have recommended that she wear this cam walker as if it were a cast and I will follow-up with her in 4-6 weeks.  Another x-ray will be taken at that time.

## 2017-04-23 ENCOUNTER — Other Ambulatory Visit: Payer: Self-pay | Admitting: *Deleted

## 2017-04-23 MED ORDER — ALPRAZOLAM 0.5 MG PO TABS: 0.5000 mg | ORAL_TABLET | Freq: Every evening | ORAL | 3 refills | Status: DC | PRN

## 2017-04-23 NOTE — Telephone Encounter (Signed)
Rx called in 

## 2017-04-23 NOTE — Telephone Encounter (Signed)
Pt will continue care with you annual scheduled on 08/17/17, refill on Xanax 0.5 mg tablet per Dr.Fernandez note "Patient takes Xanax 0.5 mg on a when necessary basis due to anxiety since her grandson is being deployed overseas"

## 2017-05-06 ENCOUNTER — Ambulatory Visit (INDEPENDENT_AMBULATORY_CARE_PROVIDER_SITE_OTHER): Payer: BLUE CROSS/BLUE SHIELD

## 2017-05-06 ENCOUNTER — Ambulatory Visit: Payer: BLUE CROSS/BLUE SHIELD | Admitting: Podiatry

## 2017-05-06 ENCOUNTER — Encounter: Payer: Self-pay | Admitting: Podiatry

## 2017-05-06 DIAGNOSIS — S92902A Unspecified fracture of left foot, initial encounter for closed fracture: Secondary | ICD-10-CM

## 2017-05-06 DIAGNOSIS — S92335D Nondisplaced fracture of third metatarsal bone, left foot, subsequent encounter for fracture with routine healing: Secondary | ICD-10-CM

## 2017-05-06 DIAGNOSIS — S92225D Nondisplaced fracture of lateral cuneiform of left foot, subsequent encounter for fracture with routine healing: Secondary | ICD-10-CM

## 2017-05-06 HISTORY — DX: Unspecified fracture of left foot, initial encounter for closed fracture: S92.902A

## 2017-05-06 NOTE — Progress Notes (Signed)
She presents today for follow-up of a crack to the base of her third metatarsal and the cuneiform states that it feels a whole lot better and she is been very religious about wearing her boot but is starting to cause some right hip pain.  Objective: Vital signs are stable she is alert and oriented x3.  She has much decrease in edema across the dorsum of the foot she has no pain on palpation or range of motion.  Frontal plane range of motion inversion and eversion asymptomatic.  Radiographs taken today demonstrate a healing crack at the base of the articulation.  Assessment: Well-healing fracture third metatarsal base left foot.  Plan: I will follow-up with her on an as-needed basis.  Instructed her to not be overactive on the foot for another few weeks.

## 2017-05-13 ENCOUNTER — Ambulatory Visit: Payer: BLUE CROSS/BLUE SHIELD | Admitting: Podiatry

## 2017-07-21 ENCOUNTER — Telehealth: Payer: Self-pay | Admitting: Podiatry

## 2017-07-21 NOTE — Telephone Encounter (Signed)
Patient called has an appt tomorrow with Dr. Milinda Pointer,. She has some fatty tumors on her ankle that she wants removed and wants to know if he can do this in the office? And also if she will need a driver for her appt tomorrow?  Please advise.

## 2017-07-21 NOTE — Telephone Encounter (Signed)
Absolutely NOT

## 2017-07-22 ENCOUNTER — Telehealth: Payer: Self-pay | Admitting: *Deleted

## 2017-07-22 ENCOUNTER — Ambulatory Visit: Payer: BLUE CROSS/BLUE SHIELD | Admitting: Podiatry

## 2017-07-22 ENCOUNTER — Encounter: Payer: Self-pay | Admitting: Podiatry

## 2017-07-22 DIAGNOSIS — D492 Neoplasm of unspecified behavior of bone, soft tissue, and skin: Secondary | ICD-10-CM | POA: Diagnosis not present

## 2017-07-22 DIAGNOSIS — D179 Benign lipomatous neoplasm, unspecified: Secondary | ICD-10-CM

## 2017-07-22 NOTE — Telephone Encounter (Signed)
"  I scheduled surgery with Dr. Milinda Pointer for May 10.  I got home and realized I can't do it on that date."  Dr. Stephenie Acres next available is May 31.  "I can't do it then either.  I guess I will have to go after then."  He can do it on June 7.  "That date is good, schedule it then."

## 2017-07-22 NOTE — Patient Instructions (Signed)
Pre-Operative Instructions  Congratulations, you have decided to take an important step towards improving your quality of life.  You can be assured that the doctors and staff at [facility] will be with you every step of the way.  Here are some important things you should know:  1. Plan to be at the surgery center/hospital at least 1 (one) hour prior to your scheduled time, unless otherwise directed by the surgical center/hospital staff.  You must have a responsible adult accompany you, remain during the surgery and drive you home.  Make sure you have directions to the surgical center/hospital to ensure you arrive on time. 2. If you are having surgery at [facility] or [facility] hospitals, you will need a copy of your medical history and physical form from your family physician within one month prior to the date of surgery. We will give you a form for your primary physician to complete.  3. We make every effort to accommodate the date you request for surgery.  However, there are times where surgery dates or times have to be moved.  We will contact you as soon as possible if a change in schedule is required.   4. No aspirin/ibuprofen for one week before surgery.  If you are on aspirin, any non-steroidal anti-inflammatory medications (Mobic, Aleve, Ibuprofen) should not be taken seven (7) days prior to your surgery.  You make take Tylenol for pain prior to surgery.  5. Medications - If you are taking daily heart and blood pressure medications, seizure, reflux, allergy, asthma, anxiety, pain or diabetes medications, make sure you notify the surgery center/hospital before the day of surgery so they can tell you which medications you should take or avoid the day of surgery. 6. No food or drink after midnight the night before surgery unless directed otherwise by surgical center/hospital staff. 7. No alcoholic beverages 24-hours prior to surgery.  No smoking 24-hours prior or 24-hours after  surgery. 8. Wear loose pants or shorts. They should be loose enough to fit over bandages, boots, and casts. 9. Don't wear slip-on shoes. Sneakers are preferred. 10. Bring your boot with you to the surgery center/hospital.  Also bring crutches or a walker if your physician has prescribed it for you.  If you do not have this equipment, it will be provided for you after surgery. 11. If you have not been contacted by the surgery center/hospital by the day before your surgery, call to confirm the date and time of your surgery. 12. Leave-time from work may vary depending on the type of surgery you have.  Appropriate arrangements should be made prior to surgery with your employer. 13. Prescriptions will be provided immediately following surgery by your doctor.  Fill these as soon as possible after surgery and take the medication as directed. Pain medications will not be refilled on weekends and must be approved by the doctor. 14. Remove nail polish on the operative foot and avoid getting pedicures prior to surgery. 15. Wash the night before surgery.  The night before surgery wash the foot and leg well with water and the antibacterial soap provided. Be sure to pay special attention to beneath the toenails and in between the toes.  Wash for at least three (3) minutes. Rinse thoroughly with water and dry well with a towel.  Perform this wash unless told not to do so by your physician.  Enclosed: 1 Ice pack (please put in freezer the night before surgery)   1 Hibiclens skin cleaner     Pre-op instructions  If you have any questions regarding the instructions, please do not hesitate to call our office.  [place]: [address], [place], [place] [address] -- [phone]  [place]: [address] [place], [place] [address] -- [phone]  [place]: [address]  [place], [place] [address] -- [phone]  [place]: [address], [address], [place], [place] [address] -- [phone]  Website:  [email or web address] 

## 2017-07-22 NOTE — Telephone Encounter (Signed)
I made the change on my scheduling sheet to reflect new date. Thanks!

## 2017-07-22 NOTE — Telephone Encounter (Signed)
I returned patient call and informed her that he does not remove fatty tumors in office.  She will have to come in and discuss the surgical procedure with Dr. Milinda Pointer and sign consent forms if she decides on surgery.  She will keep her appt this morning to discuss.

## 2017-07-22 NOTE — Progress Notes (Signed)
She presents today for a chief complaint of painful soft tissue masses to the anterior lateral ankles.  She states they have been bothering her for quite some time she is not even able to wear boots.  She states that any shoes that ride high around her ankles really hurt her and cause some numbness and tingling.  She also states that she has pain when she places her right foot over her left foot if she is pressing on that  tumorous area.  She denies changes in her past medical history medications allergy surgeries and social history.  States that she is due for colonoscopy but otherwise she is been doing well.  Objective: Vital signs are stable she is alert and oriented x3.  Pulses are strongly palpable.  No venous insufficiency identified.  No lymphedema identified.  She has 4 cm x 4 cm x 2-1/2 cm soft tissue mass is nonpulsatile in nature overlying the sinus tarsi area of the bilateral ankle these are located in the anterior lateral ankle sinus tarsi and are tender with palpation.  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  Plan: Martin Majestic over a consent form today line by line number by number given her ample time to ask questions she self it regarding excision of soft tissue tumors which are painful to the bilateral anterior ankles.  I answered all the questions regarding these procedures to the best my ability in layman's terms.  She understands and is amenable to it.  She also understands that there will be it may be some continued swelling afterwards as well as possible postop complications which may include but are not limited to postop pain bleeding swelling infection recurrence need for further surgery overcorrection under correction loss of sensation loss of skin.  She understands this is amenable to it.  Dispensed [employer] shoes today as well as preoperative information regarding the drains were specialty surgical center and anesthesia group.  We will follow-up with her in the near  future for surgical intervention.

## 2017-08-17 ENCOUNTER — Encounter: Payer: Self-pay | Admitting: Obstetrics & Gynecology

## 2017-08-17 ENCOUNTER — Ambulatory Visit (INDEPENDENT_AMBULATORY_CARE_PROVIDER_SITE_OTHER): Payer: BLUE CROSS/BLUE SHIELD | Admitting: Obstetrics & Gynecology

## 2017-08-17 VITALS — BP 128/84 | Ht 62.5 in | Wt 192.0 lb

## 2017-08-17 DIAGNOSIS — Z9079 Acquired absence of other genital organ(s): Secondary | ICD-10-CM | POA: Diagnosis not present

## 2017-08-17 DIAGNOSIS — Z90710 Acquired absence of both cervix and uterus: Secondary | ICD-10-CM | POA: Diagnosis not present

## 2017-08-17 DIAGNOSIS — Z780 Asymptomatic menopausal state: Secondary | ICD-10-CM

## 2017-08-17 DIAGNOSIS — Z90722 Acquired absence of ovaries, bilateral: Secondary | ICD-10-CM

## 2017-08-17 DIAGNOSIS — Z01419 Encounter for gynecological examination (general) (routine) without abnormal findings: Secondary | ICD-10-CM | POA: Diagnosis not present

## 2017-08-17 NOTE — Patient Instructions (Signed)
  1. Well female exam with routine gynecological exam Normal gynecologic exam status post TAH/BSO.  No longer needs a Pap test.  Breast exam normal.  Last mammogram normal at [facility] in 2018.  Will obtain results.  Patient will schedule screening mammogram this year.  Colonoscopy recently done May 2019.  Will obtain results.  Health labs with family physician.  2. S/P TAH-BSO  3. Menopause present Well on no hormone replacement therapy.  Status post total hysterectomy.  Last bone density May 2018 showed only mild osteopenia.  Vitamin D supplements, calcium rich nutrition and regular weightbearing physical activity recommended.  Margaretha Sheffield, it was a pleasure meeting you today!

## 2017-08-17 NOTE — Progress Notes (Signed)
Sandra Roy [birth year] [id number]   History:    61 y.o. [id number] Married  RP:  Established patient presenting for annual gyn exam   HPI: S/P TAH/BSO for Left Adnexal mass/menorrhagia, patho benign in 2002.  Menopause on no HRT.  Stopped Estrogen cream as she is doing well with IC without it.  No pelvic pain.  Urine/BMs wnl.  Breasts wnl.  BMI 34.56.  Health labs with family physician.  Past medical history,surgical history, family history and social history were all reviewed and documented in the EPIC chart.  Gynecologic History No LMP recorded. Patient has had a hysterectomy. Contraception: status post hysterectomy Last Pap: 2011. Results were: Negative Last mammogram: 2018. Results were: Negative per patient, at [place]: 08/2016 mild Osteopenia Colonoscopy: 08/2017  Obstetric History OB History  Gravida Para Term Preterm AB Living  4 3 3   1 3   SAB TAB Ectopic Multiple Live Births  1       3    # Outcome Date GA Lbr Len/2nd Weight Sex Delivery Anes PTL Lv  4 SAB           3 Term     F Vag-Spont  N LIV  2 Term     M Vag-Spont  N LIV  1 Term     F Vag-Spont  N LIV     ROS: A ROS was performed and pertinent positives and negatives are included in the history.  GENERAL: No fevers or chills. HEENT: No change in vision, no earache, sore throat or sinus congestion. NECK: No pain or stiffness. CARDIOVASCULAR: No chest pain or pressure. No palpitations. PULMONARY: No shortness of breath, cough or wheeze. GASTROINTESTINAL: No abdominal pain, nausea, vomiting or diarrhea, melena or bright red blood per rectum. GENITOURINARY: No urinary frequency, urgency, hesitancy or dysuria. MUSCULOSKELETAL: No joint or muscle pain, no back pain, no recent trauma. DERMATOLOGIC: No rash, no itching, no lesions. ENDOCRINE: No polyuria, polydipsia, no heat or cold intolerance. No recent change in weight. HEMATOLOGICAL: No anemia or easy bruising or bleeding. NEUROLOGIC: No headache,  seizures, numbness, tingling or weakness. PSYCHIATRIC: No depression, no loss of interest in normal activity or change in sleep pattern.     Exam:   BP 128/84   Ht 5' 2.5" (1.588 m)   Wt 192 lb (87.1 kg)   BMI 34.56 kg/m   Body mass index is 34.56 kg/m.  General appearance : Well developed well nourished female. No acute distress HEENT: Eyes: no retinal hemorrhage or exudates,  Neck supple, trachea midline, no carotid bruits, no thyroidmegaly Lungs: Clear to auscultation, no rhonchi or wheezes, or rib retractions  Heart: Regular rate and rhythm, no murmurs or gallops Breast:Examined in sitting and supine position were symmetrical in appearance, no palpable masses or tenderness,  no skin retraction, no nipple inversion, no nipple discharge, no skin discoloration, no axillary or supraclavicular lymphadenopathy Abdomen: no palpable masses or tenderness, no rebound or guarding Extremities: no edema or skin discoloration or tenderness  Pelvic: Vulva: Normal             Vagina: No gross lesions or discharge  Cervix/Uterus absent  Adnexa  Without masses or tenderness  Anus: Normal   Assessment/Plan:  62 y.o. female for annual exam   1. Well female exam with routine gynecological exam Normal gynecologic exam status post TAH/BSO.  No longer needs a Pap test.  Breast exam normal.  Last mammogram normal at [facility] in 2018.  Will obtain results.  Patient will schedule screening mammogram this year.  Colonoscopy recently done May 2019.  Will obtain results.  Health labs with family physician.  2. S/P TAH-BSO  3. Menopause present Well on no hormone replacement therapy.  Status post total hysterectomy.  Last bone density May 2018 showed only mild osteopenia.  Vitamin D supplements, calcium rich nutrition and regular weightbearing physical activity recommended.  Princess Bruins MD, 11:00 AM 08/17/2017

## 2017-09-16 ENCOUNTER — Other Ambulatory Visit: Payer: Self-pay | Admitting: Podiatry

## 2017-09-16 MED ORDER — CEPHALEXIN 500 MG PO CAPS: 500.0000 mg | ORAL_CAPSULE | Freq: Three times a day (TID) | ORAL | 0 refills | Status: DC

## 2017-09-16 MED ORDER — HYDROMORPHONE HCL 4 MG PO TABS: 4.0000 mg | ORAL_TABLET | Freq: Four times a day (QID) | ORAL | 0 refills | Status: AC | PRN

## 2017-09-16 MED ORDER — ONDANSETRON HCL 4 MG PO TABS: 4.0000 mg | ORAL_TABLET | Freq: Three times a day (TID) | ORAL | 0 refills | Status: DC | PRN

## 2017-09-18 ENCOUNTER — Encounter: Payer: Self-pay | Admitting: Podiatry

## 2017-09-18 DIAGNOSIS — D492 Neoplasm of unspecified behavior of bone, soft tissue, and skin: Secondary | ICD-10-CM | POA: Diagnosis not present

## 2017-09-18 HISTORY — PX: ANKLE SURGERY: SHX546

## 2017-09-21 ENCOUNTER — Telehealth: Payer: Self-pay | Admitting: *Deleted

## 2017-09-21 NOTE — Telephone Encounter (Addendum)
POST OP CALL-    1) General condition stated by the patient: A LITTLE SORE  2) Is the pt having pain? YES  3) Pain score: 2 IF ELEVATING, 8 IF ONE THEM SOME  4) Has the pt taken Rx'd pain medication, regularly or PRN? REGULARLY  5) Is the pain medication giving relief? YES  6) Any fever, chills, nausea, or vomiting, shortness of breath or tightness in calf? SOME VOMITING, "MAYBE FROM THE MEDICINE, I HADN'T EAT WHEN I TOOK IT"  7) Is the bandage clean, dry and intact? CAN'T SEE IT  8) Is there excessive tightness, bleeding or drainage coming through the bandage? NO   9) Did you understand all of the post op instruction sheet given? YES  10) Any questions or concerns regarding post op care/recovery? NO    Confirmed POV appointment with patient

## 2017-09-23 ENCOUNTER — Ambulatory Visit (INDEPENDENT_AMBULATORY_CARE_PROVIDER_SITE_OTHER): Payer: BLUE CROSS/BLUE SHIELD | Admitting: Podiatry

## 2017-09-23 ENCOUNTER — Encounter: Payer: Self-pay | Admitting: Podiatry

## 2017-09-23 ENCOUNTER — Telehealth: Payer: Self-pay | Admitting: Podiatry

## 2017-09-23 DIAGNOSIS — Z9889 Other specified postprocedural states: Secondary | ICD-10-CM

## 2017-09-23 DIAGNOSIS — D179 Benign lipomatous neoplasm, unspecified: Secondary | ICD-10-CM

## 2017-09-23 NOTE — Telephone Encounter (Signed)
Patient called and left a voicemail yesterday about her incision in her left foot. She states that it is swollen and red. The "sock" that was around it was bothering her so she took it off. She would like someone to call her back to make sure these are normally signs of the healing process. Her number is [phone]

## 2017-09-23 NOTE — Telephone Encounter (Signed)
Patient was seen in office today by Dr. Milinda Pointer

## 2017-09-23 NOTE — Progress Notes (Signed)
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  She denies fever chills nausea vomiting muscle aches pain she states that her feet have been itching her from the tape and the compression anklet.  She states that really started to bother her the last couple of days and she ended up having to take them off.  Her husband states that she is been up on her feet a lot with exception of the first 2 to 3 days.  Objective: Vital signs are stable alert and oriented x3 dry sterile dressing removed demonstrates moderate edema no erythema cellulitis drainage or odor some ecchymosis is present she has some tingling across the dorsum of her foot.  Assessment: Well-healing surgical foot.  Plan: Redressed today dresser compressive dressing follow-up with her in 1 week

## 2017-09-24 ENCOUNTER — Encounter: Payer: Self-pay | Admitting: Podiatry

## 2017-09-26 ENCOUNTER — Telehealth: Payer: Self-pay | Admitting: Sports Medicine

## 2017-09-26 NOTE — Telephone Encounter (Signed)
Patient called stating that she had surgery on last Friday and she went for dressing change on Wednesday and was found to have irritation to the skin from the dressings. Patient states that Dr. Milinda Pointer gave her an antibiotic to take by mouth and that she has put Bactine to the area and left it open. Reports that there are blisters that are looking better but is concerned if she should be doing anything else. I advised patient to monitor temperature, to rest, ice, elevate, and take pain meds. If needed may also take Benadryl. If areas worsens or symptoms progress to call back. Patient expressed understanding and thanked me for my time. -Dr. Cannon Kettle

## 2017-09-28 ENCOUNTER — Telehealth: Payer: Self-pay | Admitting: Podiatry

## 2017-09-28 NOTE — Telephone Encounter (Signed)
I returned patient call, She stated that her rash and blisters are getting a little better, but she still has a lot of itching.  She reports she is taking Benadryl but not working that great.  I informed her that she can try Zyrtec and benadryl cream to area for itching.  We discussed that she may be allergic to Latex and she agreed.  I informed her to keep an eye on area and if it continues to become worse to call office back before appt on Wednesday and we will get her in sooner.  She verbally understood

## 2017-09-28 NOTE — Telephone Encounter (Signed)
I need to speak to the nurse. I had a reaction from that bandage. I called the doctor on call over the weekend and they just told me to take a benadryl, to keep an eye on it and to check my fever. My ankles are itching like crazy. My right foot was doing good but now the rash is coming back again. If you could call me back I would appreciate it. [phone]. Thanks.

## 2017-09-30 ENCOUNTER — Encounter: Payer: Self-pay | Admitting: Podiatry

## 2017-09-30 ENCOUNTER — Ambulatory Visit (INDEPENDENT_AMBULATORY_CARE_PROVIDER_SITE_OTHER): Payer: BLUE CROSS/BLUE SHIELD | Admitting: Podiatry

## 2017-09-30 DIAGNOSIS — D179 Benign lipomatous neoplasm, unspecified: Secondary | ICD-10-CM

## 2017-09-30 DIAGNOSIS — Z9889 Other specified postprocedural states: Secondary | ICD-10-CM

## 2017-09-30 NOTE — Progress Notes (Signed)
She presents today status post excision lipomas bilateral ankles.  Still having problems with irritation and draining she states.  Denies fever chills nausea vomiting muscle aches and pains.  Objective: Mild erythema it appears that the sutures are in place but she is having a reaction to the sutures resulting in some mild dehiscence of the wound.  Assessment slowly healing surgical sites bilateral ankles probable irritation secondary to allergy to sutures.  Plan: Redressed today dresser compressive dressing follow-up with her in 1 week for suture removal.  Will more than likely start soaking at that time.

## 2017-10-05 ENCOUNTER — Ambulatory Visit (INDEPENDENT_AMBULATORY_CARE_PROVIDER_SITE_OTHER): Payer: BLUE CROSS/BLUE SHIELD | Admitting: Podiatry

## 2017-10-05 DIAGNOSIS — D179 Benign lipomatous neoplasm, unspecified: Secondary | ICD-10-CM

## 2017-10-06 NOTE — Progress Notes (Signed)
She presents today for follow-up of excision soft tissue masses lateral ankles.  Last time she was and she had a lot of erythema and skin breakdown associated with contact dermatitis and the Monocryl suture.  Objective: Monocryl suture was removed today margins appear to be fairly well coapted I do not see any signs of infection pulses remain palpable.  Erythema has gone down edema has gone down she is much happier feels much better.  Replaced the large Band-Aids with a small Band-Aid just over the incision site.  Told her that she should continue to keep those elevated as much as possible.  Assessment: Well-healing excision soft tissue tumors lateral ankles.  Plan: Follow-up with me in 1 month.  She will call sooner if there are questions or concerns.  I told her not to get any water until her incision sites have gone on to heal.

## 2017-11-04 ENCOUNTER — Ambulatory Visit: Payer: BLUE CROSS/BLUE SHIELD | Admitting: Podiatry

## 2017-11-11 ENCOUNTER — Ambulatory Visit: Payer: BLUE CROSS/BLUE SHIELD | Admitting: Podiatry

## 2017-12-02 ENCOUNTER — Encounter: Payer: Self-pay | Admitting: Podiatry

## 2017-12-02 ENCOUNTER — Ambulatory Visit (INDEPENDENT_AMBULATORY_CARE_PROVIDER_SITE_OTHER): Payer: BLUE CROSS/BLUE SHIELD | Admitting: Podiatry

## 2017-12-02 DIAGNOSIS — D179 Benign lipomatous neoplasm, unspecified: Secondary | ICD-10-CM

## 2017-12-02 DIAGNOSIS — Z9889 Other specified postprocedural states: Secondary | ICD-10-CM

## 2017-12-02 NOTE — Progress Notes (Signed)
Sandra Roy presents today for follow-up of her excision lipomas bilateral ankles.  Date of surgery September 18, 2017 states that she still has some itching but all in all she is doing much better she is very happy with the outcome stating that she can at least see her ankles at this point.  States that her mother has recently passed away and that was one reason she failed to follow-up for her visits.  Objective: Vital signs are stable she is alert and oriented x3.  Pulses are palpable.  Surgical site is gone on to heal uneventfully and contour of her ankle appears to be perfectly normal.  Assessment: Excision lipomas anterior lateral ankles.  Plan: Follow-up with me on an as-needed basis.

## 2018-01-19 ENCOUNTER — Ambulatory Visit: Payer: Self-pay | Admitting: Orthopedic Surgery

## 2018-01-27 NOTE — H&P (Signed)
TOTAL KNEE ADMISSION H&P  Patient is being admitted for right total knee arthroplasty.  Subjective:  Chief Complaint:right knee pain.  HPI: Sandra Roy, 62 y.o. female, has a history of pain and functional disability in the right knee due to arthritis and has failed non-surgical conservative treatments for greater than 12 weeks to includecorticosteriod injections, viscosupplementation injections, supervised PT with diminished ADL's post treatment, use of assistive devices and activity modification.  Onset of symptoms was gradual, starting 3 years ago with gradually worsening course since that time. The patient noted no past surgery on the right knee(s).  Patient currently rates pain in the right knee(s) at 3 out of 10 with activity. Patient has worsening of pain with activity and weight bearing, pain that interferes with activities of daily living, pain with passive range of motion and joint swelling.  Patient has evidence of subchondral sclerosis, periarticular osteophytes and joint space narrowing by imaging studies. There is no active infection.  Patient Active Problem List   Diagnosis Date Noted  . History of colonic polyps 06/26/2014  . Anxiety 06/26/2014  . Dyspareunia 06/22/2013  . Vaginal atrophy 06/22/2013  . Factor 5 Leiden mutation, heterozygous ([facility]) 06/21/2012  . Osteopenia   . Hashimoto's disease    Past Medical History:  Diagnosis Date  . Anxiety   . Autoimmune thyroiditis   . Depression   . Factor V Leiden mutation ([place])   . Hashimoto's disease   . Hypoglycemic syndrome   . NSVD (normal spontaneous vaginal delivery)    x3  . Osteopenia     Past Surgical History:  Procedure Laterality Date  . ABDOMINAL HYSTERECTOMY  2003   TAH/BSO  . DILATION AND CURETTAGE OF UTERUS    . ENDOMETRIAL ABLATION    . KNEE ARTHROSCOPY  2010   RIGHT  . PELVIC LAPAROSCOPY     LSO  . TUBAL LIGATION  1976  . WRIST SURGERY  2003   RIGHT,,, CARPAL TUNNEL    No current  facility-administered medications for this encounter.    Current Outpatient Medications  Medication Sig Dispense Refill Last Dose  . ALPRAZolam (XANAX) 0.5 MG tablet Take 1 tablet (0.5 mg total) by mouth at bedtime as needed for anxiety. 30 tablet 3 Taking  . aspirin 81 MG tablet Take 81 mg by mouth daily.   Taking  . cetirizine (ZYRTEC) 10 MG tablet Take 10 mg by mouth daily.   Taking  . DUEXIS 800-26.6 MG TABS Take 1 tablet by mouth 3 (three) times daily.  3   . fluticasone (FLONASE) 50 MCG/ACT nasal spray Place into both nostrils daily.   Taking  . HYDROmorphone (DILAUDID) 4 MG tablet hydromorphone 4 mg tablet     . levothyroxine (SYNTHROID, LEVOTHROID) 88 MCG tablet Take 88 mcg by mouth daily before breakfast.   Taking  . naproxen (NAPROSYN) 500 MG tablet   1 Taking  . omeprazole (PRILOSEC) 40 MG capsule   4 Taking  . ondansetron (ZOFRAN) 4 MG tablet Take 1 tablet (4 mg total) by mouth every 8 (eight) hours as needed for nausea or vomiting. 20 tablet 0   . phentermine 37.5 MG capsule Take 37.5 mg by mouth every morning.   Taking  . valACYclovir (VALTREX) 1000 MG tablet   0 Taking  . Vitamin D, Ergocalciferol, (DRISDOL) 50000 units CAPS capsule   0 Taking   Allergies  Allergen Reactions  . Aminoquinolines   . Azithromycin   . Bactrim [Sulfamethoxazole-Trimethoprim]   . Clarithromycin  Other reaction(s): Other (See Comments) UTI  . Naproxen Sodium     Acid reflux  . Vicodin [Hydrocodone-Acetaminophen] Nausea And Vomiting    Social History   Tobacco Use  . Smoking status: Former Smoker    Last attempt to quit: 06/17/1981    Years since quitting: 36.6  . Smokeless tobacco: Never Used  Substance Use Topics  . Alcohol use: No    Alcohol/week: 0.0 standard drinks    Family History  Problem Relation Age of Onset  . Diabetes Father   . Heart disease Father   . Hypertension Sister   . Cancer Sister        LUNG  (SMOKER)  . Diabetes Brother   . Hypertension Brother       Review of Systems  Constitutional: Negative.   HENT: Negative.   Eyes: Negative.   Respiratory: Negative.   Cardiovascular: Negative.   Gastrointestinal: Negative.   Genitourinary: Negative.   Musculoskeletal: Positive for joint pain.  Skin: Negative.   Neurological: Negative.   Endo/Heme/Allergies: Negative.   Psychiatric/Behavioral: Negative.     Objective:  Physical Exam  Constitutional: She is oriented to person, place, and time. She appears well-developed.  HENT:  Head: Normocephalic.  Eyes: EOM are normal.  Neck: Normal range of motion.  Cardiovascular: Normal rate and intact distal pulses.  Respiratory: Effort normal and breath sounds normal.  GI: Soft.  Genitourinary:  Genitourinary Comments: Deferred  Neurological: She is alert and oriented to person, place, and time.  Skin: Skin is warm and dry.  Psychiatric: Her behavior is normal.    Vital signs in last 24 hours: BP: ()/()  Arterial Line BP: ()/()   Labs:   Estimated body mass index is 34.56 kg/m as calculated from the following:   Height as of 08/17/17: 5' 2.5" (1.588 m).   Weight as of 08/17/17: 87.1 kg.   Imaging Review Plain radiographs demonstrate moderate degenerative joint disease of the right knee(s). The overall alignment ismild varus. The bone quality appears to be good for age and reported activity level.   Preoperative templating of the joint replacement has been completed, documented, and submitted to the Operating Room personnel in order to optimize intra-operative equipment management.   Anticipated LOS equal to or greater than 2 midnights due to - Age 16 and older with one or more of the following:  - Obesity  - Expected need for hospital services (PT, OT, Nursing) required for safe  discharge  - Anticipated need for postoperative skilled nursing care or inpatient rehab  - Active co-morbidities: None OR   - Unanticipated findings during/Post Surgery: None  - Patient is a high  risk of re-admission due to: None     Assessment/Plan:  End stage arthritis, right knee   The patient history, physical examination, clinical judgment of the provider and imaging studies are consistent with end stage degenerative joint disease of the right knee(s) and total knee arthroplasty is deemed medically necessary. The treatment options including medical management, injection therapy arthroscopy and arthroplasty were discussed at length. The risks and benefits of total knee arthroplasty were presented and reviewed. The risks due to aseptic loosening, infection, stiffness, patella tracking problems, thromboembolic complications and other imponderables were discussed. The patient acknowledged the explanation, agreed to proceed with the plan and consent was signed. Patient is being admitted for inpatient treatment for surgery, pain control, PT, OT, prophylactic antibiotics, VTE prophylaxis, progressive ambulation and ADL's and discharge planning. The patient is planning to be discharged home  with home health services.   Will use IV tranexamic acid. Contraindications and adverse affects of Tranexamic acid discussed in detail. Patient denies any of these at this time and understands the risks and benefits.

## 2018-02-04 ENCOUNTER — Encounter (HOSPITAL_COMMUNITY): Payer: Self-pay

## 2018-02-04 NOTE — Pre-Procedure Instructions (Signed)
Medical clearance and last office visit note from Dr. Hilma Favors in hard chart.

## 2018-02-04 NOTE — Patient Instructions (Addendum)
Your procedure is scheduled on: Friday, Nov. 1, 2019   Surgery Time:  10:15AM-12:30PM   Report to [facility]  Entrance    Report to admitting at 7:45 AM   Call this number if you have problems the morning of surgery [phone]   Do not eat food or drink liquids :After Midnight.   Brush your teeth the morning of surgery.   Do NOT smoke after Midnight   Take these medicines the morning of surgery with A SIP OF WATER: Levothyroxine, Xanax if needed                               You may not have any metal on your body including hair pins, jewelry, and body piercings             Do not wear make-up, lotions, powders, perfumes/cologne, or deodorant             Do not wear nail polish.  Do not shave  48 hours prior to surgery.                Do not bring valuables to the hospital. [place].   Contacts, dentures or bridgework may not be worn into surgery.   Leave suitcase in the car. After surgery it may be brought to your room.    Special Instructions: Bring a copy of your healthcare power of attorney and living will documents         the day of surgery if you haven't scanned them in before.              Please read over the following fact sheets you were given:  [facility] - Preparing for Surgery Before surgery, you can play an important role.  Because skin is not sterile, your skin needs to be as free of germs as possible.  You can reduce the number of germs on your skin by washing with CHG (chlorahexidine gluconate) soap before surgery.  CHG is an antiseptic cleaner which kills germs and bonds with the skin to continue killing germs even after washing. Please DO NOT use if you have an allergy to CHG or antibacterial soaps.  If your skin becomes reddened/irritated stop using the CHG and inform your nurse when you arrive at Short Stay. Do not shave (including legs and underarms) for at least 48 hours prior to the  first CHG shower.  You may shave your face/neck.  Please follow these instructions carefully:  1.  Shower with CHG Soap the night before surgery and the  morning of surgery.  2.  If you choose to wash your hair, wash your hair first as usual with your normal  shampoo.  3.  After you shampoo, rinse your hair and body thoroughly to remove the shampoo.                             4.  Use CHG as you would any other liquid soap.  You can apply chg directly to the skin and wash.  Gently with a scrungie or clean washcloth.  5.  Apply the CHG Soap to your body ONLY FROM THE NECK DOWN.   Do   not use on face/ open  Wound or open sores. Avoid contact with eyes, ears mouth and   genitals (private parts).                       Wash face,  Genitals (private parts) with your normal soap.             6.  Wash thoroughly, paying special attention to the area where your    surgery  will be performed.  7.  Thoroughly rinse your body with warm water from the neck down.  8.  DO NOT shower/wash with your normal soap after using and rinsing off the CHG Soap.                9.  Pat yourself dry with a clean towel.            10.  Wear clean pajamas.            11.  Place clean sheets on your bed the night of your first shower and do not  sleep with pets. Day of Surgery : Do not apply any lotions/deodorants the morning of surgery.  Please wear clean clothes to the hospital/surgery center.  FAILURE TO FOLLOW THESE INSTRUCTIONS MAY RESULT IN THE CANCELLATION OF YOUR SURGERY  PATIENT SIGNATURE_________________________________  NURSE SIGNATURE__________________________________  ________________________________________________________________________   Sandra Roy  An incentive spirometer is a tool that can help keep your lungs clear and active. This tool measures how well you are filling your lungs with each breath. Taking long deep breaths may help reverse or decrease the chance  of developing breathing (pulmonary) problems (especially infection) following:  A long period of time when you are unable to move or be active. BEFORE THE PROCEDURE   If the spirometer includes an indicator to show your best effort, your nurse or respiratory therapist will set it to a desired goal.  If possible, sit up straight or lean slightly forward. Try not to slouch.  Hold the incentive spirometer in an upright position. INSTRUCTIONS FOR USE  1. Sit on the edge of your bed if possible, or sit up as far as you can in bed or on a chair. 2. Hold the incentive spirometer in an upright position. 3. Breathe out normally. 4. Place the mouthpiece in your mouth and seal your lips tightly around it. 5. Breathe in slowly and as deeply as possible, raising the piston or the ball toward the top of the column. 6. Hold your breath for 3-5 seconds or for as long as possible. Allow the piston or ball to fall to the bottom of the column. 7. Remove the mouthpiece from your mouth and breathe out normally. 8. Rest for a few seconds and repeat Steps 1 through 7 at least 10 times every 1-2 hours when you are awake. Take your time and take a few normal breaths between deep breaths. 9. The spirometer may include an indicator to show your best effort. Use the indicator as a goal to work toward during each repetition. 10. After each set of 10 deep breaths, practice coughing to be sure your lungs are clear. If you have an incision (the cut made at the time of surgery), support your incision when coughing by placing a pillow or rolled up towels firmly against it. Once you are able to get out of bed, walk around indoors and cough well. You may stop using the incentive spirometer when instructed by your caregiver.  RISKS AND COMPLICATIONS  Take your time  so you do not get dizzy or light-headed.  If you are in pain, you may need to take or ask for pain medication before doing incentive spirometry. It is harder to  take a deep breath if you are having pain. AFTER USE  Rest and breathe slowly and easily.  It can be helpful to keep track of a log of your progress. Your caregiver can provide you with a simple table to help with this. If you are using the spirometer at home, follow these instructions: [place] IF:   You are having difficultly using the spirometer.  You have trouble using the spirometer as often as instructed.  Your pain medication is not giving enough relief while using the spirometer.  You develop fever of 100.5 F (38.1 C) or higher. SEEK IMMEDIATE MEDICAL CARE IF:   You cough up bloody sputum that had not been present before.  You develop fever of 102 F (38.9 C) or greater.  You develop worsening pain at or near the incision site. MAKE SURE YOU:   Understand these instructions.  Will watch your condition.  Will get help right away if you are not doing well or get worse. Document Released: 08/11/2006 Document Revised: 06/23/2011 Document Reviewed: 10/12/2006 Exit[employer] Patient Information 2014 ExitCare, [place].   ________________________________________________________________________  WHAT IS A BLOOD TRANSFUSION? Blood Transfusion Information  A transfusion is the replacement of blood or some of its parts. Blood is made up of multiple cells which provide different functions.  Red blood cells carry oxygen and are used for blood loss replacement.  White blood cells fight against infection.  Platelets control bleeding.  Plasma helps clot blood.  Other blood products are available for specialized needs, such as hemophilia or other clotting disorders. BEFORE THE TRANSFUSION  Who gives blood for transfusions?   Healthy volunteers who are fully evaluated to make sure their blood is safe. This is blood bank blood. Transfusion therapy is the safest it has ever been in the practice of medicine. Before blood is taken from a donor, a complete history is taken to  make sure that person has no history of diseases nor engages in risky social behavior (examples are intravenous drug use or sexual activity with multiple partners). The donor's travel history is screened to minimize risk of transmitting infections, such as malaria. The donated blood is tested for signs of infectious diseases, such as HIV and hepatitis. The blood is then tested to be sure it is compatible with you in order to minimize the chance of a transfusion reaction. If you or a relative donates blood, this is often done in anticipation of surgery and is not appropriate for emergency situations. It takes many days to process the donated blood. RISKS AND COMPLICATIONS Although transfusion therapy is very safe and saves many lives, the main dangers of transfusion include:   Getting an infectious disease.  Developing a transfusion reaction. This is an allergic reaction to something in the blood you were given. Every precaution is taken to prevent this. The decision to have a blood transfusion has been considered carefully by your caregiver before blood is given. Blood is not given unless the benefits outweigh the risks. AFTER THE TRANSFUSION  Right after receiving a blood transfusion, you will usually feel much better and more energetic. This is especially true if your red blood cells have gotten low (anemic). The transfusion raises the level of the red blood cells which carry oxygen, and this usually causes an energy increase.  The  nurse administering the transfusion will monitor you carefully for complications. HOME CARE INSTRUCTIONS  No special instructions are needed after a transfusion. You may find your energy is better. Speak with your caregiver about any limitations on activity for underlying diseases you may have. SEEK MEDICAL CARE IF:   Your condition is not improving after your transfusion.  You develop redness or irritation at the intravenous (IV) site. SEEK IMMEDIATE MEDICAL CARE  IF:  Any of the following symptoms occur over the next 12 hours:  Shaking chills.  You have a temperature by mouth above 102 F (38.9 C), not controlled by medicine.  Chest, back, or muscle pain.  People around you feel you are not acting correctly or are confused.  Shortness of breath or difficulty breathing.  Dizziness and fainting.  You get a rash or develop hives.  You have a decrease in urine output.  Your urine turns a dark color or changes to pink, red, or brown. Any of the following symptoms occur over the next 10 days:  You have a temperature by mouth above 102 F (38.9 C), not controlled by medicine.  Shortness of breath.  Weakness after normal activity.  The white part of the eye turns yellow (jaundice).  You have a decrease in the amount of urine or are urinating less often.  Your urine turns a dark color or changes to pink, red, or brown. Document Released: 03/28/2000 Document Revised: 06/23/2011 Document Reviewed: 11/15/2007 [facility] Patient Information 2014 [place], [place].  _______________________________________________________________________

## 2018-02-05 ENCOUNTER — Encounter (HOSPITAL_COMMUNITY)
Admission: RE | Admit: 2018-02-05 | Discharge: 2018-02-05 | Disposition: A | Discharge status: Home / Self Care | Payer: BLUE CROSS/BLUE SHIELD | Source: Ambulatory Visit | Attending: Specialist | Admitting: Specialist

## 2018-02-05 ENCOUNTER — Other Ambulatory Visit: Payer: Self-pay

## 2018-02-05 ENCOUNTER — Encounter (HOSPITAL_COMMUNITY): Payer: Self-pay

## 2018-02-05 DIAGNOSIS — Z01812 Encounter for preprocedural laboratory examination: Secondary | ICD-10-CM | POA: Insufficient documentation

## 2018-02-05 HISTORY — DX: Anemia, unspecified: D64.9

## 2018-02-05 HISTORY — DX: Syncope and collapse: R55

## 2018-02-05 HISTORY — DX: Benign lipomatous neoplasm, unspecified: D17.9

## 2018-02-05 HISTORY — DX: Unspecified osteoarthritis, unspecified site: M19.90

## 2018-02-05 HISTORY — DX: Nontoxic single thyroid nodule: E04.1

## 2018-02-05 HISTORY — DX: Other specified postprocedural states: Z98.890

## 2018-02-05 HISTORY — DX: Personal history of other diseases of the digestive system: Z87.19

## 2018-02-05 HISTORY — DX: Other specified postprocedural states: R11.2

## 2018-02-05 HISTORY — DX: Personal history of colonic polyps: Z86.010

## 2018-02-05 HISTORY — DX: Hypothyroidism, unspecified: E03.9

## 2018-02-05 HISTORY — DX: Carpal tunnel syndrome, unspecified upper limb: G56.00

## 2018-02-05 HISTORY — DX: Unspecified fracture of left foot, initial encounter for closed fracture: S92.902A

## 2018-02-05 HISTORY — DX: Other specified conditions associated with female genital organs and menstrual cycle: N94.89

## 2018-02-05 HISTORY — DX: Unspecified hemorrhoids: K64.9

## 2018-02-05 HISTORY — DX: Personal history of other diseases of the female genital tract: Z87.42

## 2018-02-05 HISTORY — DX: Vitamin D deficiency, unspecified: E55.9

## 2018-02-05 LAB — CBC
HCT: 40.4 % (ref 36.0–46.0)
Hemoglobin: 12.9 g/dL (ref 12.0–15.0)
MCH: 30.8 pg (ref 26.0–34.0)
MCHC: 31.9 g/dL (ref 30.0–36.0)
MCV: 96.4 fL (ref 80.0–100.0)
Platelets: 229 10*3/uL (ref 150–400)
RBC: 4.19 MIL/uL (ref 3.87–5.11)
RDW: 12.7 % (ref 11.5–15.5)
WBC: 5.2 10*3/uL (ref 4.0–10.5)
nRBC: 0 % (ref 0.0–0.2)

## 2018-02-05 LAB — URINALYSIS, ROUTINE W REFLEX MICROSCOPIC
Bilirubin Urine: NEGATIVE
Glucose, UA: NEGATIVE mg/dL
Hgb urine dipstick: NEGATIVE
Ketones, ur: NEGATIVE mg/dL
Nitrite: NEGATIVE
Protein, ur: NEGATIVE mg/dL
Specific Gravity, Urine: 1.01 (ref 1.005–1.030)
pH: 7 (ref 5.0–8.0)

## 2018-02-05 LAB — APTT: aPTT: 32 seconds (ref 24–36)

## 2018-02-05 LAB — BASIC METABOLIC PANEL
Anion gap: 6 (ref 5–15)
BUN: 10 mg/dL (ref 8–23)
CO2: 29 mmol/L (ref 22–32)
Calcium: 9.1 mg/dL (ref 8.9–10.3)
Chloride: 108 mmol/L (ref 98–111)
Creatinine, Ser: 0.72 mg/dL (ref 0.44–1.00)
GFR calc Af Amer: >60 mL/min (ref >60)
GFR calc non Af Amer: >60 mL/min (ref >60)
Glucose, Bld: 91 mg/dL (ref 70–99)
Potassium: 4.2 mmol/L (ref 3.5–5.1)
Sodium: 143 mmol/L (ref 135–145)

## 2018-02-05 LAB — PROTIME-INR
INR: 0.89
Prothrombin Time: 11.9 seconds (ref 11.4–15.2)

## 2018-02-05 LAB — SURGICAL PCR SCREEN
MRSA, PCR: NEGATIVE
Staphylococcus aureus: NEGATIVE

## 2018-02-05 NOTE — Pre-Procedure Instructions (Signed)
UA results 02/05/2018 sent to Dr. Theda Sers via epic.

## 2018-02-06 LAB — ABO/RH: ABO/RH(D): A NEG

## 2018-02-12 ENCOUNTER — Inpatient Hospital Stay (HOSPITAL_COMMUNITY): Payer: BLUE CROSS/BLUE SHIELD | Admitting: Certified Registered Nurse Anesthetist

## 2018-02-12 ENCOUNTER — Inpatient Hospital Stay (HOSPITAL_COMMUNITY)
Admission: RE | Admit: 2018-02-12 | Discharge: 2018-02-14 | DRG: 470 | Disposition: A | Discharge status: Home / Self Care | Payer: BLUE CROSS/BLUE SHIELD | Attending: Specialist | Admitting: Specialist

## 2018-02-12 ENCOUNTER — Encounter (HOSPITAL_COMMUNITY)
Admission: RE | Disposition: A | Discharge status: Home / Self Care | Payer: Self-pay | Source: Home / Self Care | Attending: Specialist

## 2018-02-12 ENCOUNTER — Encounter (HOSPITAL_COMMUNITY): Payer: Self-pay | Admitting: *Deleted

## 2018-02-12 DIAGNOSIS — Z79899 Other long term (current) drug therapy: Secondary | ICD-10-CM

## 2018-02-12 DIAGNOSIS — Z79890 Hormone replacement therapy (postmenopausal): Secondary | ICD-10-CM | POA: Diagnosis not present

## 2018-02-12 DIAGNOSIS — Z8719 Personal history of other diseases of the digestive system: Secondary | ICD-10-CM | POA: Diagnosis not present

## 2018-02-12 DIAGNOSIS — Z90710 Acquired absence of both cervix and uterus: Secondary | ICD-10-CM

## 2018-02-12 DIAGNOSIS — M8580 Other specified disorders of bone density and structure, unspecified site: Secondary | ICD-10-CM | POA: Diagnosis present

## 2018-02-12 DIAGNOSIS — Z885 Allergy status to narcotic agent status: Secondary | ICD-10-CM

## 2018-02-12 DIAGNOSIS — Z8249 Family history of ischemic heart disease and other diseases of the circulatory system: Secondary | ICD-10-CM

## 2018-02-12 DIAGNOSIS — Z886 Allergy status to analgesic agent status: Secondary | ICD-10-CM

## 2018-02-12 DIAGNOSIS — Z87891 Personal history of nicotine dependence: Secondary | ICD-10-CM

## 2018-02-12 DIAGNOSIS — D6851 Activated protein C resistance: Secondary | ICD-10-CM | POA: Diagnosis present

## 2018-02-12 DIAGNOSIS — E039 Hypothyroidism, unspecified: Secondary | ICD-10-CM | POA: Diagnosis present

## 2018-02-12 DIAGNOSIS — Z7982 Long term (current) use of aspirin: Secondary | ICD-10-CM | POA: Diagnosis not present

## 2018-02-12 DIAGNOSIS — Z6834 Body mass index (BMI) 34.0-34.9, adult: Secondary | ICD-10-CM | POA: Diagnosis not present

## 2018-02-12 DIAGNOSIS — Z881 Allergy status to other antibiotic agents status: Secondary | ICD-10-CM

## 2018-02-12 DIAGNOSIS — Z888 Allergy status to other drugs, medicaments and biological substances status: Secondary | ICD-10-CM | POA: Diagnosis not present

## 2018-02-12 DIAGNOSIS — M1711 Unilateral primary osteoarthritis, right knee: Principal | ICD-10-CM

## 2018-02-12 DIAGNOSIS — Z96659 Presence of unspecified artificial knee joint: Secondary | ICD-10-CM

## 2018-02-12 DIAGNOSIS — R42 Dizziness and giddiness: Secondary | ICD-10-CM | POA: Diagnosis not present

## 2018-02-12 DIAGNOSIS — E669 Obesity, unspecified: Secondary | ICD-10-CM | POA: Diagnosis present

## 2018-02-12 DIAGNOSIS — F329 Major depressive disorder, single episode, unspecified: Secondary | ICD-10-CM | POA: Diagnosis present

## 2018-02-12 DIAGNOSIS — F419 Anxiety disorder, unspecified: Secondary | ICD-10-CM | POA: Diagnosis present

## 2018-02-12 DIAGNOSIS — K219 Gastro-esophageal reflux disease without esophagitis: Secondary | ICD-10-CM | POA: Diagnosis present

## 2018-02-12 DIAGNOSIS — Z7951 Long term (current) use of inhaled steroids: Secondary | ICD-10-CM | POA: Diagnosis not present

## 2018-02-12 DIAGNOSIS — M25761 Osteophyte, right knee: Secondary | ICD-10-CM | POA: Diagnosis present

## 2018-02-12 HISTORY — PX: TOTAL KNEE ARTHROPLASTY: SHX125

## 2018-02-12 LAB — TYPE AND SCREEN
ABO/RH(D): A NEG
Antibody Screen: NEGATIVE

## 2018-02-12 SURGERY — ARTHROPLASTY, KNEE, TOTAL
Anesthesia: Spinal | Site: Knee | Laterality: Right

## 2018-02-12 MED ORDER — ALPRAZOLAM 0.25 MG PO TABS: 0.2500 mg | ORAL_TABLET | Freq: Every evening | ORAL | Status: DC | PRN

## 2018-02-12 MED ORDER — MAGNESIUM CITRATE PO SOLN: 1.0000 | Freq: Once | ORAL | Status: DC | PRN

## 2018-02-12 MED ORDER — DEXAMETHASONE SODIUM PHOSPHATE 10 MG/ML IJ SOLN
10.0000 mg | Freq: Once | INTRAMUSCULAR | Status: AC
  Administered 2018-02-12: 10 mg via INTRAVENOUS

## 2018-02-12 MED ORDER — PROPOFOL 10 MG/ML IV BOLUS
INTRAVENOUS | Status: DC | PRN
  Administered 2018-02-12: 30 mg via INTRAVENOUS

## 2018-02-12 MED ORDER — METHOCARBAMOL 500 MG IVPB - SIMPLE MED
INTRAVENOUS | Status: AC
  Filled 2018-02-12: qty 50

## 2018-02-12 MED ORDER — FLUTICASONE PROPIONATE 50 MCG/ACT NA SUSP
1.0000 | Freq: Every day | NASAL | Status: DC | PRN
  Filled 2018-02-12: qty 16

## 2018-02-12 MED ORDER — HYDROMORPHONE HCL 1 MG/ML IJ SOLN
INTRAMUSCULAR | Status: AC
  Administered 2018-02-12: 0.5 mg via INTRAVENOUS
  Filled 2018-02-12: qty 1

## 2018-02-12 MED ORDER — MIDAZOLAM HCL 2 MG/2ML IJ SOLN
1.0000 mg | Freq: Once | INTRAMUSCULAR | Status: AC
  Administered 2018-02-12: 1 mg via INTRAVENOUS
  Filled 2018-02-12: qty 2

## 2018-02-12 MED ORDER — ONDANSETRON HCL 4 MG/2ML IJ SOLN
INTRAMUSCULAR | Status: DC | PRN
  Administered 2018-02-12: 4 mg via INTRAVENOUS

## 2018-02-12 MED ORDER — OXYCODONE HCL 5 MG PO TABS
ORAL_TABLET | ORAL | Status: AC
  Filled 2018-02-12: qty 1

## 2018-02-12 MED ORDER — SODIUM CHLORIDE 0.9 % IJ SOLN
INTRAMUSCULAR | Status: AC
  Filled 2018-02-12: qty 50

## 2018-02-12 MED ORDER — ENOXAPARIN SODIUM 30 MG/0.3ML ~~LOC~~ SOLN
30.0000 mg | Freq: Two times a day (BID) | SUBCUTANEOUS | Status: DC
  Administered 2018-02-13 – 2018-02-14 (×3): 30 mg via SUBCUTANEOUS
  Filled 2018-02-12 (×3): qty 0.3

## 2018-02-12 MED ORDER — DOCUSATE SODIUM 100 MG PO CAPS
100.0000 mg | ORAL_CAPSULE | Freq: Two times a day (BID) | ORAL | Status: DC
  Administered 2018-02-12 – 2018-02-14 (×4): 100 mg via ORAL
  Filled 2018-02-12 (×4): qty 1

## 2018-02-12 MED ORDER — FERROUS SULFATE 325 (65 FE) MG PO TABS
325.0000 mg | ORAL_TABLET | Freq: Three times a day (TID) | ORAL | Status: DC
  Administered 2018-02-13 – 2018-02-14 (×3): 325 mg via ORAL
  Filled 2018-02-12 (×3): qty 1

## 2018-02-12 MED ORDER — ROPIVACAINE HCL 7.5 MG/ML IJ SOLN
INTRAMUSCULAR | Status: DC | PRN
  Administered 2018-02-12: 20 mL via PERINEURAL

## 2018-02-12 MED ORDER — KETOROLAC TROMETHAMINE 30 MG/ML IJ SOLN
INTRAMUSCULAR | Status: AC
  Filled 2018-02-12: qty 1

## 2018-02-12 MED ORDER — MIDAZOLAM HCL 2 MG/2ML IJ SOLN
INTRAMUSCULAR | Status: AC
  Filled 2018-02-12: qty 2

## 2018-02-12 MED ORDER — ACETAMINOPHEN 325 MG PO TABS
ORAL_TABLET | ORAL | Status: AC
  Filled 2018-02-12: qty 1

## 2018-02-12 MED ORDER — MIDAZOLAM HCL 2 MG/2ML IJ SOLN: 0.5000 mg | Freq: Once | INTRAMUSCULAR | Status: DC | PRN

## 2018-02-12 MED ORDER — HYDROMORPHONE HCL 1 MG/ML IJ SOLN
0.5000 mg | INTRAMUSCULAR | Status: DC | PRN
  Administered 2018-02-12 – 2018-02-13 (×2): 1 mg via INTRAVENOUS
  Filled 2018-02-12 (×2): qty 1

## 2018-02-12 MED ORDER — FENTANYL CITRATE (PF) 100 MCG/2ML IJ SOLN
50.0000 ug | Freq: Once | INTRAMUSCULAR | Status: AC
  Administered 2018-02-12: 50 ug via INTRAVENOUS
  Filled 2018-02-12: qty 2

## 2018-02-12 MED ORDER — OXYCODONE HCL 5 MG PO TABS: 5.0000 mg | ORAL_TABLET | ORAL | 0 refills | Status: AC | PRN

## 2018-02-12 MED ORDER — OXYCODONE HCL 5 MG PO TABS
5.0000 mg | ORAL_TABLET | ORAL | Status: DC | PRN
  Administered 2018-02-12: 5 mg via ORAL
  Administered 2018-02-13: 10 mg via ORAL
  Filled 2018-02-12 (×4): qty 2

## 2018-02-12 MED ORDER — DIPHENHYDRAMINE HCL 12.5 MG/5ML PO ELIX: 12.5000 mg | ORAL_SOLUTION | ORAL | Status: DC | PRN

## 2018-02-12 MED ORDER — BUPIVACAINE HCL 0.25 % IJ SOLN
INTRAMUSCULAR | Status: AC
  Filled 2018-02-12: qty 1

## 2018-02-12 MED ORDER — MEPERIDINE HCL 50 MG/ML IJ SOLN: 6.2500 mg | INTRAMUSCULAR | Status: DC | PRN

## 2018-02-12 MED ORDER — ACETAMINOPHEN 500 MG PO TABS
1000.0000 mg | ORAL_TABLET | Freq: Four times a day (QID) | ORAL | Status: AC
  Administered 2018-02-12 – 2018-02-13 (×4): 1000 mg via ORAL
  Filled 2018-02-12 (×3): qty 2

## 2018-02-12 MED ORDER — PROPOFOL 10 MG/ML IV BOLUS
INTRAVENOUS | Status: AC
  Filled 2018-02-12: qty 40

## 2018-02-12 MED ORDER — ONDANSETRON HCL 4 MG PO TABS: 4.0000 mg | ORAL_TABLET | Freq: Four times a day (QID) | ORAL | Status: DC | PRN

## 2018-02-12 MED ORDER — ACETAMINOPHEN 325 MG PO TABS
325.0000 mg | ORAL_TABLET | Freq: Four times a day (QID) | ORAL | Status: DC | PRN
  Administered 2018-02-13 – 2018-02-14 (×2): 650 mg via ORAL
  Filled 2018-02-12 (×2): qty 2

## 2018-02-12 MED ORDER — METHOCARBAMOL 500 MG IVPB - SIMPLE MED
500.0000 mg | Freq: Four times a day (QID) | INTRAVENOUS | Status: DC | PRN
  Administered 2018-02-12: 500 mg via INTRAVENOUS
  Filled 2018-02-12: qty 50

## 2018-02-12 MED ORDER — PANTOPRAZOLE SODIUM 40 MG PO TBEC
40.0000 mg | DELAYED_RELEASE_TABLET | Freq: Every day | ORAL | Status: DC
  Administered 2018-02-12 – 2018-02-14 (×3): 40 mg via ORAL
  Filled 2018-02-12 (×3): qty 1

## 2018-02-12 MED ORDER — METHOCARBAMOL 500 MG PO TABS
500.0000 mg | ORAL_TABLET | Freq: Four times a day (QID) | ORAL | Status: DC | PRN
  Administered 2018-02-12 – 2018-02-14 (×4): 500 mg via ORAL
  Filled 2018-02-12 (×4): qty 1

## 2018-02-12 MED ORDER — CEFAZOLIN SODIUM-DEXTROSE 2-4 GM/100ML-% IV SOLN
2.0000 g | Freq: Four times a day (QID) | INTRAVENOUS | Status: AC
  Administered 2018-02-12 – 2018-02-13 (×2): 2 g via INTRAVENOUS
  Filled 2018-02-12 (×2): qty 100

## 2018-02-12 MED ORDER — DEXAMETHASONE SODIUM PHOSPHATE 10 MG/ML IJ SOLN
10.0000 mg | Freq: Once | INTRAMUSCULAR | Status: AC
  Administered 2018-02-13: 10 mg via INTRAVENOUS
  Filled 2018-02-12: qty 1

## 2018-02-12 MED ORDER — PHENYLEPHRINE 40 MCG/ML (10ML) SYRINGE FOR IV PUSH (FOR BLOOD PRESSURE SUPPORT)
PREFILLED_SYRINGE | INTRAVENOUS | Status: AC
  Filled 2018-02-12: qty 10

## 2018-02-12 MED ORDER — KETOROLAC TROMETHAMINE 30 MG/ML IJ SOLN
INTRAMUSCULAR | Status: DC | PRN
  Administered 2018-02-12: 30 mg via INTRAVENOUS

## 2018-02-12 MED ORDER — LIDOCAINE 2% (20 MG/ML) 5 ML SYRINGE
INTRAMUSCULAR | Status: AC
  Filled 2018-02-12: qty 5

## 2018-02-12 MED ORDER — ACETAMINOPHEN 500 MG PO TABS
ORAL_TABLET | ORAL | Status: AC
  Filled 2018-02-12: qty 2

## 2018-02-12 MED ORDER — SODIUM CHLORIDE 0.9 % IV SOLN
INTRAVENOUS | Status: DC | PRN
  Administered 2018-02-12: 30 ug/min via INTRAVENOUS

## 2018-02-12 MED ORDER — METOCLOPRAMIDE HCL 5 MG/ML IJ SOLN: 5.0000 mg | Freq: Three times a day (TID) | INTRAMUSCULAR | Status: DC | PRN

## 2018-02-12 MED ORDER — ONDANSETRON HCL 4 MG/2ML IJ SOLN
INTRAMUSCULAR | Status: AC
  Filled 2018-02-12: qty 2

## 2018-02-12 MED ORDER — PROPOFOL 10 MG/ML IV BOLUS
INTRAVENOUS | Status: AC
  Filled 2018-02-12: qty 60

## 2018-02-12 MED ORDER — LEVOTHYROXINE SODIUM 88 MCG PO TABS
88.0000 ug | ORAL_TABLET | Freq: Every day | ORAL | Status: DC
  Administered 2018-02-13 – 2018-02-14 (×2): 88 ug via ORAL
  Filled 2018-02-12 (×2): qty 1

## 2018-02-12 MED ORDER — MENTHOL 3 MG MT LOZG: 1.0000 | LOZENGE | OROMUCOSAL | Status: DC | PRN

## 2018-02-12 MED ORDER — CEFAZOLIN SODIUM-DEXTROSE 2-4 GM/100ML-% IV SOLN
2.0000 g | INTRAVENOUS | Status: AC
  Administered 2018-02-12: 2 g via INTRAVENOUS
  Filled 2018-02-12: qty 100

## 2018-02-12 MED ORDER — LIDOCAINE 2% (20 MG/ML) 5 ML SYRINGE
INTRAMUSCULAR | Status: DC | PRN
  Administered 2018-02-12: 60 mg via INTRAVENOUS

## 2018-02-12 MED ORDER — ASPIRIN EC 325 MG PO TBEC: 325.0000 mg | DELAYED_RELEASE_TABLET | Freq: Two times a day (BID) | ORAL | 0 refills | Status: AC

## 2018-02-12 MED ORDER — PHENYLEPHRINE 40 MCG/ML (10ML) SYRINGE FOR IV PUSH (FOR BLOOD PRESSURE SUPPORT)
PREFILLED_SYRINGE | INTRAVENOUS | Status: DC | PRN
  Administered 2018-02-12 (×2): 80 ug via INTRAVENOUS

## 2018-02-12 MED ORDER — LACTATED RINGERS IV SOLN
INTRAVENOUS | Status: DC
  Administered 2018-02-12 (×2): via INTRAVENOUS

## 2018-02-12 MED ORDER — BISACODYL 5 MG PO TBEC: 5.0000 mg | DELAYED_RELEASE_TABLET | Freq: Every day | ORAL | Status: DC | PRN

## 2018-02-12 MED ORDER — METHOCARBAMOL 500 MG PO TABS: 500.0000 mg | ORAL_TABLET | Freq: Three times a day (TID) | ORAL | 1 refills | Status: DC | PRN

## 2018-02-12 MED ORDER — HYDROMORPHONE HCL 1 MG/ML IJ SOLN
0.2500 mg | INTRAMUSCULAR | Status: DC | PRN
  Administered 2018-02-12 (×4): 0.5 mg via INTRAVENOUS

## 2018-02-12 MED ORDER — DEXAMETHASONE SODIUM PHOSPHATE 10 MG/ML IJ SOLN
INTRAMUSCULAR | Status: AC
  Filled 2018-02-12: qty 1

## 2018-02-12 MED ORDER — METOCLOPRAMIDE HCL 5 MG PO TABS: 5.0000 mg | ORAL_TABLET | Freq: Three times a day (TID) | ORAL | Status: DC | PRN

## 2018-02-12 MED ORDER — PROPOFOL 500 MG/50ML IV EMUL
INTRAVENOUS | Status: DC | PRN
  Administered 2018-02-12: 100 ug/kg/min via INTRAVENOUS

## 2018-02-12 MED ORDER — CHLORHEXIDINE GLUCONATE 4 % EX LIQD: 60.0000 mL | Freq: Once | CUTANEOUS | Status: DC

## 2018-02-12 MED ORDER — HYDROMORPHONE HCL 1 MG/ML IJ SOLN
INTRAMUSCULAR | Status: AC
  Filled 2018-02-12: qty 1

## 2018-02-12 MED ORDER — VALACYCLOVIR HCL 500 MG PO TABS
1000.0000 mg | ORAL_TABLET | Freq: Two times a day (BID) | ORAL | Status: DC | PRN
  Filled 2018-02-12: qty 2

## 2018-02-12 MED ORDER — ONDANSETRON HCL 4 MG/2ML IJ SOLN: 4.0000 mg | Freq: Four times a day (QID) | INTRAMUSCULAR | Status: DC | PRN

## 2018-02-12 MED ORDER — PROMETHAZINE HCL 25 MG/ML IJ SOLN: 6.2500 mg | INTRAMUSCULAR | Status: DC | PRN

## 2018-02-12 MED ORDER — PHENOL 1.4 % MT LIQD
1.0000 | OROMUCOSAL | Status: DC | PRN
  Filled 2018-02-12: qty 177

## 2018-02-12 MED ORDER — ALUM & MAG HYDROXIDE-SIMETH 200-200-20 MG/5ML PO SUSP
30.0000 mL | ORAL | Status: DC | PRN
  Administered 2018-02-13 – 2018-02-14 (×2): 30 mL via ORAL
  Filled 2018-02-12 (×2): qty 30

## 2018-02-12 MED ORDER — OXYCODONE HCL 5 MG PO TABS
10.0000 mg | ORAL_TABLET | ORAL | Status: DC | PRN
  Administered 2018-02-12: 10 mg via ORAL
  Administered 2018-02-13: 15 mg via ORAL
  Administered 2018-02-13 – 2018-02-14 (×5): 10 mg via ORAL
  Filled 2018-02-12: qty 2
  Filled 2018-02-12: qty 3
  Filled 2018-02-12 (×2): qty 2

## 2018-02-12 MED ORDER — POLYETHYLENE GLYCOL 3350 17 G PO PACK: 17.0000 g | PACK | Freq: Every day | ORAL | Status: DC | PRN

## 2018-02-12 MED ORDER — SODIUM CHLORIDE 0.9 % IV SOLN
INTRAVENOUS | Status: DC
  Administered 2018-02-12 – 2018-02-13 (×2): via INTRAVENOUS

## 2018-02-12 MED ORDER — BUPIVACAINE HCL (PF) 0.75 % IJ SOLN
INTRAMUSCULAR | Status: DC | PRN
  Administered 2018-02-12: 1.6 mL via INTRATHECAL

## 2018-02-12 MED ORDER — BUPIVACAINE HCL (PF) 0.25 % IJ SOLN
INTRAMUSCULAR | Status: DC | PRN
  Administered 2018-02-12: 20 mL

## 2018-02-12 MED ORDER — TRANEXAMIC ACID-NACL 1000-0.7 MG/100ML-% IV SOLN
1000.0000 mg | INTRAVENOUS | Status: AC
  Administered 2018-02-12: 1000 mg via INTRAVENOUS
  Filled 2018-02-12: qty 100

## 2018-02-12 MED ORDER — SODIUM CHLORIDE 0.9 % IR SOLN
Status: DC | PRN
  Administered 2018-02-12: 1000 mL

## 2018-02-12 MED ORDER — MIDAZOLAM HCL 2 MG/2ML IJ SOLN
INTRAMUSCULAR | Status: DC | PRN
  Administered 2018-02-12: 2 mg via INTRAVENOUS

## 2018-02-12 MED ORDER — SODIUM CHLORIDE 0.9 % IJ SOLN
INTRAMUSCULAR | Status: DC | PRN
  Administered 2018-02-12: 50 mL via INTRAVENOUS

## 2018-02-12 SURGICAL SUPPLY — 65 items
ADH SKN CLS APL DERMABOND .7 (GAUZE/BANDAGES/DRESSINGS) ×1
BAG DECANTER FOR FLEXI CONT (MISCELLANEOUS) IMPLANT
BAG SPEC THK2 15X12 ZIP CLS (MISCELLANEOUS) ×2
BAG ZIPLOCK 12X15 (MISCELLANEOUS) ×4 IMPLANT
BANDAGE ACE 4X5 VEL STRL LF (GAUZE/BANDAGES/DRESSINGS) ×2 IMPLANT
BANDAGE ACE 6X5 VEL STRL LF (GAUZE/BANDAGES/DRESSINGS) ×2 IMPLANT
BANDAGE ELASTIC 4 VELCRO ST LF (GAUZE/BANDAGES/DRESSINGS) ×1 IMPLANT
BANDAGE ELASTIC 6 VELCRO ST LF (GAUZE/BANDAGES/DRESSINGS) ×1 IMPLANT
BLADE SAG 18X100X1.27 (BLADE) ×2 IMPLANT
BLADE SAW SGTL 13.0X1.19X90.0M (BLADE) ×2 IMPLANT
BOWL SMART MIX CTS (DISPOSABLE) ×2 IMPLANT
CEMENT HV SMART SET (Cement) ×2 IMPLANT
CEMENT TIBIA MBT (Knees) IMPLANT
COVER SURGICAL LIGHT HANDLE (MISCELLANEOUS) ×2 IMPLANT
COVER WAND RF STERILE (DRAPES) IMPLANT
CUFF TOURN SGL QUICK 34 (TOURNIQUET CUFF) ×2
CUFF TRNQT CYL 34X4X40X1 (TOURNIQUET CUFF) ×1 IMPLANT
DECANTER SPIKE VIAL GLASS SM (MISCELLANEOUS) ×2 IMPLANT
DERMABOND ADVANCED (GAUZE/BANDAGES/DRESSINGS) ×1
DERMABOND ADVANCED .7 DNX12 (GAUZE/BANDAGES/DRESSINGS) ×1 IMPLANT
DRAPE U-SHAPE 47X51 STRL (DRAPES) ×2 IMPLANT
DRSG AQUACEL AG ADV 3.5X 6 (GAUZE/BANDAGES/DRESSINGS) ×1 IMPLANT
DRSG AQUACEL AG ADV 3.5X10 (GAUZE/BANDAGES/DRESSINGS) ×2 IMPLANT
DRSG TEGADERM 4X4.75 (GAUZE/BANDAGES/DRESSINGS) ×2 IMPLANT
DURAPREP 26ML APPLICATOR (WOUND CARE) ×4 IMPLANT
ELECT REM PT RETURN 15FT ADLT (MISCELLANEOUS) ×2 IMPLANT
EVACUATOR 1/8 PVC DRAIN (DRAIN) ×2 IMPLANT
FEMUR SIGMA PS SZ 4.0N R (Femur) ×1 IMPLANT
GAUZE SPONGE 2X2 8PLY STRL LF (GAUZE/BANDAGES/DRESSINGS) ×1 IMPLANT
GLOVE BIO SURGEON STRL SZ7.5 (GLOVE) ×4 IMPLANT
GLOVE BIOGEL PI IND STRL 8 (GLOVE) ×2 IMPLANT
GLOVE BIOGEL PI INDICATOR 8 (GLOVE) ×2
GLOVE ECLIPSE 8.0 STRL XLNG CF (GLOVE) ×4 IMPLANT
GLOVE SURG ORTHO 9.0 STRL STRW (GLOVE) ×2 IMPLANT
GOWN STRL REUS W/TWL XL LVL3 (GOWN DISPOSABLE) ×4 IMPLANT
HANDPIECE INTERPULSE COAX TIP (DISPOSABLE) ×2
HOLDER FOLEY CATH W/STRAP (MISCELLANEOUS) IMPLANT
IMMOBILIZER KNEE 20 (SOFTGOODS) ×3 IMPLANT
IMMOBILIZER KNEE 20 THIGH 36 (SOFTGOODS) ×1 IMPLANT
NS IRRIG 1000ML POUR BTL (IV SOLUTION) ×2 IMPLANT
PACK TOTAL KNEE CUSTOM (KITS) ×2 IMPLANT
PATELLA DOME PFC 32MM (Knees) ×1 IMPLANT
PLATE ROT INSERT 10MM SIZE 4 (Plate) ×1 IMPLANT
POSITIONER SURGICAL ARM (MISCELLANEOUS) ×2 IMPLANT
SET HNDPC FAN SPRY TIP SCT (DISPOSABLE) ×1 IMPLANT
SET PAD KNEE POSITIONER (MISCELLANEOUS) ×2 IMPLANT
SPONGE GAUZE 2X2 STER 10/PKG (GAUZE/BANDAGES/DRESSINGS) ×1
SPONGE LAP 18X18 RF (DISPOSABLE) IMPLANT
SPONGE SURGIFOAM ABS GEL 100 (HEMOSTASIS) ×2 IMPLANT
STOCKINETTE 6  STRL (DRAPES) ×1
STOCKINETTE 6 STRL (DRAPES) ×1 IMPLANT
SUT BONE WAX W31G (SUTURE) IMPLANT
SUT MNCRL AB 3-0 PS2 18 (SUTURE) ×2 IMPLANT
SUT VIC AB 1 CT1 27 (SUTURE) ×8
SUT VIC AB 1 CT1 27XBRD ANTBC (SUTURE) ×4 IMPLANT
SUT VIC AB 2-0 CT1 27 (SUTURE) ×4
SUT VIC AB 2-0 CT1 TAPERPNT 27 (SUTURE) ×2 IMPLANT
SUT VLOC 180 0 24IN GS25 (SUTURE) ×2 IMPLANT
SYR 50ML LL SCALE MARK (SYRINGE) ×2 IMPLANT
TAPE STRIPS DRAPE STRL (GAUZE/BANDAGES/DRESSINGS) ×2 IMPLANT
TIBIA MBT CEMENT (Knees) ×2 IMPLANT
TRAY FOLEY MTR SLVR 16FR STAT (SET/KITS/TRAYS/PACK) ×2 IMPLANT
WATER STERILE IRR 1000ML POUR (IV SOLUTION) ×4 IMPLANT
WRAP KNEE MAXI GEL POST OP (GAUZE/BANDAGES/DRESSINGS) ×2 IMPLANT
YANKAUER SUCT BULB TIP 10FT TU (MISCELLANEOUS) ×2 IMPLANT

## 2018-02-12 NOTE — Anesthesia Postprocedure Evaluation (Signed)
Anesthesia Post Note  Patient: Sandra Roy  Procedure(s) Performed: RIGHT TOTAL KNEE ARTHROPLASTY (Right Knee)     Patient location during evaluation: PACU Anesthesia Type: Spinal Level of consciousness: awake and alert, oriented and patient cooperative Pain management: pain level controlled Vital Signs Assessment: post-procedure vital signs reviewed and stable Respiratory status: spontaneous breathing, nonlabored ventilation and respiratory function stable Cardiovascular status: blood pressure returned to baseline and stable Postop Assessment: spinal receding and no apparent nausea or vomiting Anesthetic complications: no    Last Vitals:  Vitals:   02/12/18 1600 02/12/18 1630  BP: 138/86   Pulse: 74   Resp: 15   Temp:  36.7 C  SpO2: 100%     Last Pain:  Vitals:   02/12/18 1630  TempSrc:   PainSc: 5                  Arieh Bogue,E. Ikenna Ohms

## 2018-02-12 NOTE — Anesthesia Preprocedure Evaluation (Addendum)
Anesthesia Evaluation  Patient identified by MRN, date of birth, ID band Patient awake    Reviewed: Allergy & Precautions, NPO status , Patient's Chart, lab work & pertinent test results  History of Anesthesia Complications (+) PONV  Airway Mallampati: II  TM Distance: >3 FB Neck ROM: Full    Dental  (+) Dental Advisory Given, Missing   Pulmonary former smoker (quit 1983),    breath sounds clear to auscultation       Cardiovascular negative cardio ROS   Rhythm:Regular Rate:Normal     Neuro/Psych Anxiety Depression negative neurological ROS     GI/Hepatic Neg liver ROS, GERD  Controlled and Medicated,  Endo/Other  Hypothyroidism obese  Renal/GU negative Renal ROS     Musculoskeletal  (+) Arthritis , Osteoarthritis,    Abdominal (+) + obese,   Peds  Hematology  (+) Blood dyscrasia (factor V Leiden), ,   Anesthesia Other Findings   Reproductive/Obstetrics                            Anesthesia Physical Anesthesia Plan  ASA: III  Anesthesia Plan: Spinal   Post-op Pain Management:  Regional for Post-op pain   Induction:   PONV Risk Score and Plan: 3 and Ondansetron and Dexamethasone  Airway Management Planned: Natural Airway and Nasal Cannula  Additional Equipment:   Intra-op Plan:   Post-operative Plan:   Informed Consent: I have reviewed the patients History and Physical, chart, labs and discussed the procedure including the risks, benefits and alternatives for the proposed anesthesia with the patient or authorized representative who has indicated his/her understanding and acceptance.   Dental advisory given  Plan Discussed with: CRNA and Surgeon  Anesthesia Plan Comments: (Plan routine monitors, SAB with adductor canal block for post op analgesia)        Anesthesia Quick Evaluation

## 2018-02-12 NOTE — Transfer of Care (Signed)
Immediate Anesthesia Transfer of Care Note  Patient: Sandra Roy  Procedure(s) Performed: RIGHT TOTAL KNEE ARTHROPLASTY (Right Knee)  Patient Location: PACU  Anesthesia Type:Spinal  Level of Consciousness: awake, alert , oriented and patient cooperative  Airway & Oxygen Therapy: Patient Spontanous Breathing and Patient connected to nasal cannula oxygen  Post-op Assessment: Report given to RN and Post -op Vital signs reviewed and stable  Post vital signs: Reviewed and stable  Last Vitals:  Vitals Value Taken Time  BP    Temp    Pulse    Resp    SpO2      Last Pain:  Vitals:   02/12/18 0800  TempSrc: Oral         Complications: No apparent anesthesia complications

## 2018-02-12 NOTE — Anesthesia Procedure Notes (Signed)
Date/Time: 02/12/2018 10:53 AM Performed by: Claudia Desanctis, CRNA Oxygen Delivery Method: Simple face mask

## 2018-02-12 NOTE — Progress Notes (Signed)
AssistedDr. Carswell Jackson with right, ultrasound guided, adductor canal block. Side rails up, monitors on throughout procedure. See vital signs in flow sheet. Tolerated Procedure well.  

## 2018-02-12 NOTE — Interval H&P Note (Signed)
History and Physical Interval Note:  02/12/2018 7:39 AM  Sandra Roy  has presented today for surgery, with the diagnosis of Right knee osteoarthritis  The various methods of treatment have been discussed with the patient and family. After consideration of risks, benefits and other options for treatment, the patient has consented to  Procedure(s): RIGHT TOTAL KNEE ARTHROPLASTY (Right) as a surgical intervention .  The patient's history has been reviewed, patient examined, no change in status, stable for surgery.  I have reviewed the patient's chart and labs.  Questions were answered to the patient's satisfaction.     Eyad Rochford ANDREW

## 2018-02-12 NOTE — Evaluation (Signed)
Physical Therapy Evaluation Patient Details Name: Sandra Roy MRN: [id number] DOB: June 15, 1955 Today's Date: 02/12/2018   History of Present Illness  s/p R TKA   Clinical Impression  Pt is s/p TKA resulting in the deficits listed below (see PT Problem List). Pt with mild dizziness upon sitting which quickly resolved, pt able to amb ~ 29' with RW and min assist; anticipate steady progress with PT;   Pt will benefit from skilled PT to increase their independence and safety with mobility to allow discharge to the venue listed below.      Follow Up Recommendations Follow surgeon's recommendation for DC plan and follow-up therapies    Equipment Recommendations  None recommended by PT    Recommendations for Other Services       Precautions / Restrictions Precautions Precautions: Fall;Knee Precaution Comments: IND SLRs today, discussed use of KI Required Braces or Orthoses: Knee Immobilizer - Right Restrictions Weight Bearing Restrictions: No Other Position/Activity Restrictions: WBAT      Mobility  Bed Mobility Overal bed mobility: Needs Assistance Bed Mobility: Supine to Sit     Supine to sit: Min assist     General bed mobility comments: assist with RLE  Transfers Overall transfer level: Needs assistance Equipment used: Rolling walker (2 wheeled) Transfers: Sit to/from Stand Sit to Stand: Min assist         General transfer comment: light assist to rise and stabilize  Ambulation/Gait Ambulation/Gait assistance: Min assist;Min guard Gait Distance (Feet): 60 Feet Assistive device: Rolling walker (2 wheeled) Gait Pattern/deviations: Step-to pattern;Decreased weight shift to right     General Gait Details: cues for sequence and RW position/distance from self  Stairs            Wheelchair Mobility    Modified Rankin (Stroke Patients Only)       Balance                                             Pertinent Vitals/Pain  Pain Assessment: 0-10 Pain Location: right knee Pain Descriptors / Indicators: Discomfort;Grimacing;Guarding Pain Intervention(s): Limited activity within patient's tolerance;Monitored during session;Premedicated before session;Repositioned    Home Living Family/patient expects to be discharged to:: Private residence Living Arrangements: Spouse/significant other   Type of Home: House Home Access: Stairs to enter   Technical brewer of Steps: 4 or none Home Layout: One level Home Equipment: Environmental consultant - 2 wheels;Bedside commode;Shower seat;Cane - single point      Prior Function Level of Independence: Independent               Hand Dominance        Extremity/Trunk Assessment   Upper Extremity Assessment Upper Extremity Assessment: Overall WFL for tasks assessed    Lower Extremity Assessment Lower Extremity Assessment: RLE deficits/detail RLE Deficits / Details: ankle WFL; knee and hip 2+/5 limited by post op pain and weakness       Communication   Communication: No difficulties  Cognition Arousal/Alertness: Awake/alert   Overall Cognitive Status: Within Functional Limits for tasks assessed                                        General Comments      Exercises Total Joint Exercises Ankle Circles/Pumps: AROM;Both;10 reps Quad Sets: Both;AROM;10 reps  Assessment/Plan    PT Assessment Patient needs continued PT services  PT Problem List Decreased strength;Decreased range of motion;Decreased mobility;Decreased activity tolerance;Decreased knowledge of use of DME;Pain       PT Treatment Interventions DME instruction;Gait training;Therapeutic exercise;Therapeutic activities;Patient/family education;Functional mobility training;Stair training    PT Goals (Current goals can be found in the Care Plan section)  Acute Rehab PT Goals Time For Goal Achievement: 02/19/18 Potential to Achieve Goals: Good    Frequency 7X/week   Barriers to  discharge        Co-evaluation               AM-PAC PT "6 Clicks" Daily Activity  Outcome Measure Difficulty turning over in bed (including adjusting bedclothes, sheets and blankets)?: Unable Difficulty moving from lying on back to sitting on the side of the bed? : Unable Difficulty sitting down on and standing up from a chair with arms (e.g., wheelchair, bedside commode, etc,.)?: Unable Help needed moving to and from a bed to chair (including a wheelchair)?: A Little Help needed walking in hospital room?: A Little Help needed climbing 3-5 steps with a railing? : A Lot 6 Click Score: 11    End of Session Equipment Utilized During Treatment: Gait belt Activity Tolerance: Patient tolerated treatment well Patient left: in chair;with call bell/phone within reach;with family/visitor present   PT Visit Diagnosis: Difficulty in walking, not elsewhere classified (R26.2)    Time: 1930-2002 PT Time Calculation (min) (ACUTE ONLY): 32 min   Charges:   PT Evaluation $PT Eval Low Complexity: 1 Low PT Treatments $Gait Training: 8-22 mins        Kenyon Ana, PT  Pager: [phone] Acute Rehab Dept [facility]): [phone]   02/12/2018   [facility] 02/12/2018, 8:12 PM

## 2018-02-12 NOTE — Anesthesia Procedure Notes (Signed)
Spinal  Patient location during procedure: OR Start time: 02/12/2018 10:35 AM End time: 02/12/2018 10:40 AM Staffing Anesthesiologist: Annye Asa, MD Resident/CRNA: Claudia Desanctis, CRNA Performed: resident/CRNA  Preanesthetic Checklist Completed: patient identified, site marked, surgical consent, pre-op evaluation, timeout performed, IV checked, risks and benefits discussed and monitors and equipment checked Spinal Block Patient position: sitting Prep: DuraPrep Patient monitoring: heart rate, continuous pulse ox and blood pressure Approach: midline Location: L3-4 Injection technique: single-shot Needle Needle type: Pencan  Needle gauge: 24 G Needle length: 10 cm Needle insertion depth: 8 cm Assessment Sensory level: T6

## 2018-02-12 NOTE — Interval H&P Note (Signed)
History and Physical Interval Note:  02/12/2018 10:01 AM  Sandra Roy  has presented today for surgery, with the diagnosis of Right knee osteoarthritis  The various methods of treatment have been discussed with the patient and family. After consideration of risks, benefits and other options for treatment, the patient has consented to  Procedure(s): RIGHT TOTAL KNEE ARTHROPLASTY (Right) as a surgical intervention .  The patient's history has been reviewed, patient examined, no change in status, stable for surgery.  I have reviewed the patient's chart and labs.  Questions were answered to the patient's satisfaction.     Sandra Roy ANDREW

## 2018-02-12 NOTE — Op Note (Signed)
DATE OF SURGERY:  02/12/2018  TIME: 12:35 PM  PATIENT NAME:  Sandra Roy    AGE: 62 y.o.   PRE-OPERATIVE DIAGNOSIS:  Right knee osteoarthritis  POST-OPERATIVE DIAGNOSIS:  Right knee osteoarthritis  PROCEDURE:  Procedure(s): RIGHT TOTAL KNEE ARTHROPLASTY  SURGEON:  Omunique Pederson ANDREW  ASSISTANT:  Bryson Stilwell, PA-C, present and scrubbed throughout the case, critical for assistance with exposure, retraction, instrumentation, and closure.  OPERATIVE IMPLANTS: Depuy PFC Sigma Rotating Platform.  Femur size 4, Tibia size 3, Patella size 32 3-peg oval button, with a 10 mm polyethylene insert.   PREOPERATIVE INDICATIONS:   Sandra Roy is a 62 y.o. year old female with end stage bone on bone arthritis of the knee who failed conservative treatment and elected for Total Knee Arthroplasty.   The risks, benefits, and alternatives were discussed at length including but not limited to the risks of infection, bleeding, nerve injury, stiffness, blood clots, the need for revision surgery, cardiopulmonary complications, among others, and they were willing to proceed.  OPERATIVE DESCRIPTION:  The patient was brought to the operative room and placed in a supine position.  Spinal anesthesia was administered.  IV antibiotics were given.  The lower extremity was prepped and draped in the usual sterile fashion.  Time out was performed.  The leg was elevated and exsanguinated and the tourniquet was inflated.  Anterior quadriceps tendon splitting approach was performed.  The patella was retracted and osteophytes were removed.  The anterior horn of the medial and lateral meniscus was removed and cruciate ligaments resected.   The distal femur was opened with the drill and the intramedullary distal femoral cutting jig was utilized, set at 5 degrees resecting 10 mm off the distal femur.  Care was taken to protect the collateral ligaments.  The distal femoral sizing jig was applied,  taking care to avoid notching.  Then the 4-in-1 cutting jig was applied and the anterior and posterior femur was cut, along with the chamfer cuts.    Then the extramedullary tibial cutting jig was utilized making the appropriate cut using the anterior tibial crest as a reference building in appropriate posterior slope.  Care was taken during the cut to protect the medial and collateral ligaments.  The proximal tibia was removed along with the posterior horns of the menisci.   The posterior medial femoral osteophytes and posterior lateral femoral osteophytes were removed.    The flexion gap was then measured and was symmetric with the extension gap, measured at 10.  I completed the distal femoral preparation using the appropriate jig to prepare the box.  The patella was then measured, and cut with the saw.    The proximal tibia sized and prepared accordingly with the reamer and the punch, and then all components were trialed with the trial insert.  The knee was found to have excellent balance and full motion.    The above named components were then cemented into place and all excess cement was removed.  The trial polyethylene component was in place during cementation, and then was exchanged for the real polyethylene component.    The knee was easily taken through a range of motion and the patella tracked well and the knee irrigated copiously and the parapatellar and subcutaneous tissue closed with vicryl, and monocryl with steri strips for the skin.  The arthrotomy was closed at 90 of flexion. The wounds were dressed with sterile gauze and the tourniquet released and the patient was awakened and returned to the PACU  in stable and satisfactory condition.  There were no complications.  Total tourniquet time was 75 minutes.

## 2018-02-12 NOTE — Anesthesia Procedure Notes (Signed)
Anesthesia Regional Block: Adductor canal block   Pre-Anesthetic Checklist: ,, timeout performed, Correct Patient, Correct Site, Correct Laterality, Correct Procedure, Correct Position, site marked, Risks and benefits discussed,  Surgical consent,  Pre-op evaluation,  At surgeon's request and post-op pain management  Laterality: Right and Lower  Prep: chloraprep       Needles:  Injection technique: Single-shot  Needle Type: Echogenic Needle     Needle Length: 9cm  Needle Gauge: 21     Additional Needles:   Procedures:,,,, ultrasound used (permanent image in chart),,,,  Narrative:  Start time: 02/12/2018 8:58 AM End time: 02/12/2018 9:06 AM Injection made incrementally with aspirations every 5 mL.  Performed by: Personally  Anesthesiologist: Annye Asa, MD  Additional Notes: Pt identified in Holding room.  Monitors applied. Working IV access confirmed. Sterile prep R thigh.  #21ga ECHOgenic needle into adductor canal with US guidance.  20cc 0.75% Ropivacaine injected incrementally after negative test dose.  Patient asymptomatic, VSS, no heme aspirated, tolerated well.  Jenita Seashore, MD

## 2018-02-13 LAB — BASIC METABOLIC PANEL
Anion gap: 7 (ref 5–15)
BUN: 11 mg/dL (ref 8–23)
CO2: 24 mmol/L (ref 22–32)
Calcium: 8.3 mg/dL — ABNORMAL LOW (ref 8.9–10.3)
Chloride: 110 mmol/L (ref 98–111)
Creatinine, Ser: 0.52 mg/dL (ref 0.44–1.00)
GFR calc Af Amer: >60 mL/min (ref >60)
GFR calc non Af Amer: >60 mL/min (ref >60)
Glucose, Bld: 116 mg/dL — ABNORMAL HIGH (ref 70–99)
Potassium: 4.3 mmol/L (ref 3.5–5.1)
Sodium: 141 mmol/L (ref 135–145)

## 2018-02-13 LAB — CBC
HCT: 36.2 % (ref 36.0–46.0)
Hemoglobin: 11.3 g/dL — ABNORMAL LOW (ref 12.0–15.0)
MCH: 30.8 pg (ref 26.0–34.0)
MCHC: 31.2 g/dL (ref 30.0–36.0)
MCV: 98.6 fL (ref 80.0–100.0)
Platelets: 191 10*3/uL (ref 150–400)
RBC: 3.67 MIL/uL — ABNORMAL LOW (ref 3.87–5.11)
RDW: 12.9 % (ref 11.5–15.5)
WBC: 10 10*3/uL (ref 4.0–10.5)
nRBC: 0 % (ref 0.0–0.2)

## 2018-02-13 LAB — GLUCOSE, CAPILLARY: Glucose-Capillary: 88 mg/dL (ref 70–99)

## 2018-02-13 NOTE — Care Management Note (Signed)
Case Management Note  Patient Details  Name: Sandra Roy MRN: [id number] Date of Birth: 28-Jan-1956  Subjective/Objective:      Right TKA              Action/Plan: NCM spoke to pt and she has RW and bedside commode at home. Offered choice for [facility].( preoperatively arranged with [facility]). Pt agreeable to [facility]. Husband will be at home to assist with care.   Expected Discharge Date:  02/14/18               Expected Discharge Plan:  [place]  In-House Referral:  NA  Discharge planning Services  CM Consult  Post Acute Care Choice:  Home Health Choice offered to:  Patient  DME Arranged:  N/A DME Agency:  NA  HH Arranged:  PT [place] Agency:  [facility] (formerly [employer])  Status of Service:  Completed, signed off  If discussed at [employer], dates discussed:    Additional Comments:  Erenest Rasher, RN 02/13/2018, 9:37 AM

## 2018-02-13 NOTE — Plan of Care (Signed)
No changes needed to care plans.

## 2018-02-13 NOTE — Progress Notes (Signed)
    Subjective:  Patient reports pain as mild to moderate.  Denies N/V/CP/SOB. Got dizzy with PT.  Objective:   VITALS:   Vitals:   02/12/18 2014 02/12/18 2124 02/13/18 0225 02/13/18 0515  BP: 113/64 114/64 136/71 133/75  Pulse: 69 74 (!) 56 63  Resp:    16  Temp:  98.1 F (36.7 C) (!) 97.5 F (36.4 C) (!) 97.5 F (36.4 C)  TempSrc:  Oral Oral Oral  SpO2: 97% 97% 98% 100%  Weight:      Height:        NAD ABD soft Sensation intact distally Intact pulses distally Dorsiflexion/Plantar flexion intact Incision: dressing C/D/I Compartment soft HV ss  Lab Results  Component Value Date   WBC 10.0 02/13/2018   HGB 11.3 (L) 02/13/2018   HCT 36.2 02/13/2018   MCV 98.6 02/13/2018   PLT 191 02/13/2018   BMET    Component Value Date/Time   NA 141 02/13/2018 0350   K 4.3 02/13/2018 0350   CL 110 02/13/2018 0350   CO2 24 02/13/2018 0350   GLUCOSE 116 (H) 02/13/2018 0350   BUN 11 02/13/2018 0350   CREATININE 0.52 02/13/2018 0350   CREATININE 0.65 08/28/2016 0821   CALCIUM 8.3 (L) 02/13/2018 0350   GFRNONAA >60 02/13/2018 0350   GFRAA >60 02/13/2018 0350     Assessment/Plan: 1 Day Post-Op   Active Problems:   Osteoarthritis of right knee   S/P knee replacement   WBAT with walker DVT ppx: Lovenox, SCDs, TEDS PO pain control PT/OT Dispo: D/C HV drain, D/C home tomorrow   Sandra Roy 02/13/2018, 9:15 AM   Rod Can, MD Cell: [phone] [place] [facility]  [place] [address], [address], [place], [place] [address] Phone: [phone] [email or web address] Facebook  [employer]

## 2018-02-13 NOTE — Progress Notes (Signed)
Physical Therapy Treatment Patient Details Name: Sandra Roy MRN: [id number] DOB: Mar 24, 1956 Today's Date: 02/13/2018    History of Present Illness s/p R TKA     PT Comments    Pt ambulated to bathroom, then to recliner. She reported feeling "woozy" while in bathroom. Pt reports h/o passing out after surgery. BP 95/66, HR 60, SaO2 95% in sitting. Initiated TKA exercises but pt reported she was feeling worse (anxiety, reflux, and "woozy"), RN notified. Will attempt again later today.    Follow Up Recommendations  Follow surgeon's recommendation for DC plan and follow-up therapies(pt stated she doesn't know what plan is for f/u PT, she doesn't have an outpt PT appointment)     Equipment Recommendations  None recommended by PT    Recommendations for Other Services       Precautions / Restrictions Precautions Precautions: Fall;Knee Precaution Booklet Issued: Yes (comment) Precaution Comments: reviewed no pillow under knee Required Braces or Orthoses: Knee Immobilizer - Right Restrictions Weight Bearing Restrictions: No Other Position/Activity Restrictions: WBAT    Mobility  Bed Mobility Overal bed mobility: Needs Assistance Bed Mobility: Supine to Sit     Supine to sit: Supervision;HOB elevated     General bed mobility comments: no assist needed  Transfers Overall transfer level: Needs assistance Equipment used: Rolling walker (2 wheeled) Transfers: Sit to/from Stand Sit to Stand: Min guard         General transfer comment: min/guard for safety, VCs hand placement  Ambulation/Gait Ambulation/Gait assistance: Min assist;Min guard Gait Distance (Feet): 24 Feet Assistive device: Rolling walker (2 wheeled) Gait Pattern/deviations: Step-to pattern;Decreased weight shift to right     General Gait Details: cues for sequence and RW position/distance from self, walked to bathroom then to recliner, distance limited by feeling "woozy", BP 95/66 sitting, HR 60,  SaO2 95% on RA, RN notified as pt began to feel worse after 10 min seated rest, she reported feeling shaky, anxious, and having reflux   Stairs             Wheelchair Mobility    Modified Rankin (Stroke Patients Only)       Balance Overall balance assessment: Needs assistance   Sitting balance-Leahy Scale: Good     Standing balance support: Bilateral upper extremity supported Standing balance-Leahy Scale: Poor Standing balance comment: relies on BUE support                            Cognition Arousal/Alertness: Awake/alert Behavior During Therapy: WFL for tasks assessed/performed;Anxious Overall Cognitive Status: Within Functional Limits for tasks assessed                                        Exercises Total Joint Exercises Ankle Circles/Pumps: AROM;Both;10 reps Quad Sets: Both;AROM;10 reps    General Comments        Pertinent Vitals/Pain Pain Score: 5  Pain Location: right knee Pain Descriptors / Indicators: Discomfort;Grimacing;Guarding Pain Intervention(s): Limited activity within patient's tolerance;Monitored during session;Premedicated before session    Home Living                      Prior Function            PT Goals (current goals can now be found in the care plan section) Acute Rehab PT Goals Time For Goal Achievement: 02/19/18 Potential to Achieve  Goals: Good Progress towards PT goals: Not progressing toward goals - comment(medical complications)    Frequency    7X/week      PT Plan Current plan remains appropriate    Co-evaluation              AM-PAC PT "6 Clicks" Daily Activity  Outcome Measure  Difficulty turning over in bed (including adjusting bedclothes, sheets and blankets)?: A Little Difficulty moving from lying on back to sitting on the side of the bed? : A Little Difficulty sitting down on and standing up from a chair with arms (e.g., wheelchair, bedside commode, etc,.)?:  A Little Help needed moving to and from a bed to chair (including a wheelchair)?: A Little Help needed walking in hospital room?: A Little Help needed climbing 3-5 steps with a railing? : A Lot 6 Click Score: 17    End of Session Equipment Utilized During Treatment: Gait belt Activity Tolerance: Treatment limited secondary to medical complications (Comment)(lightheadedness) Patient left: in chair;with call bell/phone within reach;with nursing/sitter in room Nurse Communication: Mobility status;Other (comment)(pt feeling anxious, reflux, woozy) PT Visit Diagnosis: Difficulty in walking, not elsewhere classified (R26.2)     Time: 9311-2162 PT Time Calculation (min) (ACUTE ONLY): 41 min  Charges:  $Gait Training: 8-22 mins $Therapeutic Exercise: 8-22 mins $Therapeutic Activity: 8-22 mins                     Blondell Reveal Kistler PT 02/13/2018  Acute Rehabilitation Services Pager [phone] Office [phone]

## 2018-02-13 NOTE — Progress Notes (Signed)
Rn to room after being called by physical therapy. When RN entered room pt was safely placed back in chair by physical therapy. CBG obtained was 88, snack was provided to pt. Pt manually bp was 94/44, taken by rn. Pt did state she has passed out after surgery in the past. Pt also noted that she was anxious about going home and doing steps with physical therapy as well. RN concerned that would not be safe to go home after "woozy" spell this am with ambulation to bathroom. RN will call provider to discuss this concern.

## 2018-02-13 NOTE — Progress Notes (Signed)
Physical Therapy Treatment Patient Details Name: Sandra Roy MRN: [id number] DOB: [birth year] Today's Date: 02/13/2018    History of Present Illness s/p R TKA     PT Comments    Pt ambulated 130' with RW, no loss of balance. She is progressing well with mobility.  Will plan to do stair training tomorrow morning.   Follow Up Recommendations  Follow surgeon's recommendation for DC plan and follow-up therapies(pt stated she doesn't know what plan is for f/u PT, she doesn't have an outpt PT appointment)     Equipment Recommendations  None recommended by PT    Recommendations for Other Services       Precautions / Restrictions Precautions Precautions: Fall;Knee Precaution Booklet Issued: Yes (comment) Precaution Comments: reviewed no pillow under knee Required Braces or Orthoses: Knee Immobilizer - Right Restrictions Weight Bearing Restrictions: No Other Position/Activity Restrictions: WBAT    Mobility  Bed Mobility Overal bed mobility: Needs Assistance Bed Mobility: Supine to Sit;Sit to Supine     Supine to sit: Supervision;HOB elevated Sit to supine: Min guard;Min assist   General bed mobility comments: up in recliner  Transfers Overall transfer level: Needs assistance Equipment used: Rolling walker (2 wheeled) Transfers: Sit to/from Stand Sit to Stand: Min guard         General transfer comment: min/guard for safety, VCs hand placement  Ambulation/Gait Ambulation/Gait assistance: Min guard Gait Distance (Feet): 130 Feet Assistive device: Rolling walker (2 wheeled) Gait Pattern/deviations: Step-to pattern;Decreased weight shift to right Gait velocity: decr   General Gait Details: good sequencing, no loss of balance   Stairs             Wheelchair Mobility    Modified Rankin (Stroke Patients Only)       Balance Overall balance assessment: Needs assistance   Sitting balance-Leahy Scale: Good     Standing balance support:  Bilateral upper extremity supported Standing balance-Leahy Scale: Poor Standing balance comment: relies on BUE support                            Cognition Arousal/Alertness: Awake/alert Behavior During Therapy: WFL for tasks assessed/performed Overall Cognitive Status: Within Functional Limits for tasks assessed                                        Exercises Total Joint Exercises Ankle Circles/Pumps: AROM;Both;10 reps Quad Sets: Both;AROM;10 reps Short Arc Quad: AROM;Right;10 reps;Supine Heel Slides: AAROM;Right;10 reps;Supine Hip ABduction/ADduction: AROM;Right;AAROM;10 reps;Supine Straight Leg Raises: AROM;Right;10 reps;Supine Knee Flexion: AROM;AAROM;Right;5 reps;Seated Goniometric ROM: 5-50* AAROM R knee    General Comments        Pertinent Vitals/Pain Pain Score: 5  Pain Location: right knee Pain Descriptors / Indicators: Discomfort;Grimacing;Guarding Pain Intervention(s): Limited activity within patient's tolerance;Monitored during session;Premedicated before session;Ice applied    Home Living                      Prior Function            PT Goals (current goals can now be found in the care plan section) Acute Rehab PT Goals Time For Goal Achievement: 02/19/18 Potential to Achieve Goals: Good Progress towards PT goals: Progressing toward goals    Frequency    7X/week      PT Plan Current plan remains appropriate    Co-evaluation  AM-PAC PT "6 Clicks" Daily Activity  Outcome Measure  Difficulty turning over in bed (including adjusting bedclothes, sheets and blankets)?: A Little Difficulty moving from lying on back to sitting on the side of the bed? : A Little Difficulty sitting down on and standing up from a chair with arms (e.g., wheelchair, bedside commode, etc,.)?: A Little Help needed moving to and from a bed to chair (including a wheelchair)?: A Little Help needed walking in hospital  room?: A Little Help needed climbing 3-5 steps with a railing? : A Lot 6 Click Score: 17    End of Session Equipment Utilized During Treatment: Gait belt Activity Tolerance: Patient tolerated treatment well(lightheadedness) Patient left: with call bell/phone within reach;with family/visitor present;in chair Nurse Communication: Mobility status PT Visit Diagnosis: Difficulty in walking, not elsewhere classified (R26.2)     Time: 6314-9702 PT Time Calculation (min) (ACUTE ONLY): 15 min  Charges:  $Gait Training: 8-22 mins $Therapeutic Exercise: 8-22 mins                     Blondell Reveal Kistler PT 02/13/2018  Acute Rehabilitation Services Pager [phone] Office [phone]

## 2018-02-13 NOTE — Progress Notes (Signed)
Physical Therapy Treatment Patient Details Name: Sandra Roy MRN: [id number] DOB: 11-02-1955 Today's Date: 02/13/2018    History of Present Illness s/p R TKA     PT Comments    Pt progressing well with PT, she stated she is feeling better than this morning. Instructed pt/spouse in HEP. Pt just returned to bed from being up in recliner for several hours and declined ambulation at this time. Will follow.   Follow Up Recommendations  Follow surgeon's recommendation for DC plan and follow-up therapies(pt stated she doesn't know what plan is for f/u PT, she doesn't have an outpt PT appointment)     Equipment Recommendations  None recommended by PT    Recommendations for Other Services       Precautions / Restrictions Precautions Precautions: Fall;Knee Precaution Booklet Issued: Yes (comment) Precaution Comments: reviewed no pillow under knee Required Braces or Orthoses: Knee Immobilizer - Right Restrictions Weight Bearing Restrictions: No Other Position/Activity Restrictions: WBAT    Mobility  Bed Mobility Overal bed mobility: Needs Assistance Bed Mobility: Supine to Sit;Sit to Supine     Supine to sit: Supervision;HOB elevated Sit to supine: Min guard;Min assist   General bed mobility comments: instrcted pt to self assist RLE with LLE back into bed  Transfers Overall transfer level: Needs assistance Equipment used: Rolling walker (2 wheeled) Transfers: Sit to/from Stand Sit to Stand: Min guard         General transfer comment: min/guard for safety, VCs hand placement  Ambulation/Gait Ambulation/Gait assistance: Min assist;Min guard Gait Distance (Feet): 24 Feet Assistive device: Rolling walker (2 wheeled) Gait Pattern/deviations: Step-to pattern;Decreased weight shift to right     General Gait Details: pt declined ambulation, she just got back into bed   Stairs             Wheelchair Mobility    Modified Rankin (Stroke Patients Only)        Balance Overall balance assessment: Needs assistance   Sitting balance-Leahy Scale: Good     Standing balance support: Bilateral upper extremity supported Standing balance-Leahy Scale: Poor Standing balance comment: relies on BUE support                            Cognition Arousal/Alertness: Awake/alert Behavior During Therapy: WFL for tasks assessed/performed;Anxious Overall Cognitive Status: Within Functional Limits for tasks assessed                                        Exercises Total Joint Exercises Ankle Circles/Pumps: AROM;Both;10 reps Quad Sets: Both;AROM;10 reps Short Arc Quad: AROM;Right;10 reps;Supine Heel Slides: AAROM;Right;10 reps;Supine Hip ABduction/ADduction: AROM;Right;AAROM;10 reps;Supine Straight Leg Raises: AROM;Right;10 reps;Supine Knee Flexion: AROM;AAROM;Right;5 reps;Seated Goniometric ROM: 5-50* AAROM R knee    General Comments        Pertinent Vitals/Pain Pain Score: 5  Pain Location: right knee Pain Descriptors / Indicators: Discomfort;Grimacing;Guarding Pain Intervention(s): Limited activity within patient's tolerance;Monitored during session;Premedicated before session;Ice applied    Home Living                      Prior Function            PT Goals (current goals can now be found in the care plan section) Acute Rehab PT Goals Time For Goal Achievement: 02/19/18 Potential to Achieve Goals: Good Progress towards PT goals: Progressing toward goals  Frequency    7X/week      PT Plan Current plan remains appropriate    Co-evaluation              AM-PAC PT "6 Clicks" Daily Activity  Outcome Measure  Difficulty turning over in bed (including adjusting bedclothes, sheets and blankets)?: A Little Difficulty moving from lying on back to sitting on the side of the bed? : A Little Difficulty sitting down on and standing up from a chair with arms (e.g., wheelchair, bedside  commode, etc,.)?: A Little Help needed moving to and from a bed to chair (including a wheelchair)?: A Little Help needed walking in hospital room?: A Little Help needed climbing 3-5 steps with a railing? : A Lot 6 Click Score: 17    End of Session Equipment Utilized During Treatment: Gait belt Activity Tolerance: Patient tolerated treatment well(lightheadedness) Patient left: with call bell/phone within reach;in bed;with family/visitor present Nurse Communication: Mobility status PT Visit Diagnosis: Difficulty in walking, not elsewhere classified (R26.2)     Time: 3291-9166 PT Time Calculation (min) (ACUTE ONLY): 15 min  Charges:  $Gait Training: 8-22 mins $Therapeutic Exercise: 8-22 mins $Therapeutic Activity: 8-22 mins                     Blondell Reveal Kistler PT 02/13/2018  Acute Rehabilitation Services Pager [phone] Office [phone]

## 2018-02-14 LAB — CBC
HCT: 32.4 % — ABNORMAL LOW (ref 36.0–46.0)
Hemoglobin: 10.4 g/dL — ABNORMAL LOW (ref 12.0–15.0)
MCH: 31.2 pg (ref 26.0–34.0)
MCHC: 32.1 g/dL (ref 30.0–36.0)
MCV: 97.3 fL (ref 80.0–100.0)
Platelets: 202 10*3/uL (ref 150–400)
RBC: 3.33 MIL/uL — ABNORMAL LOW (ref 3.87–5.11)
RDW: 13 % (ref 11.5–15.5)
WBC: 9.1 10*3/uL (ref 4.0–10.5)
nRBC: 0 % (ref 0.0–0.2)

## 2018-02-14 NOTE — Progress Notes (Signed)
Pt to d/c home with family. No needs at this time. Pt equipment and home health arranged. No questions on d/c instructions and education given.

## 2018-02-14 NOTE — Plan of Care (Signed)
Pt stable at this time. RN medicating for pain. Pt to d/c home today.

## 2018-02-14 NOTE — Progress Notes (Signed)
     Subjective: 2 Days Post-Op Procedure(s) (LRB): RIGHT TOTAL KNEE ARTHROPLASTY (Right)   Patient reports pain as moderate, feels that she is a little behind on the analgesic medication. No reported events throughout the night.  Dressing changed and discussed wound care. Discharge home today if she does well with PT and pain stays controlled.    Objective:   VITALS:   Vitals:   02/13/18 2236 02/14/18 0615  BP: 110/68 (!) 142/64  Pulse: 82 81  Resp: 16 16  Temp: 98.6 F (37 C) 98.4 F (36.9 C)  SpO2: 94% 92%    Dorsiflexion/Plantar flexion intact Incision: dressing C/D/I No cellulitis present Compartment soft  LABS Recent Labs    02/13/18 0350 02/14/18 0446  HGB 11.3* 10.4*  HCT 36.2 32.4*  WBC 10.0 9.1  PLT 191 202    Recent Labs    02/13/18 0350  NA 141  K 4.3  BUN 11  CREATININE 0.52  GLUCOSE 116*     Assessment/Plan: 2 Days Post-Op Procedure(s) (LRB): RIGHT TOTAL KNEE ARTHROPLASTY (Right)  Dressing changed, and care instructions gived Up with therapy Discharge home with OPPT Instructed to call the office on Monday to set up [place]. Ezequias Lard   PAC  02/14/2018, 8:31 AM

## 2018-02-14 NOTE — Progress Notes (Signed)
Physical Therapy Treatment Patient Details Name: Sandra Roy MRN: [id number] DOB: Aug 04, 1955 Today's Date: 02/14/2018    History of Present Illness s/p R TKA     PT Comments    Pt  Doing  Well, some c/o pain--discussed normalcy of this on POD #2; pt practiced stairs and amb with min c/o pain; reviewed HEP--pt to perform later today,  has good understanding of exercises although feels her pain will be too bad if we complete HEP  at this time;  pt ready for d/c from PT standpoint  Follow Up Recommendations  Follow surgeon's recommendation for DC plan and follow-up therapies(original plan for HHPT per [facility])     Equipment Recommendations  None recommended by PT    Recommendations for Other Services       Precautions / Restrictions Precautions Precautions: Fall;Knee Required Braces or Orthoses: Knee Immobilizer - Right Restrictions Weight Bearing Restrictions: No Other Position/Activity Restrictions: WBAT    Mobility  Bed Mobility               General bed mobility comments: EOB with nursing  Transfers Overall transfer level: Needs assistance Equipment used: Rolling walker (2 wheeled) Transfers: Sit to/from Stand Sit to Stand: Supervision         General transfer comment: verbal cues for hand placement  Ambulation/Gait Ambulation/Gait assistance: Supervision Gait Distance (Feet): 40 Feet Assistive device: Rolling walker (2 wheeled) Gait Pattern/deviations: Step-to pattern;Decreased weight shift to right     General Gait Details: cues to use UEs to allow improved pain control RLE   Stairs Stairs: Yes Stairs assistance: Min assist;Min guard Stair Management: One rail Right;With walker;Backwards;Forwards Number of Stairs: 6 General stair comments: 5 stairs with rail and RW on side, forwards; one step backward with RW   Wheelchair Mobility    Modified Rankin (Stroke Patients Only)       Balance             Standing balance-Leahy  Scale: Fair                              Cognition Arousal/Alertness: Awake/alert Behavior During Therapy: WFL for tasks assessed/performed Overall Cognitive Status: Within Functional Limits for tasks assessed                                        Exercises Total Joint Exercises Ankle Circles/Pumps: AROM;Both;10 reps Quad Sets: (limited by pain; reviewed HEP for practice later)    General Comments        Pertinent Vitals/Pain Pain Assessment: 0-10 Pain Score: 4  Pain Location: right knee Pain Descriptors / Indicators: Discomfort;Grimacing;Guarding Pain Intervention(s): Limited activity within patient's tolerance;Monitored during session;Ice applied;Repositioned    Home Living                      Prior Function            PT Goals (current goals can now be found in the care plan section) Acute Rehab PT Goals Time For Goal Achievement: 02/19/18 Potential to Achieve Goals: Good Progress towards PT goals: Progressing toward goals    Frequency    7X/week      PT Plan Current plan remains appropriate    Co-evaluation              AM-PAC PT "6 Clicks" Daily Activity  Outcome Measure  Difficulty turning over in bed (including adjusting bedclothes, sheets and blankets)?: A Little Difficulty moving from lying on back to sitting on the side of the bed? : A Little Difficulty sitting down on and standing up from a chair with arms (e.g., wheelchair, bedside commode, etc,.)?: A Little Help needed moving to and from a bed to chair (including a wheelchair)?: A Little Help needed walking in hospital room?: A Little Help needed climbing 3-5 steps with a railing? : A Little 6 Click Score: 18    End of Session Equipment Utilized During Treatment: Gait belt Activity Tolerance: Patient tolerated treatment well Patient left: in chair;with call bell/phone within reach;with chair alarm set   PT Visit Diagnosis: Difficulty in  walking, not elsewhere classified (R26.2)     Time: 1121-6244 PT Time Calculation (min) (ACUTE ONLY): 29 min  Charges:  $Gait Training: 23-37 mins                     Kenyon Ana, PT  Pager: [phone] Acute Rehab Dept [facility]): [phone]   02/14/2018    [facility] 02/14/2018, 10:51 AM

## 2018-02-15 ENCOUNTER — Encounter (HOSPITAL_COMMUNITY): Payer: Self-pay | Admitting: Specialist

## 2018-02-20 ENCOUNTER — Emergency Department (HOSPITAL_COMMUNITY): Payer: BLUE CROSS/BLUE SHIELD

## 2018-02-20 ENCOUNTER — Emergency Department (HOSPITAL_COMMUNITY)
Admission: EM | Admit: 2018-02-20 | Discharge: 2018-02-20 | Disposition: A | Discharge status: Home / Self Care | Payer: BLUE CROSS/BLUE SHIELD | Attending: Emergency Medicine | Admitting: Emergency Medicine

## 2018-02-20 ENCOUNTER — Other Ambulatory Visit: Payer: Self-pay

## 2018-02-20 ENCOUNTER — Encounter (HOSPITAL_COMMUNITY): Payer: Self-pay

## 2018-02-20 DIAGNOSIS — Z87891 Personal history of nicotine dependence: Secondary | ICD-10-CM | POA: Insufficient documentation

## 2018-02-20 DIAGNOSIS — K5641 Fecal impaction: Secondary | ICD-10-CM | POA: Insufficient documentation

## 2018-02-20 DIAGNOSIS — Z96651 Presence of right artificial knee joint: Secondary | ICD-10-CM | POA: Insufficient documentation

## 2018-02-20 DIAGNOSIS — Z79899 Other long term (current) drug therapy: Secondary | ICD-10-CM | POA: Insufficient documentation

## 2018-02-20 DIAGNOSIS — K5900 Constipation, unspecified: Secondary | ICD-10-CM

## 2018-02-20 DIAGNOSIS — E039 Hypothyroidism, unspecified: Secondary | ICD-10-CM | POA: Diagnosis not present

## 2018-02-20 NOTE — ED Provider Notes (Signed)
[facility] EMERGENCY DEPARTMENT Provider Note   CSN: [id number] Arrival date & time: 02/20/18  1025     History   Chief Complaint Chief Complaint  Patient presents with  . Constipation    HPI Sandra Roy is a 62 y.o. female.  HPI Pt was seen at 0910. Per pt, c/o gradual onset and persistence of constant constipation for the past 2 to 3 days. Pt states she has been taking oxycodone s/p knee surgery 1 week ago. Pt states she took one dose of miralax yesterday without change in her symptoms. Pt states she feels like stool is "stuck" in her rectum and she cannot push it out. Denies abd pain, no N/V/D, no back pain, no rectal bleeding, no black or blood in stools, no fevers, no rectal discharge.    Past Medical History:  Diagnosis Date  . Adnexal mass 05/2000   Left  . Anemia    history of  . Anxiety   . Autoimmune thyroiditis   . Carpal tunnel syndrome    Right  . Depression   . Factor V Leiden mutation ([place])    [place]  . Foot fracture, left 05/06/2017  . Hashimoto's disease   . Hemorrhoids 04/25/2004   Mild external and internal noted on colonoscopy  . History of colon polyps 04/25/2004  . History of gastritis 04/25/2004  . History of hiatal hernia 06/21/2002  . History of menorrhagia   . Hypoglycemic syndrome   . Hypothyroidism   . Lipoma    Bilateral ankles  . NSVD (normal spontaneous vaginal delivery)    x3  . OA (osteoarthritis)   . Osteopenia   . Osteopenia    Mild  . PONV (postoperative nausea and vomiting)    Hysterectomy surgery blood pressure dropped  . Thyroid nodule 03/23/2013   Bilateral 47mm , noted on [place]  . Vasovagal syncope    salty drinks help (gatorade)  . Vitamin D deficiency     Patient Active Problem List   Diagnosis Date Noted  . Osteoarthritis of right knee 02/12/2018  . S/P knee replacement 02/12/2018  . History of colonic polyps 06/26/2014  . Anxiety 06/26/2014  . Dyspareunia 06/22/2013  . Vaginal atrophy 06/22/2013    . Factor 5 Leiden mutation, heterozygous ([place]) 06/21/2012  . Osteopenia   . Hashimoto's disease     Past Surgical History:  Procedure Laterality Date  . ABDOMINAL HYSTERECTOMY  2003   TAH/BSO  . ANKLE SURGERY Bilateral 09/18/2017   excision lipomas   . BLEPHAROPLASTY Bilateral   . CARPAL TUNNEL RELEASE Right 2003  . COLONOSCOPY    . DILATION AND CURETTAGE OF UTERUS    . ENDOMETRIAL ABLATION    . KNEE ARTHROSCOPY  2010   RIGHT  . PELVIC LAPAROSCOPY     LSO  . TOTAL KNEE ARTHROPLASTY Right 02/12/2018   Procedure: RIGHT TOTAL KNEE ARTHROPLASTY;  Surgeon: Sydnee Cabal, MD;  Location: [facility] ORS;  Service: Orthopedics;  Laterality: Right;  . TUBAL LIGATION  1976  . UPPER GI ENDOSCOPY       OB History    Gravida  4   Para  3   Term  3   Preterm      AB  1   Living  3     SAB  1   TAB      Ectopic      Multiple      Live Births  3  Home Medications    Prior to Admission medications   Medication Sig Start Date End Date Taking? Authorizing Provider  ALPRAZolam Duanne Moron) 0.25 MG tablet Take 0.25 mg by mouth at bedtime as needed for anxiety.    [provider]  aspirin EC 325 MG tablet Take 1 tablet (325 mg total) by mouth 2 (two) times daily. 02/12/18 02/12/19  Stilwell, Bryson L, PA-C  cetirizine-pseudoephedrine (ZYRTEC-D) 5-120 MG tablet Take 1 tablet by mouth at bedtime.    [provider]  Cholecalciferol (VITAMIN D3) 2000 units CHEW Chew 4,000 Units by mouth daily.    [provider]  fluticasone (FLONASE) 50 MCG/ACT nasal spray Place 1 spray into both nostrils daily as needed for allergies.     [provider]  levothyroxine (SYNTHROID, LEVOTHROID) 88 MCG tablet Take 88 mcg by mouth daily before breakfast.    [provider]  methocarbamol (ROBAXIN) 500 MG tablet Take 1 tablet (500 mg total) by mouth every 8 (eight) hours as needed for muscle spasms. 02/12/18   Stilwell, Bryson L, PA-C  Multiple  Vitamins-Minerals (MULTIVITAMIN PO) Take 3 tablets by mouth daily.    [provider]  omeprazole (PRILOSEC) 40 MG capsule Take 40 mg by mouth daily.  06/29/17   [provider]  valACYclovir (VALTREX) 1000 MG tablet Take 1,000 mg by mouth 2 (two) times daily as needed (for fever blisters).  04/23/17   [provider]    Family History Family History  Problem Relation Age of Onset  . Diabetes Father   . Heart disease Father   . Hypertension Sister   . Cancer Sister        LUNG  (SMOKER)  . Diabetes Brother   . Hypertension Brother     Social History Social History   Tobacco Use  . Smoking status: Former Smoker    Last attempt to quit: 06/17/1981    Years since quitting: 36.7  . Smokeless tobacco: Never Used  Substance Use Topics  . Alcohol use: No    Alcohol/week: 0.0 standard drinks  . Drug use: No     Allergies   Adhesive [tape]; Aminoquinolines; Azithromycin; Bactrim [sulfamethoxazole-trimethoprim]; Clarithromycin; Naproxen sodium; Other; and Vicodin [hydrocodone-acetaminophen]   Review of Systems Review of Systems ROS: Statement: All systems negative except as marked or noted in the HPI; Constitutional: Negative for fever and chills. ; ; Eyes: Negative for eye pain, redness and discharge. ; ; ENMT: Negative for ear pain, hoarseness, nasal congestion, sinus pressure and sore throat. ; ; Cardiovascular: Negative for chest pain, palpitations, diaphoresis, dyspnea and peripheral edema. ; ; Respiratory: Negative for cough, wheezing and stridor. ; ; Gastrointestinal: +constipation. Negative for nausea, vomiting, diarrhea, abdominal pain, blood in stool, hematemesis, jaundice and rectal bleeding. . ; ; Genitourinary: Negative for dysuria, flank pain and hematuria. ; ; Musculoskeletal: Negative for back pain and neck pain. Negative for swelling and trauma.; ; Skin: Negative for pruritus, rash, abrasions, blisters, bruising and skin lesion.; ; Neuro: Negative  for headache, lightheadedness and neck stiffness. Negative for weakness, altered level of consciousness, altered mental status, extremity weakness, paresthesias, involuntary movement, seizure and syncope.       Physical Exam Updated Vital Signs BP 107/66   Pulse 75   Temp 97.7 F (36.5 C) (Oral)   Resp 18   Ht 5\' 4"  (1.626 m)   Wt 89.8 kg   SpO2 97%   BMI 33.99 kg/m   Physical Exam 0915: Physical examination:  Nursing notes reviewed; Vital signs  and O2 SAT reviewed;  Constitutional: Well developed, Well nourished, Well hydrated, In no acute distress; Head:  Normocephalic, atraumatic; Eyes: EOMI, PERRL, No scleral icterus; ENMT: Mouth and pharynx normal, Mucous membranes moist; Neck: Supple, Full range of motion, No lymphadenopathy; Cardiovascular: Regular rate and rhythm, No gallop; Respiratory: Breath sounds clear & equal bilaterally, No wheezes.  Speaking full sentences with ease, Normal respiratory effort/excursion; Chest: Nontender, Movement normal; Abdomen: Soft, Nontender, Nondistended, Normal bowel sounds.  Rectal exam performed w/permission of pt and ED RN chaperone present:  Anal tone normal. +fecal impaction of soft brown stool. No fissures, no external hemorrhoids, no palp masses.;;; Genitourinary: No CVA tenderness; Extremities: Peripheral pulses normal, No tenderness, No edema, No calf edema or asymmetry.; Neuro: AA&Ox3, Major CN grossly intact.  Speech clear. No gross focal motor or sensory deficits in extremities.; Skin: Color normal, Warm, Dry.     ED Treatments / Results  Labs (all labs ordered are listed, but only abnormal results are displayed)   EKG None  Radiology   Procedures Procedures (including critical care time)  Medications Ordered in ED Medications - No data to display   Initial Impression / Assessment and Plan / ED Course  I have reviewed the triage vital signs and the nursing notes.  Pertinent labs & imaging results that were available  during my care of the patient were reviewed by me and considered in my medical decision making (see chart for details).  MDM Reviewed: previous chart, nursing note and vitals Interpretation: x-ray    Dg Abd Acute W/chest Result Date: 02/20/2018 CLINICAL DATA:  Constipation. EXAM: DG ABDOMEN ACUTE W/ 1V CHEST COMPARISON:  Chest radiographs, 04/04/2014 FINDINGS: There is no bowel dilation to suggest obstruction. There is no free air. Moderate increased stool noted in the colon and rectum. There are few colonic air-fluid levels on the erect view. Soft tissues are unremarkable. Heart, mediastinum hila are unremarkable.  Lungs are clear. No acute skeletal abnormality. IMPRESSION: 1. No acute findings.  No evidence of bowel obstruction or free air. 2. Moderate increased stool burden in the colon with increased rectal stool. 3. No active cardiopulmonary disease. Electronically Signed   By: Lajean Manes M.D.   On: 02/20/2018 09:48     1045:  Pt disimpacted, then went to bathroom to have BM. States she "feels better" and is ready to go home now. AXR as above. Dx and testing d/w pt and family.  Questions answered.  Verb understanding, agreeable to d/c home with outpt f/u.   Final Clinical Impressions(s) / ED Diagnoses   Final diagnoses:  Constipation, unspecified constipation type  Fecal impaction in rectum [facility])    ED Discharge Orders    None       Francine Graven, DO 02/24/18 0732

## 2018-02-20 NOTE — ED Triage Notes (Signed)
Pt reports has been constipated. LBM was 2 days ago. Reports had knee surgery on nov 1 and has been on iron and oxycodone.

## 2018-02-20 NOTE — Discharge Instructions (Signed)
Take over the counter laxative (such as miralax, milk of magnesia, senokot) AND a dulcolax suppository (or enema) today and repeat both tomorrow, if needed. Continue to take over the counter stool softener (such as colace or miralax), as directed on packaging, while you are taking the narcotic pain medication. Increase the fiber in your diet.  Continue to take your usual prescriptions as previously directed.  Call your regular medical doctor on Monday to schedule a follow up appointment within the next week.  Return to the Emergency Department immediately if worsening.

## 2018-02-23 NOTE — Discharge Summary (Signed)
Physician Discharge Summary  Patient ID: Sandra Roy MRN: [id number] DOB/AGE: [birth year] 64 y.o.  Admit date: 02/12/2018 Discharge date: 02/23/2018  Admission Diagnoses:Right knee OA  Discharge Diagnoses:  Active Problems:   Osteoarthritis of right knee   S/P knee replacement   Discharged Condition: good  Hospital Course:  Sandra Roy is a 62 y.o. who was admitted to [facility]. They were brought to the operating room on 02/12/2018 and underwent Procedure(s): RIGHT TOTAL KNEE ARTHROPLASTY.  Patient tolerated the procedure well and was later transferred to the recovery room and then to the orthopaedic floor for postoperative care.  They were given PO and IV analgesics for pain control following their surgery.  They were given 24 hours of postoperative antibiotics of  Anti-infectives (From admission, onward)   Start     Dose/Rate Route Frequency Ordered Stop   02/12/18 2000  ceFAZolin (ANCEF) IVPB 2g/100 mL premix     2 g 200 mL/hr over 30 Minutes Intravenous Every 6 hours 02/12/18 1910 02/13/18 0222   02/12/18 0959  valACYclovir (VALTREX) tablet 1,000 mg  Status:  Discontinued     1,000 mg Oral 2 times daily PRN 02/12/18 1000 02/14/18 1444   02/12/18 0800  ceFAZolin (ANCEF) IVPB 2g/100 mL premix     2 g 200 mL/hr over 30 Minutes Intravenous On call to O.R. 02/12/18 0753 02/12/18 1045     and started on DVT prophylaxis in the form of lovenox.   PT and OT were ordered for total joint protocol.  Discharge planning consulted to help with postop disposition and equipment needs.  Patient had a good night on the evening of surgery and started to get up OOB with therapy on day one.  Hemovac drain was pulled without difficulty.  Continued to work with therapy into day two.  Dressing was with normal limits.  The patient had progressed with therapy and meeting their goals. Patient was seen in rounds and was ready to go home.  Consults: n/a  Significant Diagnostic  Studies: routine  Treatments: routine  Discharge Exam: Blood pressure (!) 142/64, pulse 81, temperature 98.4 F (36.9 C), temperature source Oral, resp. rate 16, height 5\' 4"  (1.626 m), weight 87.5 kg, SpO2 91 %. Well nourished. Alert and oriented x3. RRR, Lungs clear, BS x4. Abdomen soft and non tender. Right Calf soft and non tender. Right knee dressing C/D/I. No DVT signs. Compartment soft. No signs of infection.  Right LE grossly neurovascular intact.  Disposition:   Discharge Instructions    Call MD / Call 911   Complete by:  As directed    If you experience chest pain or shortness of breath, CALL 911 and be transported to the hospital emergency room.  If you develope a fever above 101 F, pus (white drainage) or increased drainage or redness at the wound, or calf pain, call your surgeon's office.   Call MD / Call 911   Complete by:  As directed    If you experience chest pain or shortness of breath, CALL 911 and be transported to the hospital emergency room.  If you develope a fever above 101 F, pus (white drainage) or increased drainage or redness at the wound, or calf pain, call your surgeon's office.   Constipation Prevention   Complete by:  As directed    Drink plenty of fluids.  Prune juice may be helpful.  You may use a stool softener, such as Colace (over the counter) 100 mg twice a day.  Use MiraLax (over the counter) for constipation as needed.   Constipation Prevention   Complete by:  As directed    Drink plenty of fluids.  Prune juice may be helpful.  You may use a stool softener, such as Colace (over the counter) 100 mg twice a day.  Use MiraLax (over the counter) for constipation as needed.   Diet - low sodium heart healthy   Complete by:  As directed    Diet - low sodium heart healthy   Complete by:  As directed    Discharge instructions   Complete by:  As directed    INSTRUCTIONS AFTER JOINT REPLACEMENT   Remove items at home which could result in a fall. This  includes throw rugs or furniture in walking pathways ICE to the affected joint every three hours while awake for 30 minutes at a time, for at least the first 3-5 days, and then as needed for pain and swelling.  Continue to use ice for pain and swelling. You may notice swelling that will progress down to the foot and ankle.  This is normal after surgery.  Elevate your leg when you are not up walking on it.   Continue to use the breathing machine you got in the hospital (incentive spirometer) which will help keep your temperature down.  It is common for your temperature to cycle up and down following surgery, especially at night when you are not up moving around and exerting yourself.  The breathing machine keeps your lungs expanded and your temperature down.   DIET:  As you were doing prior to hospitalization, we recommend a well-balanced diet.  DRESSING / WOUND CARE / SHOWERING  Keep the surgical dressing until follow up.  The dressing is water proof, so you can shower without any extra covering.  IF THE DRESSING FALLS OFF or the wound gets wet inside, change the dressing with sterile gauze.  Please use good hand washing techniques before changing the dressing.  Do not use any lotions or creams on the incision until instructed by your surgeon.    ACTIVITY  Increase activity slowly as tolerated, but follow the weight bearing instructions below.   No driving for 6 weeks or until further direction given by your physician.  You cannot drive while taking narcotics.  No lifting or carrying greater than 10 lbs. until further directed by your surgeon. Avoid periods of inactivity such as sitting longer than an hour when not asleep. This helps prevent blood clots.  You may return to work once you are authorized by your doctor.     WEIGHT BEARING   Weight bearing as tolerated with assist device (walker, cane, etc) as directed, use it as long as suggested by your surgeon or therapist, typically at least  4-6 weeks.   EXERCISES  Results after joint replacement surgery are often greatly improved when you follow the exercise, range of motion and muscle strengthening exercises prescribed by your doctor. Safety measures are also important to protect the joint from further injury. Any time any of these exercises cause you to have increased pain or swelling, decrease what you are doing until you are comfortable again and then slowly increase them. If you have problems or questions, call your caregiver or physical therapist for advice.   Rehabilitation is important following a joint replacement. After just a few days of immobilization, the muscles of the leg can become weakened and shrink (atrophy).  These exercises are designed to build up the tone and  strength of the thigh and leg muscles and to improve motion. Often times heat used for twenty to thirty minutes before working out will loosen up your tissues and help with improving the range of motion but do not use heat for the first two weeks following surgery (sometimes heat can increase post-operative swelling).   These exercises can be done on a training (exercise) mat, on the floor, on a table or on a bed. Use whatever works the best and is most comfortable for you.    Use music or television while you are exercising so that the exercises are a pleasant break in your day. This will make your life better with the exercises acting as a break in your routine that you can look forward to.   Perform all exercises about fifteen times, three times per day or as directed.  You should exercise both the operative leg and the other leg as well.   Exercises include:   Quad Sets - Tighten up the muscle on the front of the thigh (Quad) and hold for 5-10 seconds.   Straight Leg Raises - With your knee straight (if you were given a brace, keep it on), lift the leg to 60 degrees, hold for 3 seconds, and slowly lower the leg.  Perform this exercise against resistance  later as your leg gets stronger.  Leg Slides: Lying on your back, slowly slide your foot toward your buttocks, bending your knee up off the floor (only go as far as is comfortable). Then slowly slide your foot back down until your leg is flat on the floor again.  Angel Wings: Lying on your back spread your legs to the side as far apart as you can without causing discomfort.  Hamstring Strength:  Lying on your back, push your heel against the floor with your leg straight by tightening up the muscles of your buttocks.  Repeat, but this time bend your knee to a comfortable angle, and push your heel against the floor.  You may put a pillow under the heel to make it more comfortable if necessary.   A rehabilitation program following joint replacement surgery can speed recovery and prevent re-injury in the future due to weakened muscles. Contact your doctor or a physical therapist for more information on knee rehabilitation.    CONSTIPATION  Constipation is defined medically as fewer than three stools per week and severe constipation as less than one stool per week.  Even if you have a regular bowel pattern at home, your normal regimen is likely to be disrupted due to multiple reasons following surgery.  Combination of anesthesia, postoperative narcotics, change in appetite and fluid intake all can affect your bowels.   YOU MUST use at least one of the following options; they are listed in order of increasing strength to get the job done.  They are all available over the counter, and you may need to use some, POSSIBLY even all of these options:    Drink plenty of fluids (prune juice may be helpful) and high fiber foods Colace 100 mg by mouth twice a day  Senokot for constipation as directed and as needed Dulcolax (bisacodyl), take with full glass of water  Miralax (polyethylene glycol) once or twice a day as needed.  If you have tried all these things and are unable to have a bowel movement in the first  3-4 days after surgery call either your surgeon or your primary doctor.    If you experience loose stools  or diarrhea, hold the medications until you stool forms back up.  If your symptoms do not get better within 1 week or if they get worse, check with your doctor.  If you experience "the worst abdominal pain ever" or develop nausea or vomiting, please contact the office immediately for further recommendations for treatment.   ITCHING:  If you experience itching with your medications, try taking only a single pain pill, or even half a pain pill at a time.  You can also use Benadryl over the counter for itching or also to help with sleep.   TED HOSE STOCKINGS:  Use stockings on both legs until for at least 2 weeks or as directed by physician office. They may be removed at night for sleeping.  MEDICATIONS:  See your medication summary on the "After Visit Summary" that nursing will review with you.  You may have some home medications which will be placed on hold until you complete the course of blood thinner medication.  It is important for you to complete the blood thinner medication as prescribed.  PRECAUTIONS:  If you experience chest pain or shortness of breath - call 911 immediately for transfer to the hospital emergency department.   If you develop a fever greater that 101 F, purulent drainage from wound, increased redness or drainage from wound, foul odor from the wound/dressing, or calf pain - CONTACT YOUR SURGEON.                                                   FOLLOW-UP APPOINTMENTS:  If you do not already have a post-op appointment, please call the office for an appointment to be seen by your surgeon.  Guidelines for how soon to be seen are listed in your "After Visit Summary", but are typically between 1-4 weeks after surgery.  OTHER INSTRUCTIONS:   Knee Replacement:  Do not place pillow under knee, focus on keeping the knee straight while resting. CPM instructions: 0-90 degrees, 2  hours in the morning, 2 hours in the afternoon, and 2 hours in the evening. Place foam block, curve side up under heel at all times except when in CPM or when walking.  DO NOT modify, tear, cut, or change the foam block in any way.  MAKE SURE YOU:  Understand these instructions.  Get help right away if you are not doing well or get worse.    Thank you for letting us be a part of your medical care team.  It is a privilege we respect greatly.  We hope these instructions will help you stay on track for a fast and full recovery!   Discharge instructions   Complete by:  As directed    INSTRUCTIONS AFTER JOINT REPLACEMENT   Remove items at home which could result in a fall. This includes throw rugs or furniture in walking pathways ICE to the affected joint every three hours while awake for 30 minutes at a time, for at least the first 3-5 days, and then as needed for pain and swelling.  Continue to use ice for pain and swelling. You may notice swelling that will progress down to the foot and ankle.  This is normal after surgery.  Elevate your leg when you are not up walking on it.   Continue to use the breathing machine you got in the hospital (incentive  spirometer) which will help keep your temperature down.  It is common for your temperature to cycle up and down following surgery, especially at night when you are not up moving around and exerting yourself.  The breathing machine keeps your lungs expanded and your temperature down.   DIET:  As you were doing prior to hospitalization, we recommend a well-balanced diet.  DRESSING / WOUND CARE / SHOWERING  You may change your dressing 3-5 days after surgery.  Then change the dressing every day with sterile gauze.  Please use good hand washing techniques before changing the dressing.  Do not use any lotions or creams on the incision until instructed by your surgeon. and You may shower 3 days after surgery, but keep the wounds dry during showering.  You  may use an occlusive plastic wrap (Press'n Seal for example), NO SOAKING/SUBMERGING IN THE BATHTUB.  If the bandage gets wet, change with a clean dry gauze.  If the incision gets wet, pat the wound dry with a clean towel.  ACTIVITY  Increase activity slowly as tolerated, but follow the weight bearing instructions below.   No driving for 6 weeks or until further direction given by your physician.  You cannot drive while taking narcotics.  No lifting or carrying greater than 10 lbs. until further directed by your surgeon. Avoid periods of inactivity such as sitting longer than an hour when not asleep. This helps prevent blood clots.  You may return to work once you are authorized by your doctor.     WEIGHT BEARING   Weight bearing as tolerated with assist device (walker, cane, etc) as directed, use it as long as suggested by your surgeon or therapist, typically at least 4-6 weeks.   EXERCISES  Results after joint replacement surgery are often greatly improved when you follow the exercise, range of motion and muscle strengthening exercises prescribed by your doctor. Safety measures are also important to protect the joint from further injury. Any time any of these exercises cause you to have increased pain or swelling, decrease what you are doing until you are comfortable again and then slowly increase them. If you have problems or questions, call your caregiver or physical therapist for advice.   Rehabilitation is important following a joint replacement. After just a few days of immobilization, the muscles of the leg can become weakened and shrink (atrophy).  These exercises are designed to build up the tone and strength of the thigh and leg muscles and to improve motion. Often times heat used for twenty to thirty minutes before working out will loosen up your tissues and help with improving the range of motion but do not use heat for the first two weeks following surgery (sometimes heat can  increase post-operative swelling).   These exercises can be done on a training (exercise) mat, on the floor, on a table or on a bed. Use whatever works the best and is most comfortable for you.    Use music or television while you are exercising so that the exercises are a pleasant break in your day. This will make your life better with the exercises acting as a break in your routine that you can look forward to.   Perform all exercises about fifteen times, three times per day or as directed.  You should exercise both the operative leg and the other leg as well.   Exercises include:   Quad Sets - Tighten up the muscle on the front of the thigh [employer]) and  hold for 5-10 seconds.   Straight Leg Raises - With your knee straight (if you were given a brace, keep it on), lift the leg to 60 degrees, hold for 3 seconds, and slowly lower the leg.  Perform this exercise against resistance later as your leg gets stronger.  Leg Slides: Lying on your back, slowly slide your foot toward your buttocks, bending your knee up off the floor (only go as far as is comfortable). Then slowly slide your foot back down until your leg is flat on the floor again.  Angel Wings: Lying on your back spread your legs to the side as far apart as you can without causing discomfort.  Hamstring Strength:  Lying on your back, push your heel against the floor with your leg straight by tightening up the muscles of your buttocks.  Repeat, but this time bend your knee to a comfortable angle, and push your heel against the floor.  You may put a pillow under the heel to make it more comfortable if necessary.   A rehabilitation program following joint replacement surgery can speed recovery and prevent re-injury in the future due to weakened muscles. Contact your doctor or a physical therapist for more information on knee rehabilitation.    CONSTIPATION  Constipation is defined medically as fewer than three stools per week and severe  constipation as less than one stool per week.  Even if you have a regular bowel pattern at home, your normal regimen is likely to be disrupted due to multiple reasons following surgery.  Combination of anesthesia, postoperative narcotics, change in appetite and fluid intake all can affect your bowels.   YOU MUST use at least one of the following options; they are listed in order of increasing strength to get the job done.  They are all available over the counter, and you may need to use some, POSSIBLY even all of these options:    Drink plenty of fluids (prune juice may be helpful) and high fiber foods Colace 100 mg by mouth twice a day  Senokot for constipation as directed and as needed Dulcolax (bisacodyl), take with full glass of water  Miralax (polyethylene glycol) once or twice a day as needed.  If you have tried all these things and are unable to have a bowel movement in the first 3-4 days after surgery call either your surgeon or your primary doctor.    If you experience loose stools or diarrhea, hold the medications until you stool forms back up.  If your symptoms do not get better within 1 week or if they get worse, check with your doctor.  If you experience "the worst abdominal pain ever" or develop nausea or vomiting, please contact the office immediately for further recommendations for treatment.   ITCHING:  If you experience itching with your medications, try taking only a single pain pill, or even half a pain pill at a time.  You can also use Benadryl over the counter for itching or also to help with sleep.   TED HOSE STOCKINGS:  Use stockings on both legs until for at least 2 weeks or as directed by physician office. They may be removed at night for sleeping.  MEDICATIONS:  See your medication summary on the "After Visit Summary" that nursing will review with you.  You may have some home medications which will be placed on hold until you complete the course of blood thinner  medication.  It is important for you to complete the blood thinner  medication as prescribed.  PRECAUTIONS:  If you experience chest pain or shortness of breath - call 911 immediately for transfer to the hospital emergency department.   If you develop a fever greater that 101 F, purulent drainage from wound, increased redness or drainage from wound, foul odor from the wound/dressing, or calf pain - CONTACT YOUR SURGEON.                                                   FOLLOW-UP APPOINTMENTS:  If you do not already have a post-op appointment, please call the office for an appointment to be seen by your surgeon.  Guidelines for how soon to be seen are listed in your "After Visit Summary", but are typically between 1-4 weeks after surgery.  OTHER INSTRUCTIONS:   Knee Replacement:  Do not place pillow under knee, focus on keeping the knee straight while resting. CPM instructions: 0-90 degrees, 2 hours in the morning, 2 hours in the afternoon, and 2 hours in the evening. Place foam block, curve side up under heel at all times except when in CPM or when walking.  DO NOT modify, tear, cut, or change the foam block in any way.  MAKE SURE YOU:  Understand these instructions.  Get help right away if you are not doing well or get worse.    Thank you for letting us be a part of your medical care team.  It is a privilege we respect greatly.  We hope these instructions will help you stay on track for a fast and full recovery!   Increase activity slowly as tolerated   Complete by:  As directed    Increase activity slowly as tolerated   Complete by:  As directed      Allergies as of 02/14/2018      Reactions   Adhesive [tape]    blisters   Aminoquinolines Other (See Comments)   Unknown   Azithromycin Other (See Comments)   Unknown   Bactrim [sulfamethoxazole-trimethoprim] Other (See Comments)   Unknown   Clarithromycin Other (See Comments)   UTI   Naproxen Sodium Other (See Comments)   Acid  reflux   Other Swelling   Monocryl stitch 5-0 Plain Catgut suture   Vicodin [hydrocodone-acetaminophen] Nausea And Vomiting      Medication List    STOP taking these medications   DUEXIS 800-26.6 MG Tabs Generic drug:  Ibuprofen-Famotidine   ondansetron 4 MG tablet Commonly known as:  ZOFRAN   phentermine 37.5 MG tablet Commonly known as:  ADIPEX-P   ranitidine 150 MG tablet Commonly known as:  ZANTAC     TAKE these medications   ALPRAZolam 0.25 MG tablet Commonly known as:  XANAX Take 0.25 mg by mouth at bedtime as needed for anxiety.   aspirin EC 325 MG tablet Take 1 tablet (325 mg total) by mouth 2 (two) times daily.   cetirizine-pseudoephedrine 5-120 MG tablet Commonly known as:  ZYRTEC-D Take 1 tablet by mouth at bedtime.   fluticasone 50 MCG/ACT nasal spray Commonly known as:  FLONASE Place 1 spray into both nostrils daily as needed for allergies.   levothyroxine 88 MCG tablet Commonly known as:  SYNTHROID, LEVOTHROID Take 88 mcg by mouth daily before breakfast.   methocarbamol 500 MG tablet Commonly known as:  ROBAXIN Take 1 tablet (500 mg total) by mouth every  8 (eight) hours as needed for muscle spasms.   MULTIVITAMIN PO Take 3 tablets by mouth daily.   omeprazole 40 MG capsule Commonly known as:  PRILOSEC Take 40 mg by mouth daily.   valACYclovir 1000 MG tablet Commonly known as:  VALTREX Take 1,000 mg by mouth 2 (two) times daily as needed (for fever blisters).   Vitamin D3 50 MCG (2000 UT) Chew Chew 4,000 Units by mouth daily.     ASK your doctor about these medications   oxyCODONE 5 MG immediate release tablet Commonly known as:  Oxy IR/ROXICODONE Take 1 tablet (5 mg total) by mouth every 4 (four) hours as needed for up to 7 days for severe pain. Ask about: Should I take this medication?      Follow-up Information    [facility] Follow up.   Specialty:  [place] Why:  [facility] will call  to arrange appt Contact information: [place] [address] [phone]        Sydnee Cabal, MD. Schedule an appointment as soon as possible for a visit in 2 week(s).   Specialty:  Orthopedic Surgery Contact information: [address] [place] [address] [phone]           Signed: Lajean Manes 02/23/2018, 4:04 PM

## 2018-08-18 ENCOUNTER — Other Ambulatory Visit: Payer: Self-pay

## 2018-08-19 ENCOUNTER — Encounter: Payer: Self-pay | Admitting: Obstetrics & Gynecology

## 2018-08-19 ENCOUNTER — Ambulatory Visit: Payer: BLUE CROSS/BLUE SHIELD | Admitting: Obstetrics & Gynecology

## 2018-08-19 VITALS — BP 140/80 | Ht 63.25 in | Wt 203.0 lb

## 2018-08-19 DIAGNOSIS — Z01419 Encounter for gynecological examination (general) (routine) without abnormal findings: Secondary | ICD-10-CM | POA: Diagnosis not present

## 2018-08-19 DIAGNOSIS — Z780 Asymptomatic menopausal state: Secondary | ICD-10-CM

## 2018-08-19 DIAGNOSIS — E6609 Other obesity due to excess calories: Secondary | ICD-10-CM

## 2018-08-19 DIAGNOSIS — Z90710 Acquired absence of both cervix and uterus: Secondary | ICD-10-CM | POA: Diagnosis not present

## 2018-08-19 DIAGNOSIS — N952 Postmenopausal atrophic vaginitis: Secondary | ICD-10-CM | POA: Diagnosis not present

## 2018-08-19 DIAGNOSIS — Z90722 Acquired absence of ovaries, bilateral: Secondary | ICD-10-CM

## 2018-08-19 DIAGNOSIS — M8588 Other specified disorders of bone density and structure, other site: Secondary | ICD-10-CM

## 2018-08-19 DIAGNOSIS — Z6835 Body mass index (BMI) 35.0-35.9, adult: Secondary | ICD-10-CM

## 2018-08-19 DIAGNOSIS — Z9079 Acquired absence of other genital organ(s): Secondary | ICD-10-CM

## 2018-08-19 MED ORDER — ESTROGENS, CONJUGATED 0.625 MG/GM VA CREA: 0.2500 | TOPICAL_CREAM | VAGINAL | 4 refills | Status: DC

## 2018-08-19 NOTE — Progress Notes (Signed)
Sandra Roy June 06, 1955 [id number]   History:    63 y.o. [id number] Married  RP:  Established patient presenting for annual gyn exam   HPI: Status post TAH/BSO in 2002 pathology benign.  Menopause, well on no hormone replacement therapy.  No pelvic pain.  No pain with intercourse using Premarin cream twice a week.  Urine and bowel movements normal.  Breasts normal.  Body mass index 35.68.  Not exercising regularly.  Health labs with family physician.  Past medical history,surgical history, family history and social history were all reviewed and documented in the EPIC chart.  Gynecologic History No LMP recorded. Patient has had a hysterectomy. Contraception: status post hysterectomy Last Pap: 2011. Results were: Normal Last mammogram: 2019. Results were: normal Bone Density: 08/2016 Osteopenia at Spine Colonoscopy: 2018 per patient  Obstetric History OB History  Gravida Para Term Preterm AB Living  4 3 3   1 3   SAB TAB Ectopic Multiple Live Births  1       3    # Outcome Date GA Lbr Len/2nd Weight Sex Delivery Anes PTL Lv  4 SAB           3 Term     F Vag-Spont  N LIV  2 Term     M Vag-Spont  N LIV  1 Term     F Vag-Spont  N LIV     ROS: A ROS was performed and pertinent positives and negatives are included in the history.  GENERAL: No fevers or chills. HEENT: No change in vision, no earache, sore throat or sinus congestion. NECK: No pain or stiffness. CARDIOVASCULAR: No chest pain or pressure. No palpitations. PULMONARY: No shortness of breath, cough or wheeze. GASTROINTESTINAL: No abdominal pain, nausea, vomiting or diarrhea, melena or bright red blood per rectum. GENITOURINARY: No urinary frequency, urgency, hesitancy or dysuria. MUSCULOSKELETAL: No joint or muscle pain, no back pain, no recent trauma. DERMATOLOGIC: No rash, no itching, no lesions. ENDOCRINE: No polyuria, polydipsia, no heat or cold intolerance. No recent change in weight. HEMATOLOGICAL: No anemia or  easy bruising or bleeding. NEUROLOGIC: No headache, seizures, numbness, tingling or weakness. PSYCHIATRIC: No depression, no loss of interest in normal activity or change in sleep pattern.     Exam:   BP 140/80   Ht 5' 3.25" (1.607 m)   Wt 203 lb (92.1 kg)   BMI 35.68 kg/m   Body mass index is 35.68 kg/m.  General appearance : Well developed well nourished female. No acute distress HEENT: Eyes: no retinal hemorrhage or exudates,  Neck supple, trachea midline, no carotid bruits, no thyroidmegaly Lungs: Clear to auscultation, no rhonchi or wheezes, or rib retractions  Heart: Regular rate and rhythm, no murmurs or gallops Breast:Examined in sitting and supine position were symmetrical in appearance, no palpable masses or tenderness,  no skin retraction, no nipple inversion, no nipple discharge, no skin discoloration, no axillary or supraclavicular lymphadenopathy Abdomen: no palpable masses or tenderness, no rebound or guarding Extremities: no edema or skin discoloration or tenderness  Pelvic: Vulva: Normal             Vagina: No gross lesions or discharge  Cervix/Uterus Absent  Adnexa  Without masses or tenderness  Anus: Normal   Assessment/Plan:  63 y.o. female for annual exam  1. Well female exam with routine gynecological exam Gynecologic exam status post TAH/BSO.  Pap test in 2011 was normal, no indication to repeat Pap tests.  Breast exam normal.  Patient  will schedule screening mammogram now.  We will follow-up with gastro for colonoscopy as needed, patient is on a 5-year basis because of benign polyps.  Health labs with family physician.  2. S/P TAH-BSO  3. Post-menopause Well on no hormone replacement therapy.  4. Post-menopausal atrophic vaginitis Premarin cream represcribed.  No contraindication to local estrogen.  Usage known.  Prescription sent to pharmacy.  5. Osteopenia of lumbar spine Osteopenia at the lumbar spine in May 2018.  Schedule repeat bone density  now here.  Vitamin D supplements, calcium intake of 1200 to 1500 mg daily, weightbearing physical activities on a regular basis recommended. - DG Bone Density; Future  6. Class 2 obesity due to excess calories without serious comorbidity with body mass index (BMI) of 35.0 to 35.9 in adult Recommend a lower calorie/carb diet such as [employer] and regular aerobic physical activities 5 times a week with weightlifting every 2 days.  Other orders - conjugated estrogens (PREMARIN) vaginal cream; Place 9.98 Applicatorfuls vaginally 2 (two) times a week.  Princess Bruins MD, 11:25 AM 08/19/2018

## 2018-08-20 ENCOUNTER — Encounter: Payer: Self-pay | Admitting: Obstetrics & Gynecology

## 2018-08-20 NOTE — Patient Instructions (Signed)
1. Well female exam with routine gynecological exam Gynecologic exam status post TAH/BSO.  Pap test in 2011 was normal, no indication to repeat Pap tests.  Breast exam normal.  Patient will schedule screening mammogram now.  We will follow-up with gastro for colonoscopy as needed, patient is on a 5-year basis because of benign polyps.  Health labs with family physician.  2. S/P TAH-BSO  3. Post-menopause Well on no hormone replacement therapy.  4. Post-menopausal atrophic vaginitis Premarin cream represcribed.  No contraindication to local estrogen.  Usage known.  Prescription sent to pharmacy.  5. Osteopenia of lumbar spine Osteopenia at the lumbar spine in May 2018.  Schedule repeat bone density now here.  Vitamin D supplements, calcium intake of 1200 to 1500 mg daily, weightbearing physical activities on a regular basis recommended. - DG Bone Density; Future  6. Class 2 obesity due to excess calories without serious comorbidity with body mass index (BMI) of 35.0 to 35.9 in adult Recommend a lower calorie/carb diet such as [employer] and regular aerobic physical activities 5 times a week with weightlifting every 2 days.  Other orders - conjugated estrogens (PREMARIN) vaginal cream; Place 8.26 Applicatorfuls vaginally 2 (two) times a week.  Sandra Roy, it was a pleasure seeing you today!

## 2019-03-09 DIAGNOSIS — M79672 Pain in left foot: Secondary | ICD-10-CM | POA: Diagnosis not present

## 2019-03-09 DIAGNOSIS — M722 Plantar fascial fibromatosis: Secondary | ICD-10-CM | POA: Diagnosis not present

## 2019-03-25 DIAGNOSIS — Z23 Encounter for immunization: Secondary | ICD-10-CM | POA: Diagnosis not present

## 2019-06-13 DIAGNOSIS — J0100 Acute maxillary sinusitis, unspecified: Secondary | ICD-10-CM | POA: Diagnosis not present

## 2019-06-13 DIAGNOSIS — J309 Allergic rhinitis, unspecified: Secondary | ICD-10-CM | POA: Diagnosis not present

## 2019-06-29 DIAGNOSIS — R7309 Other abnormal glucose: Secondary | ICD-10-CM | POA: Diagnosis not present

## 2019-06-29 DIAGNOSIS — E063 Autoimmune thyroiditis: Secondary | ICD-10-CM | POA: Diagnosis not present

## 2019-06-29 DIAGNOSIS — Z13220 Encounter for screening for lipoid disorders: Secondary | ICD-10-CM | POA: Diagnosis not present

## 2019-06-29 DIAGNOSIS — E559 Vitamin D deficiency, unspecified: Secondary | ICD-10-CM | POA: Diagnosis not present

## 2019-06-29 DIAGNOSIS — Z0001 Encounter for general adult medical examination with abnormal findings: Secondary | ICD-10-CM | POA: Diagnosis not present

## 2019-08-22 ENCOUNTER — Other Ambulatory Visit: Payer: Self-pay

## 2019-08-23 ENCOUNTER — Encounter: Payer: Self-pay | Admitting: Obstetrics & Gynecology

## 2019-08-23 ENCOUNTER — Ambulatory Visit (INDEPENDENT_AMBULATORY_CARE_PROVIDER_SITE_OTHER): Payer: Medicare Other | Admitting: Obstetrics & Gynecology

## 2019-08-23 VITALS — BP 132/88 | Ht 63.0 in | Wt 201.0 lb

## 2019-08-23 DIAGNOSIS — Z8619 Personal history of other infectious and parasitic diseases: Secondary | ICD-10-CM | POA: Diagnosis not present

## 2019-08-23 DIAGNOSIS — Z01419 Encounter for gynecological examination (general) (routine) without abnormal findings: Secondary | ICD-10-CM

## 2019-08-23 DIAGNOSIS — Z9079 Acquired absence of other genital organ(s): Secondary | ICD-10-CM

## 2019-08-23 DIAGNOSIS — M8588 Other specified disorders of bone density and structure, other site: Secondary | ICD-10-CM | POA: Diagnosis not present

## 2019-08-23 DIAGNOSIS — Z90710 Acquired absence of both cervix and uterus: Secondary | ICD-10-CM | POA: Diagnosis not present

## 2019-08-23 DIAGNOSIS — E6609 Other obesity due to excess calories: Secondary | ICD-10-CM

## 2019-08-23 DIAGNOSIS — Z780 Asymptomatic menopausal state: Secondary | ICD-10-CM

## 2019-08-23 DIAGNOSIS — Z90722 Acquired absence of ovaries, bilateral: Secondary | ICD-10-CM

## 2019-08-23 DIAGNOSIS — Z6835 Body mass index (BMI) 35.0-35.9, adult: Secondary | ICD-10-CM

## 2019-08-23 MED ORDER — VALACYCLOVIR HCL 1 G PO TABS: 1000.0000 mg | ORAL_TABLET | Freq: Every day | ORAL | 2 refills | Status: AC

## 2019-08-23 NOTE — Progress Notes (Signed)
    Sandra Roy 01-Nov-1955 [id number]   History:    64 y.o. [id number] Married  RP:  Established patient presenting for annual gyn exam   HPI: Status post TAH/BSO in 2002 pathology benign.  Menopause, well on no hormone replacement therapy.  No pelvic pain.  Pain with intercourse x 1 last time, not using lubricant.  Urine and bowel movements normal.  Breasts normal.  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  Body mass index 35.61.  Not exercising regularly.  Had a Rt knee replacement 1 yr ago.  Health labs with family physician.  Colono every 3 yrs, benign polyps.   Past medical history,surgical history, family history and social history were all reviewed and documented in the EPIC chart.  Gynecologic History No LMP recorded. Patient has had a hysterectomy.  Obstetric History OB History  Gravida Para Term Preterm AB Living  4 3 3   1 3   SAB TAB Ectopic Multiple Live Births  1       3    # Outcome Date GA Lbr Len/2nd Weight Sex Delivery Anes PTL Lv  4 SAB           3 Term     F Vag-Spont  N LIV  2 Term     M Vag-Spont  N LIV  1 Term     F Vag-Spont  N LIV     ROS: A ROS was performed and pertinent positives and negatives are included in the history.  GENERAL: No fevers or chills. HEENT: No change in vision, no earache, sore throat or sinus congestion. NECK: No pain or stiffness. CARDIOVASCULAR: No chest pain or pressure. No palpitations. PULMONARY: No shortness of breath, cough or wheeze. GASTROINTESTINAL: No abdominal pain, nausea, vomiting or diarrhea, melena or bright red blood per rectum. GENITOURINARY: No urinary frequency, urgency, hesitancy or dysuria. MUSCULOSKELETAL: No joint or muscle pain, no back pain, no recent trauma. DERMATOLOGIC: No rash, no itching, no lesions. ENDOCRINE: No polyuria, polydipsia, no heat or cold intolerance. No recent change in weight. HEMATOLOGICAL: No anemia or easy bruising or bleeding. NEUROLOGIC: No headache, seizures, numbness, tingling or  weakness. PSYCHIATRIC: No depression, no loss of interest in normal activity or change in sleep pattern.     Exam:   BP 132/88   Ht 5\' 3"  (1.6 m)   Wt 201 lb (91.2 kg)   BMI 35.61 kg/m   Body mass index is 35.61 kg/m.  General appearance : Well developed well nourished female. No acute distress HEENT: Eyes: no retinal hemorrhage or exudates,  Neck supple, trachea midline, no carotid bruits, no thyroidmegaly Lungs: Clear to auscultation, no rhonchi or wheezes, or rib retractions  Heart: Regular rate and rhythm, no murmurs or gallops Breast:Examined in sitting and supine position were symmetrical in appearance, no palpable masses or tenderness,  no skin retraction, no nipple inversion, no nipple discharge, no skin discoloration, no axillary or supraclavicular lymphadenopathy Abdomen: no palpable masses or tenderness, no rebound or guarding Extremities: no edema or skin discoloration or tenderness  Pelvic: Vulva: Normal             Vagina: No gross lesions or discharge  Cervix/Uterus absent  Adnexa  Without masses or tenderness  Anus: Normal   Assessment/Plan:  65 y.o. female for annual exam   Princess Bruins MD, 11:15 AM 08/23/2019

## 2019-08-23 NOTE — Patient Instructions (Signed)
1. Well female exam with routine gynecological exam Gynecologic exam status post TAH/BSO in menopause.  No indication to repeat a Pap test this year.  Breast exam normal.  Will obtain last screening mammogram.  Colonoscopies every 3 years we will also obtain the last report.  Health labs with family physician.  2. S/P TAH-BSO  3. Post-menopause Well on no hormone replacement therapy.  Will use coconut oil for intercourse.  4. Osteopenia of lumbar spine Osteopenia at the lumbar spine in 2018.  Schedule a repeat bone density here now.  Recommend vitamin D supplements, calcium intake of 1200 mg daily and regular weightbearing physical activities. - DG Bone Density; Future  5. H/O herpes labialis Valacyclovir treatment for herpes labialis recurrences prescribed.  6. Class 2 obesity due to excess calories without serious comorbidity with body mass index (BMI) of 35.0 to 35.9 in adult Recommend a lower calorie/carb diet such as [employer].  Aerobic activities 5 times a week and light weightlifting every 2 days.  Other orders - valACYclovir (VALTREX) 1000 MG tablet; Take 1 tablet (1,000 mg total) by mouth daily for 5 days.  Margaretha Sheffield, it was a pleasure seeing you today!

## 2019-09-22 DIAGNOSIS — H524 Presbyopia: Secondary | ICD-10-CM | POA: Diagnosis not present

## 2019-09-22 DIAGNOSIS — H2513 Age-related nuclear cataract, bilateral: Secondary | ICD-10-CM | POA: Diagnosis not present

## 2019-09-26 ENCOUNTER — Encounter: Payer: Self-pay | Admitting: Obstetrics & Gynecology

## 2019-09-26 DIAGNOSIS — Z1231 Encounter for screening mammogram for malignant neoplasm of breast: Secondary | ICD-10-CM | POA: Diagnosis not present

## 2019-11-04 DIAGNOSIS — M25521 Pain in right elbow: Secondary | ICD-10-CM | POA: Diagnosis not present

## 2019-11-04 DIAGNOSIS — M25522 Pain in left elbow: Secondary | ICD-10-CM | POA: Diagnosis not present

## 2019-11-04 DIAGNOSIS — M2550 Pain in unspecified joint: Secondary | ICD-10-CM | POA: Diagnosis not present

## 2019-12-05 DIAGNOSIS — N342 Other urethritis: Secondary | ICD-10-CM | POA: Diagnosis not present

## 2019-12-05 DIAGNOSIS — R7309 Other abnormal glucose: Secondary | ICD-10-CM | POA: Diagnosis not present

## 2019-12-05 DIAGNOSIS — R3915 Urgency of urination: Secondary | ICD-10-CM | POA: Diagnosis not present

## 2019-12-05 DIAGNOSIS — E063 Autoimmune thyroiditis: Secondary | ICD-10-CM | POA: Diagnosis not present

## 2019-12-14 DIAGNOSIS — M25521 Pain in right elbow: Secondary | ICD-10-CM | POA: Diagnosis not present

## 2019-12-14 DIAGNOSIS — M25522 Pain in left elbow: Secondary | ICD-10-CM | POA: Diagnosis not present

## 2020-04-11 DIAGNOSIS — J019 Acute sinusitis, unspecified: Secondary | ICD-10-CM | POA: Diagnosis not present

## 2020-08-08 DIAGNOSIS — Z1389 Encounter for screening for other disorder: Secondary | ICD-10-CM | POA: Diagnosis not present

## 2020-08-08 DIAGNOSIS — M7541 Impingement syndrome of right shoulder: Secondary | ICD-10-CM | POA: Diagnosis not present

## 2020-08-08 DIAGNOSIS — M7551 Bursitis of right shoulder: Secondary | ICD-10-CM | POA: Diagnosis not present

## 2020-08-08 DIAGNOSIS — M7552 Bursitis of left shoulder: Secondary | ICD-10-CM | POA: Diagnosis not present

## 2020-08-08 DIAGNOSIS — E039 Hypothyroidism, unspecified: Secondary | ICD-10-CM | POA: Diagnosis not present

## 2020-08-08 DIAGNOSIS — Z0000 Encounter for general adult medical examination without abnormal findings: Secondary | ICD-10-CM | POA: Diagnosis not present

## 2020-08-08 DIAGNOSIS — R7309 Other abnormal glucose: Secondary | ICD-10-CM | POA: Diagnosis not present

## 2020-08-23 ENCOUNTER — Encounter: Payer: Medicare Other | Admitting: Obstetrics & Gynecology

## 2020-10-01 DIAGNOSIS — H6991 Unspecified Eustachian tube disorder, right ear: Secondary | ICD-10-CM | POA: Diagnosis not present

## 2020-10-01 DIAGNOSIS — S80212A Abrasion, left knee, initial encounter: Secondary | ICD-10-CM | POA: Diagnosis not present

## 2020-10-25 ENCOUNTER — Other Ambulatory Visit: Payer: Self-pay

## 2020-10-25 ENCOUNTER — Other Ambulatory Visit: Payer: Self-pay | Admitting: Physician Assistant

## 2020-10-25 ENCOUNTER — Ambulatory Visit (HOSPITAL_COMMUNITY)
Admission: RE | Admit: 2020-10-25 | Discharge: 2020-10-25 | Disposition: A | Discharge status: Home / Self Care | Payer: Medicare Other | Source: Ambulatory Visit | Attending: Physician Assistant | Admitting: Physician Assistant

## 2020-10-25 DIAGNOSIS — M79662 Pain in left lower leg: Secondary | ICD-10-CM | POA: Diagnosis not present

## 2020-10-25 DIAGNOSIS — D6851 Activated protein C resistance: Secondary | ICD-10-CM | POA: Diagnosis not present

## 2020-10-25 DIAGNOSIS — I82812 Embolism and thrombosis of superficial veins of left lower extremities: Secondary | ICD-10-CM | POA: Diagnosis not present

## 2020-10-25 DIAGNOSIS — H6991 Unspecified Eustachian tube disorder, right ear: Secondary | ICD-10-CM | POA: Diagnosis not present

## 2020-10-30 ENCOUNTER — Other Ambulatory Visit: Payer: Self-pay | Admitting: Family Medicine

## 2020-10-30 DIAGNOSIS — Z139 Encounter for screening, unspecified: Secondary | ICD-10-CM

## 2020-11-01 ENCOUNTER — Other Ambulatory Visit: Payer: Self-pay

## 2020-11-01 ENCOUNTER — Ambulatory Visit
Admission: RE | Admit: 2020-11-01 | Discharge: 2020-11-01 | Disposition: A | Discharge status: Home / Self Care | Payer: Medicare Other | Source: Ambulatory Visit | Attending: Family Medicine | Admitting: Family Medicine

## 2020-11-01 DIAGNOSIS — Z139 Encounter for screening, unspecified: Secondary | ICD-10-CM

## 2020-11-01 DIAGNOSIS — Z1231 Encounter for screening mammogram for malignant neoplasm of breast: Secondary | ICD-10-CM | POA: Diagnosis not present

## 2020-11-06 ENCOUNTER — Encounter: Payer: Self-pay | Admitting: Vascular Surgery

## 2020-11-06 ENCOUNTER — Ambulatory Visit: Payer: Medicare Other | Admitting: Vascular Surgery

## 2020-11-06 ENCOUNTER — Other Ambulatory Visit: Payer: Self-pay

## 2020-11-06 DIAGNOSIS — I82890 Acute embolism and thrombosis of other specified veins: Secondary | ICD-10-CM | POA: Insufficient documentation

## 2020-11-06 MED ORDER — FONDAPARINUX SODIUM 2.5 MG/0.5ML ~~LOC~~ SOLN: 2.5000 mg | SUBCUTANEOUS | 0 refills | Status: DC

## 2020-11-06 NOTE — Progress Notes (Signed)
Patient name: Sandra Roy MRN: [id number] DOB: 1955/11/17 Sex: female  REASON FOR CONSULT: Long segment superficial vein thrombosis  HPI: Sandra Roy is a 65 y.o. female, with history of factor V Leiden heterozygote that presents for evaluation of long segment acute SVT in the left great saphenous vein.  Patient states she noticed symptoms in July after she was diagnosed with COVID.  She has had no previous thromboembolic events.  She thinks her mom did have a history of a DVT in the past.  She is otherwise very ambulatory and active.  She went to urgent care and was told to take a 325 aspirin daily and has had some symptom improvement.  Has some obvious varicose veins of her lower extremity.  Past Medical History:  Diagnosis Date   Adnexal mass 05/2000   Left   Anemia    history of   Anxiety    Autoimmune thyroiditis    Carpal tunnel syndrome    Right   Depression    Factor V Leiden mutation ([place])    Carrier   Foot fracture, left 05/06/2017   Hashimoto's disease    Hemorrhoids 04/25/2004   Mild external and internal noted on colonoscopy   History of colon polyps 04/25/2004   History of gastritis 04/25/2004   History of hiatal hernia 06/21/2002   History of menorrhagia    Hypoglycemic syndrome    Hypothyroidism    Lipoma    Bilateral ankles   NSVD (normal spontaneous vaginal delivery)    x3   OA (osteoarthritis)    Osteopenia    Osteopenia    Mild   PONV (postoperative nausea and vomiting)    Hysterectomy surgery blood pressure dropped   Thyroid nodule 03/23/2013   Bilateral 67m , noted on [place]  Vasovagal syncope    salty drinks help (gatorade)   Vitamin D deficiency     Past Surgical History:  Procedure Laterality Date   ABDOMINAL HYSTERECTOMY  2003   TAH/BSO   ANKLE SURGERY Bilateral 09/18/2017   excision lipomas    BLEPHAROPLASTY Bilateral    CARPAL TUNNEL RELEASE Right 2003   COLONOSCOPY     DILATION AND CURETTAGE OF UTERUS      ENDOMETRIAL ABLATION     KNEE ARTHROSCOPY  2010   RIGHT   LIPOMA EXCISION Bilateral    2019   PELVIC LAPAROSCOPY     LSO   TOTAL KNEE ARTHROPLASTY Right 02/12/2018   Procedure: RIGHT TOTAL KNEE ARTHROPLASTY;  Surgeon: CSydnee Cabal MD;  Location: [facility];  Service: Orthopedics;  Laterality: Right;   TUBAL LIGATION  1976   UPPER GI ENDOSCOPY      Family History  Problem Relation Age of Onset   Diabetes Father    Heart disease Father    Hypertension Sister    Cancer Sister        LUNG  (SMOKER)   Diabetes Brother    Hypertension Brother    Breast cancer Neg Hx     SOCIAL HISTORY: Social History   Socioeconomic History   Marital status: Married    Spouse name: Not on file   Number of children: Not on file   Years of education: Not on file   Highest education level: Not on file  Occupational History   Not on file  Tobacco Use   Smoking status: Former    Types: Cigarettes    Quit date: 06/17/1981    Years since quitting: 39.4   Smokeless  tobacco: Never  Vaping Use   Vaping Use: Never used  Substance and Sexual Activity   Alcohol use: No    Alcohol/week: 0.0 standard drinks   Drug use: No   Sexual activity: Yes    Partners: Male    Birth control/protection: Surgical    Comment: 1st intercourse- 15, partners- 2, married- 66 yrs   Other Topics Concern   Not on file  Social History Narrative   Not on file   Social Determinants of Health   Financial Resource Strain: Not on file  Food Insecurity: Not on file  Transportation Needs: Not on file  Physical Activity: Not on file  Stress: Not on file  Social Connections: Not on file  Intimate Partner Violence: Not on file    Allergies  Allergen Reactions   Adhesive [Tape]     blisters   Aminoquinolines Other (See Comments)    Unknown   Azithromycin Other (See Comments)    Unknown   Bactrim [Sulfamethoxazole-Trimethoprim] Other (See Comments)    Unknown   Clarithromycin Other (See Comments)    UTI    Naproxen Sodium Other (See Comments)    Acid reflux   Other Swelling    Monocryl stitch 5-0 Plain Catgut suture   Vicodin [Hydrocodone-Acetaminophen] Nausea And Vomiting    Current Outpatient Medications  Medication Sig Dispense Refill   ALPRAZolam (XANAX) 0.25 MG tablet Take 0.25 mg by mouth at bedtime as needed for anxiety.     Cholecalciferol (VITAMIN D3) 2000 units CHEW Chew 4,000 Units by mouth daily.     diclofenac (VOLTAREN) 75 MG EC tablet Take 75 mg by mouth 2 (two) times daily.     fluticasone (FLONASE) 50 MCG/ACT nasal spray Place 1 spray into both nostrils daily as needed for allergies.      levothyroxine (SYNTHROID, LEVOTHROID) 88 MCG tablet Take 88 mcg by mouth daily before breakfast.     Multiple Vitamins-Minerals (MULTIVITAMIN PO) Take 3 tablets by mouth daily.     omeprazole (PRILOSEC) 40 MG capsule Take 40 mg by mouth daily.   4   No current facility-administered medications for this visit.    REVIEW OF SYSTEMS:  '[X]'$  denotes positive finding, '[ ]'$  denotes negative finding Cardiac  Comments:  Chest pain or chest pressure:    Shortness of breath upon exertion:    Short of breath when lying flat:    Irregular heart rhythm:        Vascular    Pain in calf, thigh, or hip brought on by ambulation:    Pain in feet at night that wakes you up from your sleep:     Blood clot in your veins:    Leg swelling:         Pulmonary    Oxygen at home:    Productive cough:     Wheezing:         Neurologic    Sudden weakness in arms or legs:     Sudden numbness in arms or legs:     Sudden onset of difficulty speaking or slurred speech:    Temporary loss of vision in one eye:     Problems with dizziness:         Gastrointestinal    Blood in stool:     Vomited blood:         Genitourinary    Burning when urinating:     Blood in urine:        Psychiatric    Major  depression:         Hematologic    Bleeding problems:    Problems with blood clotting too easily:         Skin    Rashes or ulcers:        Constitutional    Fever or chills:      PHYSICAL EXAM: Vitals:   11/06/20 0814  BP: 134/69  Pulse: 75  Resp: 16  Temp: (!) 96.8 F (36 C)  TempSrc: Temporal  SpO2: 96%  Weight: 202 lb (91.6 kg)  Height: '5\' 4"'$  (1.626 m)    GENERAL: The patient is a well-nourished female, in no acute distress. The vital signs are documented above. CARDIAC: There is a regular rate and rhythm.  VASCULAR:  Palpable radial pulses bilateral upper extremities Palpable femoral pulses bilateral Palpable DP pulses bilaterally Palpable left cord in the left leg over GSV PULMONARY: No respiratory distress. ABDOMEN: Soft and non-tender. MUSCULOSKELETAL: There are no major deformities or cyanosis. NEUROLOGIC: No focal weakness or paresthesias are detected. SKIN: There are no ulcers or rashes noted. PSYCHIATRIC: The patient has a normal affect.  DATA:   Ultrasound 10/25/2020 shows long segment acute SVT in the left great saphenous vein from the mid thigh to the ankle with no DVT.  Assessment/Plan:  65 year old female with history of factor V Leiden heterozygous the presents for evaluation of a long segment superficial vein thrombosis in the left great saphenous vein from the mid thigh to the ankle.  Discussed this is a superficial vein and is not the same as a deep venous thrombosis.  Discussed with her that we could start her on 2.5 mg of Arixtra subcutaneous daily for 45 days based on some previous evidence for long segment superficial vein thrombosis.  I have sent a prescription to her pharmacy.  I discussed the importance of leg elevation and compression.  She can follow-up with [place] as needed if she has any worsening symptoms.   Marty Heck, MD [facility] Office: [phone]

## 2021-02-07 DIAGNOSIS — H2513 Age-related nuclear cataract, bilateral: Secondary | ICD-10-CM | POA: Diagnosis not present

## 2021-02-07 DIAGNOSIS — H524 Presbyopia: Secondary | ICD-10-CM | POA: Diagnosis not present

## 2021-02-08 DIAGNOSIS — J019 Acute sinusitis, unspecified: Secondary | ICD-10-CM | POA: Diagnosis not present

## 2021-02-08 DIAGNOSIS — D6851 Activated protein C resistance: Secondary | ICD-10-CM | POA: Diagnosis not present

## 2021-03-28 DIAGNOSIS — E039 Hypothyroidism, unspecified: Secondary | ICD-10-CM | POA: Diagnosis not present

## 2021-03-28 DIAGNOSIS — Z13220 Encounter for screening for lipoid disorders: Secondary | ICD-10-CM | POA: Diagnosis not present

## 2021-03-28 DIAGNOSIS — J309 Allergic rhinitis, unspecified: Secondary | ICD-10-CM | POA: Diagnosis not present

## 2021-04-16 ENCOUNTER — Telehealth: Payer: Self-pay

## 2021-04-16 DIAGNOSIS — I82890 Acute embolism and thrombosis of other specified veins: Secondary | ICD-10-CM

## 2021-04-16 NOTE — Telephone Encounter (Signed)
Opened in error

## 2021-04-16 NOTE — Telephone Encounter (Signed)
Patient calls today to ask about flying on a plane s/p SVT of GSV in July 2022. She took 45 days of Atrixa. She is still having some trouble with her left leg and has not been elevating or using compression socks. Discussed with MD, patient may take 81 mg of ASA and should wear compression socks. Her flight is only 2 hours - advised patient of proper way to elevate and encouraged compression socks. She would like to follow up to evaluate her left leg again. Placed on schedule in a few weeks with LLE reflux and follow up.

## 2021-05-14 ENCOUNTER — Ambulatory Visit: Payer: Medicare Other | Admitting: Vascular Surgery

## 2021-05-14 ENCOUNTER — Other Ambulatory Visit: Payer: Self-pay

## 2021-05-14 ENCOUNTER — Ambulatory Visit (HOSPITAL_COMMUNITY)
Admission: RE | Admit: 2021-05-14 | Discharge: 2021-05-14 | Disposition: A | Discharge status: Home / Self Care | Payer: Medicare Other | Source: Ambulatory Visit | Attending: Vascular Surgery | Admitting: Vascular Surgery

## 2021-05-14 ENCOUNTER — Encounter: Payer: Self-pay | Admitting: Vascular Surgery

## 2021-05-14 VITALS — BP 145/88 | HR 80 | Temp 97.2°F | Resp 16 | Ht 64.0 in | Wt 198.0 lb

## 2021-05-14 DIAGNOSIS — I8393 Asymptomatic varicose veins of bilateral lower extremities: Secondary | ICD-10-CM

## 2021-05-14 DIAGNOSIS — I82890 Acute embolism and thrombosis of other specified veins: Secondary | ICD-10-CM

## 2021-05-14 NOTE — Progress Notes (Signed)
Patient name: Sandra Roy MRN: [id number] DOB: 12-06-1955 Sex: female  REASON FOR CONSULT: Left leg pain in the setting of remote long segment superficial vein thrombosis  HPI: Sandra Roy is a 66 y.o. female, with history of factor V Leiden heterozygote that presents for evaluation of pain in the left leg medially at the knee joint.  She states there is a focal area at the knee joint medially on the distal thigh that is always tender and uncomfortable particularly when she lays on her side.  She states this all started last year when she was diagnosed with SVT in left GSV.  She has also noticed multiple spider veins in her lower extremities.  She was previously seen 11/06/2020 with long segment acute SVT in the left great saphenous vein.  She was placed on Arixtra for 45 days.  Now taking aspirin  Past Medical History:  Diagnosis Date   Adnexal mass 05/2000   Left   Anemia    history of   Anxiety    Autoimmune thyroiditis    Carpal tunnel syndrome    Right   Depression    Factor V Leiden mutation ([place])    Carrier   Foot fracture, left 05/06/2017   Hashimoto's disease    Hemorrhoids 04/25/2004   Mild external and internal noted on colonoscopy   History of colon polyps 04/25/2004   History of gastritis 04/25/2004   History of hiatal hernia 06/21/2002   History of menorrhagia    Hypoglycemic syndrome    Hypothyroidism    Lipoma    Bilateral ankles   NSVD (normal spontaneous vaginal delivery)    x3   OA (osteoarthritis)    Osteopenia    Osteopenia    Mild   PONV (postoperative nausea and vomiting)    Hysterectomy surgery blood pressure dropped   Thyroid nodule 03/23/2013   Bilateral 57mm , noted on [place]   Vasovagal syncope    salty drinks help (gatorade)   Vitamin D deficiency     Past Surgical History:  Procedure Laterality Date   ABDOMINAL HYSTERECTOMY  2003   TAH/BSO   ANKLE SURGERY Bilateral 09/18/2017   excision lipomas    BLEPHAROPLASTY  Bilateral    CARPAL TUNNEL RELEASE Right 2003   COLONOSCOPY     DILATION AND CURETTAGE OF UTERUS     ENDOMETRIAL ABLATION     KNEE ARTHROSCOPY  2010   RIGHT   LIPOMA EXCISION Bilateral    2019   PELVIC LAPAROSCOPY     LSO   TOTAL KNEE ARTHROPLASTY Right 02/12/2018   Procedure: RIGHT TOTAL KNEE ARTHROPLASTY;  Surgeon: Sydnee Cabal, MD;  Location: [facility];  Service: Orthopedics;  Laterality: Right;   TUBAL LIGATION  1976   UPPER GI ENDOSCOPY      Family History  Problem Relation Age of Onset   Diabetes Father    Heart disease Father    Hypertension Sister    Cancer Sister        LUNG  (SMOKER)   Diabetes Brother    Hypertension Brother    Breast cancer Neg Hx     SOCIAL HISTORY: Social History   Socioeconomic History   Marital status: Married    Spouse name: Not on file   Number of children: Not on file   Years of education: Not on file   Highest education level: Not on file  Occupational History   Not on file  Tobacco Use   Smoking status: Former  Types: Cigarettes    Quit date: 06/17/1981    Years since quitting: 39.9   Smokeless tobacco: Never  Vaping Use   Vaping Use: Never used  Substance and Sexual Activity   Alcohol use: No    Alcohol/week: 0.0 standard drinks   Drug use: No   Sexual activity: Yes    Partners: Male    Birth control/protection: Surgical    Comment: 1st intercourse- 15, partners- 2, married- 2 yrs   Other Topics Concern   Not on file  Social History Narrative   Not on file   Social Determinants of Health   Financial Resource Strain: Not on file  Food Insecurity: Not on file  Transportation Needs: Not on file  Physical Activity: Not on file  Stress: Not on file  Social Connections: Not on file  Intimate Partner Violence: Not on file    Allergies  Allergen Reactions   Adhesive [Tape]     blisters   Aminoquinolines Other (See Comments)    Unknown   Azithromycin Other (See Comments)    Unknown   Bactrim  [Sulfamethoxazole-Trimethoprim] Other (See Comments)    Unknown   Clarithromycin Other (See Comments)    UTI   Naproxen Sodium Other (See Comments)    Acid reflux   Other Swelling    Monocryl stitch 5-0 Plain Catgut suture   Vicodin [Hydrocodone-Acetaminophen] Nausea And Vomiting    Current Outpatient Medications  Medication Sig Dispense Refill   ALPRAZolam (XANAX) 0.25 MG tablet Take 0.25 mg by mouth at bedtime as needed for anxiety.     Cholecalciferol (VITAMIN D3) 2000 units CHEW Chew 4,000 Units by mouth daily.     diclofenac (VOLTAREN) 75 MG EC tablet Take 75 mg by mouth 2 (two) times daily.     fluticasone (FLONASE) 50 MCG/ACT nasal spray Place 1 spray into both nostrils daily as needed for allergies.      levothyroxine (SYNTHROID, LEVOTHROID) 88 MCG tablet Take 88 mcg by mouth daily before breakfast.     Multiple Vitamins-Minerals (MULTIVITAMIN PO) Take 3 tablets by mouth daily.     omeprazole (PRILOSEC) 40 MG capsule Take 40 mg by mouth daily.   4   fondaparinux (ARIXTRA) 2.5 MG/0.5ML SOLN injection Inject 0.5 mLs (2.5 mg total) into the skin daily. 22.5 mL 0   No current facility-administered medications for this visit.    REVIEW OF SYSTEMS:  [X]  denotes positive finding, [ ]  denotes negative finding Cardiac  Comments:  Chest pain or chest pressure:    Shortness of breath upon exertion:    Short of breath when lying flat:    Irregular heart rhythm:        Vascular    Pain in calf, thigh, or hip brought on by ambulation:    Pain in feet at night that wakes you up from your sleep:     Blood clot in your veins:    Leg swelling:         Pulmonary    Oxygen at home:    Productive cough:     Wheezing:         Neurologic    Sudden weakness in arms or legs:     Sudden numbness in arms or legs:     Sudden onset of difficulty speaking or slurred speech:    Temporary loss of vision in one eye:     Problems with dizziness:         Gastrointestinal    Blood in stool:  Vomited blood:         Genitourinary    Burning when urinating:     Blood in urine:        Psychiatric    Major depression:         Hematologic    Bleeding problems:    Problems with blood clotting too easily:        Skin    Rashes or ulcers:        Constitutional    Fever or chills:      PHYSICAL EXAM: Vitals:   05/14/21 1407  BP: (!) 145/88  Pulse: 80  Resp: 16  Temp: (!) 97.2 F (36.2 C)  TempSrc: Temporal  SpO2: 93%  Weight: 198 lb (89.8 kg)  Height: 5\' 4"  (1.626 m)    GENERAL: The patient is a well-nourished female, in no acute distress. The vital signs are documented above. CARDIAC: There is a regular rate and rhythm.  VASCULAR:  Palpable DP pulses bilaterally Focal area over left GSV in distal thigh that is painful PULMONARY: No respiratory distress. ABDOMEN: Soft and non-tender. MUSCULOSKELETAL: There are no major deformities or cyanosis. NEUROLOGIC: No focal weakness or paresthesias are detected.   DATA:   Ultrasound today shows no evidence of deep venous thrombosis.  She has some residual thrombus in the left great saphenous vein where she had previous thrombosis last year with some evidence of recanalization.  Assessment/Plan:  66 year old female presents with focal area of discomfort near the left knee joint medially that is focally uncomfortable.  This is over a segment of GSV and she was treated for long segment SVT of the left great saphenous vein last year and completed 45 days of treatment with Arixtra.  I looked at this segment with ultrasound and appears to have some residual chronic thrombus.  I discussed the option of excising this in the operating room if she found it significantly uncomfortable versus conservative therapy.  She states that this is ultimately tolerable at this time and we recommend converting her to a thigh-high stocking (she is currently in knee high stockings).  She can follow with me as needed.  No evidence of new DVT or  other new acute clot today.   Marty Heck, MD [facility] Office: [phone]

## 2021-05-20 DIAGNOSIS — Z86010 Personal history of colonic polyps: Secondary | ICD-10-CM | POA: Diagnosis not present

## 2021-05-20 DIAGNOSIS — K219 Gastro-esophageal reflux disease without esophagitis: Secondary | ICD-10-CM | POA: Diagnosis not present

## 2021-05-20 DIAGNOSIS — E039 Hypothyroidism, unspecified: Secondary | ICD-10-CM | POA: Diagnosis not present

## 2021-05-29 DIAGNOSIS — J019 Acute sinusitis, unspecified: Secondary | ICD-10-CM | POA: Diagnosis not present

## 2021-05-29 DIAGNOSIS — J309 Allergic rhinitis, unspecified: Secondary | ICD-10-CM | POA: Diagnosis not present

## 2021-07-17 DIAGNOSIS — D485 Neoplasm of uncertain behavior of skin: Secondary | ICD-10-CM | POA: Diagnosis not present

## 2021-07-17 DIAGNOSIS — Z1283 Encounter for screening for malignant neoplasm of skin: Secondary | ICD-10-CM | POA: Diagnosis not present

## 2021-07-17 DIAGNOSIS — L578 Other skin changes due to chronic exposure to nonionizing radiation: Secondary | ICD-10-CM | POA: Diagnosis not present

## 2021-07-17 DIAGNOSIS — D225 Melanocytic nevi of trunk: Secondary | ICD-10-CM | POA: Diagnosis not present

## 2021-07-25 IMAGING — MG MM DIGITAL SCREENING BILAT W/ TOMO AND CAD
8 series · 8 of 24 positions shown · non-contrast
Comparison: Previous exam(s).

CLINICAL DATA: Screening.

EXAM:
DIGITAL SCREENING BILATERAL MAMMOGRAM WITH TOMOSYNTHESIS AND CAD
TECHNIQUE: Bilateral screening digital craniocaudal and mediolateral oblique
mammograms were obtained. Bilateral screening digital breast
tomosynthesis was performed. The images were evaluated with
computer-aided detection.

[L MLO synth-2D]
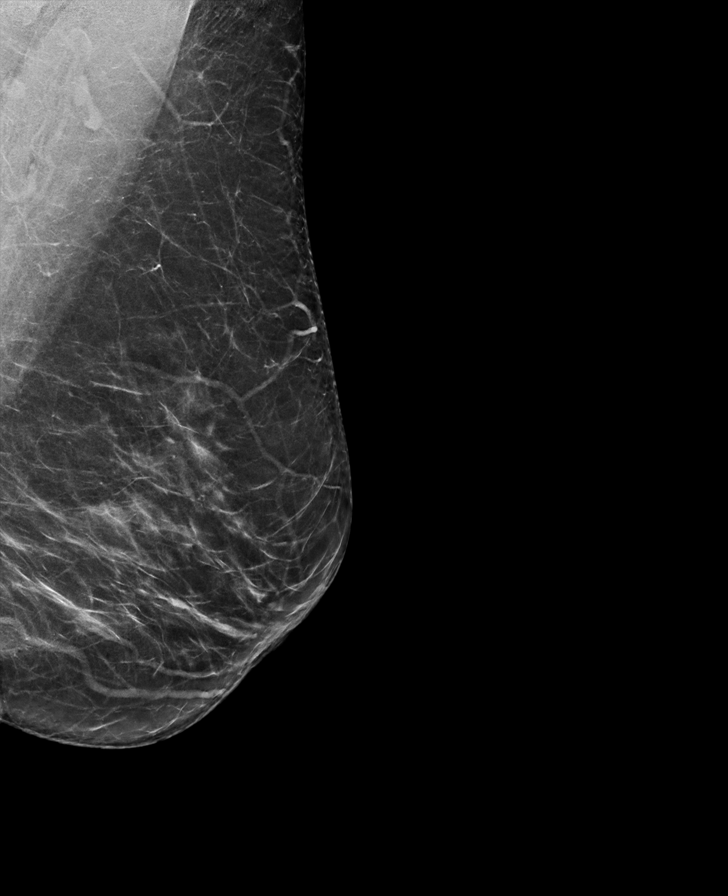

[L CC synth-2D]
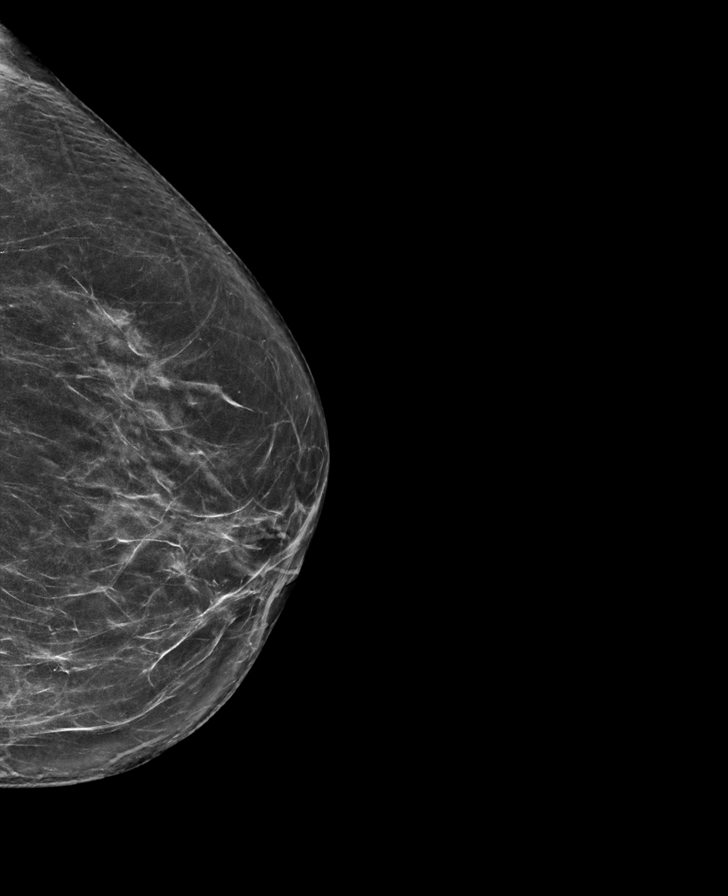

[R MLO synth-2D]
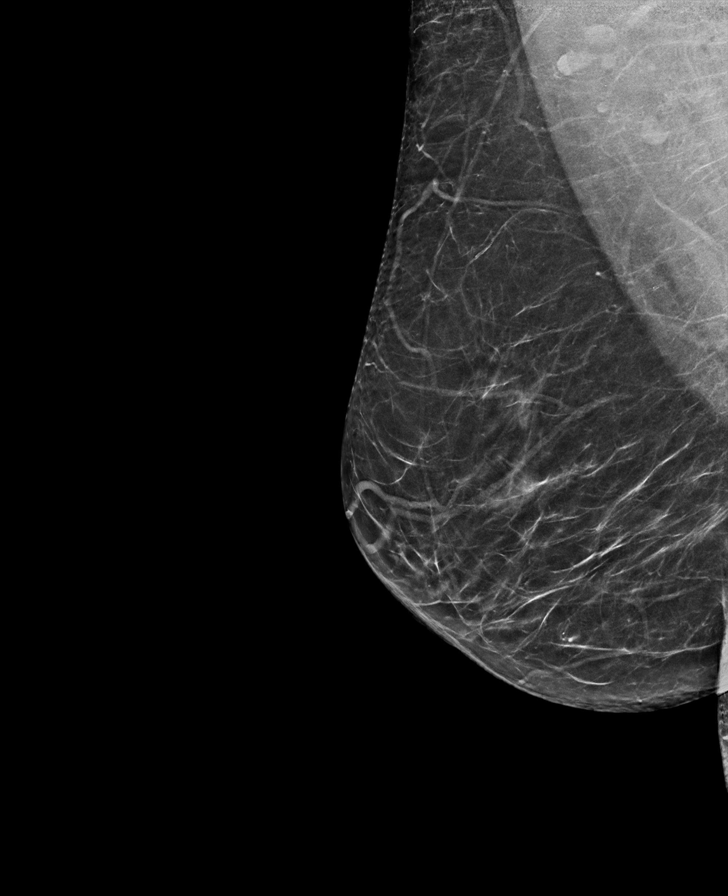

[R CC synth-2D]
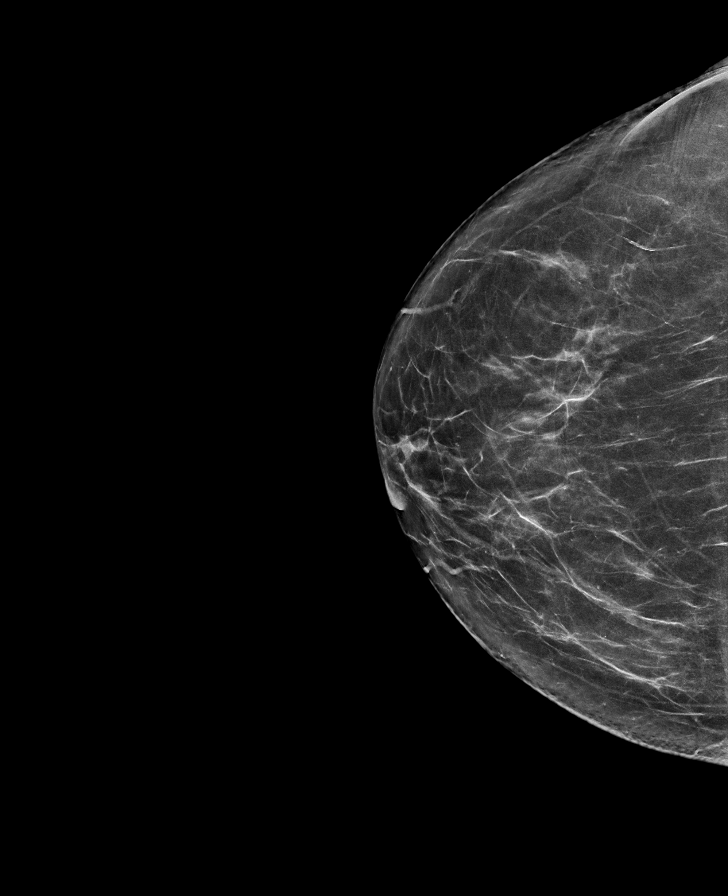

[R CC tomo · tomo slice 37/74.0]
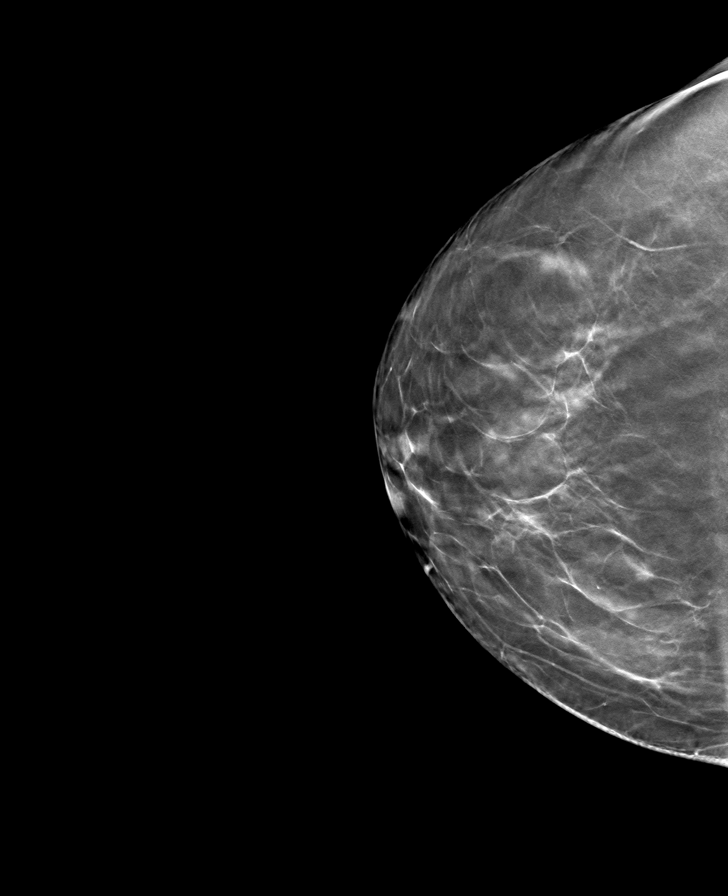

[R MLO tomo · tomo slice 37/73.0]
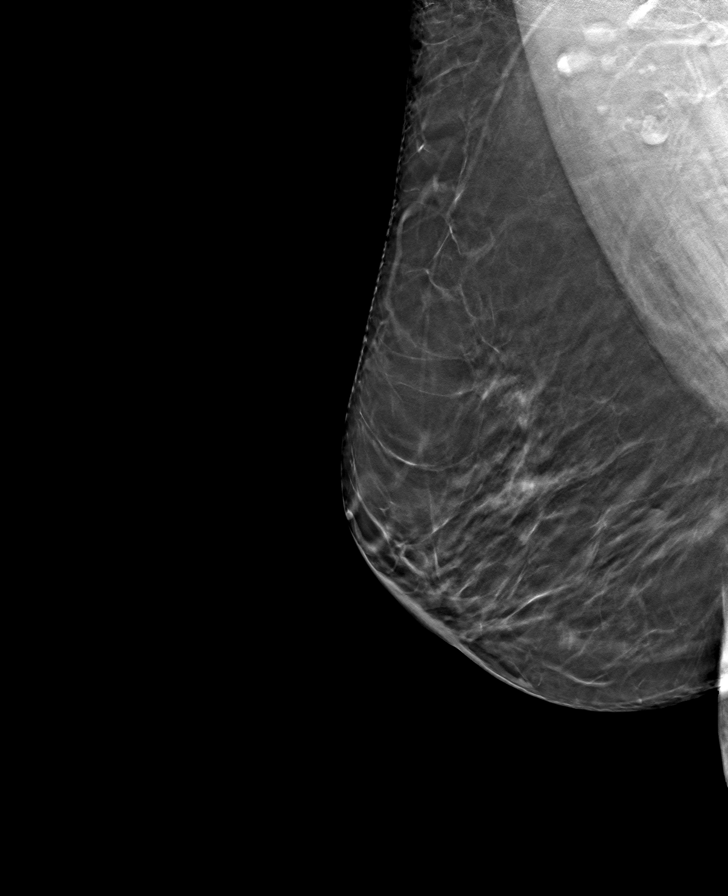

[L MLO tomo · tomo slice 39/78.0]
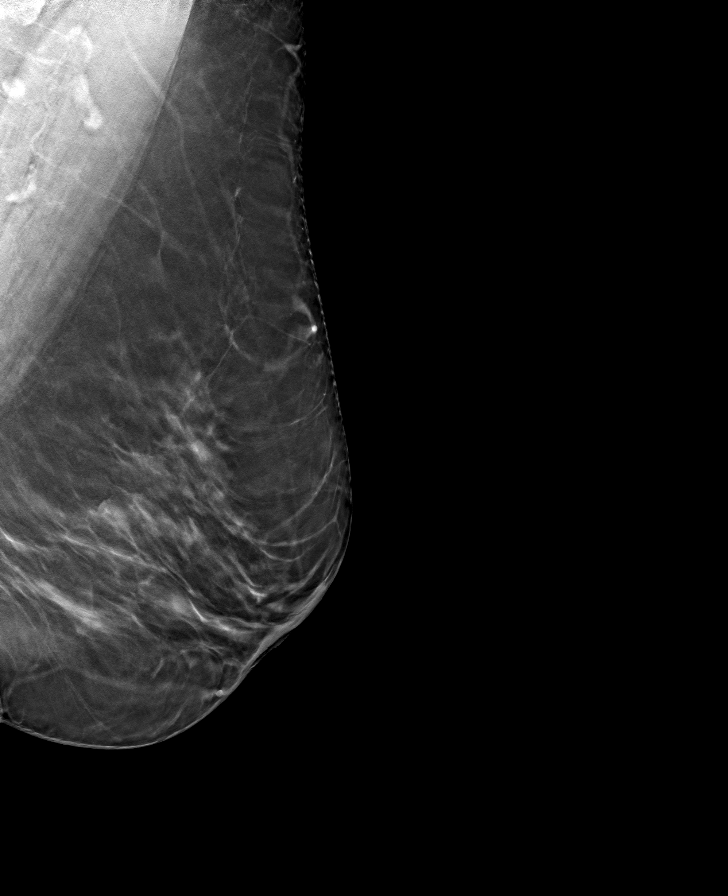

[L CC tomo · tomo slice 37/72.0]
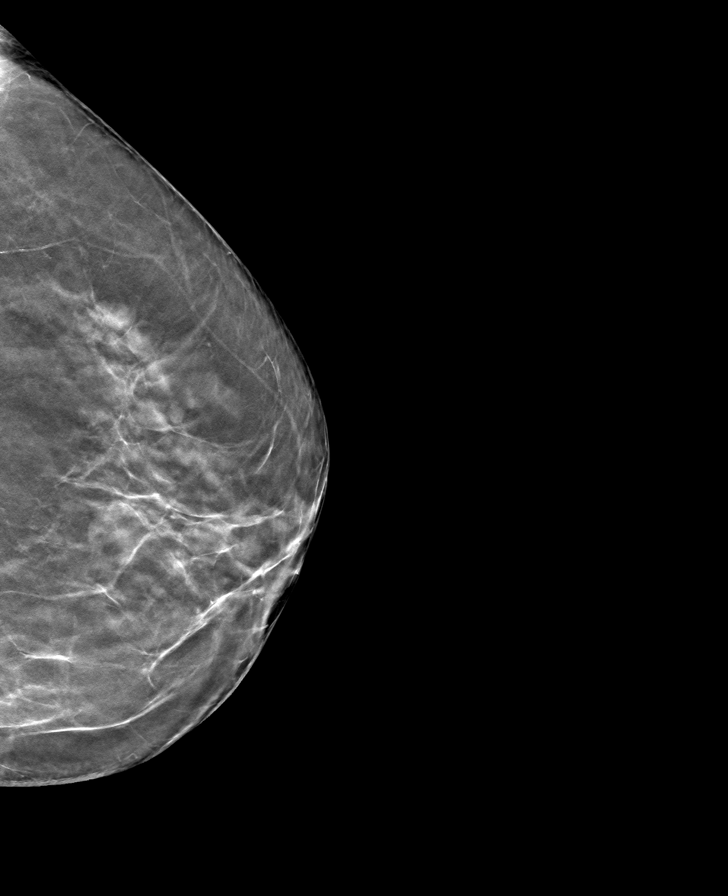

[8 of 24 positions shown; findings below may reference images not displayed]

ACR Breast Density Category b: There are scattered areas of
fibroglandular density.
FINDINGS: There are no findings suspicious for malignancy.
IMPRESSION: No mammographic evidence of malignancy. A result letter of this
screening mammogram will be mailed directly to the patient.

RECOMMENDATION:
Screening mammogram in one year. (Code:51-O-LD2)

BI-RADS CATEGORY  1: Negative.

## 2021-10-22 DIAGNOSIS — K5730 Diverticulosis of large intestine without perforation or abscess without bleeding: Secondary | ICD-10-CM | POA: Diagnosis not present

## 2021-10-22 DIAGNOSIS — Z86010 Personal history of colonic polyps: Secondary | ICD-10-CM | POA: Diagnosis not present

## 2021-12-03 ENCOUNTER — Ambulatory Visit (INDEPENDENT_AMBULATORY_CARE_PROVIDER_SITE_OTHER): Payer: Medicare Other | Admitting: Internal Medicine

## 2021-12-03 ENCOUNTER — Encounter: Payer: Self-pay | Admitting: Internal Medicine

## 2021-12-03 VITALS — BP 140/78 | HR 74 | Ht 64.0 in | Wt 201.4 lb

## 2021-12-03 DIAGNOSIS — E66811 Obesity, class 1: Secondary | ICD-10-CM

## 2021-12-03 DIAGNOSIS — Z0000 Encounter for general adult medical examination without abnormal findings: Secondary | ICD-10-CM | POA: Diagnosis not present

## 2021-12-03 DIAGNOSIS — R030 Elevated blood-pressure reading, without diagnosis of hypertension: Secondary | ICD-10-CM | POA: Diagnosis not present

## 2021-12-03 DIAGNOSIS — R7303 Prediabetes: Secondary | ICD-10-CM | POA: Diagnosis not present

## 2021-12-03 DIAGNOSIS — E038 Other specified hypothyroidism: Secondary | ICD-10-CM

## 2021-12-03 DIAGNOSIS — M8580 Other specified disorders of bone density and structure, unspecified site: Secondary | ICD-10-CM

## 2021-12-03 DIAGNOSIS — E669 Obesity, unspecified: Secondary | ICD-10-CM

## 2021-12-03 DIAGNOSIS — F419 Anxiety disorder, unspecified: Secondary | ICD-10-CM | POA: Diagnosis not present

## 2021-12-03 DIAGNOSIS — E063 Autoimmune thyroiditis: Secondary | ICD-10-CM

## 2021-12-03 DIAGNOSIS — E785 Hyperlipidemia, unspecified: Secondary | ICD-10-CM | POA: Diagnosis not present

## 2021-12-03 MED ORDER — ALPRAZOLAM 0.25 MG PO TABS: 0.2500 mg | ORAL_TABLET | Freq: Every evening | ORAL | 0 refills | Status: DC | PRN
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
## 2021-12-03 NOTE — Assessment & Plan Note (Signed)
She is currently prescribed Synthroid 88 mcg daily.  She states that she has been at the same dose for quite some time.  Asymptomatic aside from endorsing inability to lose weight, which could be related to hypothyroidism. -Repeat TSH today

## 2021-12-03 NOTE — Assessment & Plan Note (Signed)
She expresses frustration of an inability to lose weight today despite aggressive dieting and exercise measures.  She is interested in starting a medication for weight loss or any additional recommendations. -Checking baseline labs today to see if there are any metabolic causes to explain her inability to lose weight -We will plan for follow-up in 2 weeks to further discuss weight loss strategies and consider starting a medication such as GLP-1 therapy.
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
## 2021-12-03 NOTE — Assessment & Plan Note (Signed)
Presenting today to establish care.  We reviewed outstanding healthcare maintenance items. -Baseline labs ordered today -Referral placed for screening mammogram -She declined outstanding vaccinations today until we receive records from [facility]

## 2021-12-03 NOTE — Patient Instructions (Signed)
It was a pleasure to see you today.  Thank you for giving [place] the opportunity to be involved in your care.  Below is a brief recap of your visit and next steps.  We will plan to see you again in 2 weeks.  Summary We will get baseline labwork today I sent a refill for Xanax to your pharmacy  Next steps Plan for follow up in 2 weeks to check your blood pressure and to further discuss weight loss.

## 2021-12-03 NOTE — Assessment & Plan Note (Signed)
No prior history of hypertension.  BP 179/87 initially.  Improved 140/78 on repeat.  She is not regularly checking her blood pressure at home. -Plan for follow-up in 2 weeks for repeat BP

## 2021-12-04 ENCOUNTER — Telehealth: Payer: Self-pay | Admitting: Internal Medicine

## 2021-12-04 LAB — LIPID PANEL
Chol/HDL Ratio: 3.2 ratio (ref 0.0–4.4)
Cholesterol, Total: 183 mg/dL (ref 100–199)
HDL: 58 mg/dL (ref >39)
LDL Chol Calc (NIH): 111 mg/dL — ABNORMAL HIGH (ref 0–99)
Triglycerides: 78 mg/dL (ref 0–149)
VLDL Cholesterol Cal: 14 mg/dL (ref 5–40)

## 2021-12-04 LAB — CBC WITH DIFFERENTIAL/PLATELET
Basophils Absolute: 0 10*3/uL (ref 0.0–0.2)
Basos: 0 %
EOS (ABSOLUTE): 0.1 10*3/uL (ref 0.0–0.4)
Eos: 1 %
Hematocrit: 39.6 % (ref 34.0–46.6)
Hemoglobin: 13.1 g/dL (ref 11.1–15.9)
Immature Grans (Abs): 0 10*3/uL (ref 0.0–0.1)
Immature Granulocytes: 0 %
Lymphocytes Absolute: 2.1 10*3/uL (ref 0.7–3.1)
Lymphs: 36 %
MCH: 30.9 pg (ref 26.6–33.0)
MCHC: 33.1 g/dL (ref 31.5–35.7)
MCV: 93 fL (ref 79–97)
Monocytes Absolute: 0.4 10*3/uL (ref 0.1–0.9)
Monocytes: 8 %
Neutrophils Absolute: 3.2 10*3/uL (ref 1.4–7.0)
Neutrophils: 55 %
Platelets: 233 10*3/uL (ref 150–450)
RBC: 4.24 x10E6/uL (ref 3.77–5.28)
RDW: 13.1 % (ref 11.7–15.4)
WBC: 5.9 10*3/uL (ref 3.4–10.8)

## 2021-12-04 LAB — CMP14+EGFR
ALT: 19 IU/L (ref 0–32)
AST: 25 IU/L (ref 0–40)
Albumin/Globulin Ratio: 2.2 (ref 1.2–2.2)
Albumin: 4.6 g/dL (ref 3.9–4.9)
Alkaline Phosphatase: 102 IU/L (ref 44–121)
BUN/Creatinine Ratio: 17 (ref 12–28)
BUN: 11 mg/dL (ref 8–27)
Bilirubin Total: 0.3 mg/dL (ref 0.0–1.2)
CO2: 27 mmol/L (ref 20–29)
Calcium: 9.2 mg/dL (ref 8.7–10.3)
Chloride: 107 mmol/L — ABNORMAL HIGH (ref 96–106)
Creatinine, Ser: 0.63 mg/dL (ref 0.57–1.00)
Globulin, Total: 2.1 g/dL (ref 1.5–4.5)
Glucose: 80 mg/dL (ref 70–99)
Potassium: 4.2 mmol/L (ref 3.5–5.2)
Sodium: 145 mmol/L — ABNORMAL HIGH (ref 134–144)
Total Protein: 6.7 g/dL (ref 6.0–8.5)
eGFR: 97 mL/min/{1.73_m2} (ref >59)

## 2021-12-04 LAB — VITAMIN D 25 HYDROXY (VIT D DEFICIENCY, FRACTURES): Vit D, 25-Hydroxy: 46.3 ng/mL (ref 30.0–100.0)

## 2021-12-04 LAB — HEMOGLOBIN A1C
Est. average glucose Bld gHb Est-mCnc: 123 mg/dL
Hgb A1c MFr Bld: 5.9 % — ABNORMAL HIGH (ref 4.8–5.6)

## 2021-12-04 LAB — B12 AND FOLATE PANEL
Folate: 10.3 ng/mL (ref >3.0)
Vitamin B-12: 329 pg/mL (ref 232–1245)

## 2021-12-04 LAB — TSH+FREE T4
Free T4: 1.48 ng/dL (ref 0.82–1.77)
TSH: 1.4 u[IU]/mL (ref 0.450–4.500)

## 2021-12-04 NOTE — Telephone Encounter (Signed)
Please call patient about her lab results drawn yesterday.  She is going out of town today and would like to talk with you asap.  I told her you would not be in till 1pm today.

## 2021-12-04 NOTE — Telephone Encounter (Signed)
  Please call patient about her lab results drawn yesterday.  She is going out of town today and would like to talk with you asap.  I told her you would not be in till 1pm today.

## 2021-12-07 ENCOUNTER — Other Ambulatory Visit: Payer: Self-pay | Admitting: Internal Medicine

## 2021-12-09 MED ORDER — DICLOFENAC SODIUM 75 MG PO TBEC: 75.0000 mg | DELAYED_RELEASE_TABLET | Freq: Two times a day (BID) | ORAL | 3 refills | Status: DC

## 2021-12-09 MED ORDER — LEVOTHYROXINE SODIUM 88 MCG PO TABS: 88.0000 ug | ORAL_TABLET | Freq: Every day | ORAL | 3 refills | Status: DC

## 2021-12-09 MED ORDER — OMEPRAZOLE 40 MG PO CPDR: 40.0000 mg | DELAYED_RELEASE_CAPSULE | Freq: Every day | ORAL | 4 refills | Status: DC

## 2021-12-23 ENCOUNTER — Encounter: Payer: Self-pay | Admitting: Internal Medicine

## 2021-12-23 ENCOUNTER — Ambulatory Visit (INDEPENDENT_AMBULATORY_CARE_PROVIDER_SITE_OTHER): Payer: Medicare Other | Admitting: Internal Medicine

## 2021-12-23 ENCOUNTER — Ambulatory Visit (INDEPENDENT_AMBULATORY_CARE_PROVIDER_SITE_OTHER): Payer: Medicare Other

## 2021-12-23 DIAGNOSIS — U071 COVID-19: Secondary | ICD-10-CM | POA: Diagnosis not present

## 2021-12-23 DIAGNOSIS — Z0000 Encounter for general adult medical examination without abnormal findings: Secondary | ICD-10-CM

## 2021-12-23 MED ORDER — GUAIFENESIN-DM 100-10 MG/5ML PO SYRP: 5.0000 mL | ORAL_SOLUTION | ORAL | 0 refills | Status: DC | PRN

## 2021-12-23 MED ORDER — NIRMATRELVIR/RITONAVIR (PAXLOVID)TABLET: 3.0000 | ORAL_TABLET | Freq: Two times a day (BID) | ORAL | 0 refills | Status: AC

## 2021-12-23 NOTE — Progress Notes (Signed)
Subjective:   Sandra Roy is a 66 y.o. female who presents for Medicare Annual (Subsequent) preventive examination.  Review of Systems    I connected with  Sandra Roy on 12/23/21 by a audio enabled telemedicine application and verified that I am speaking with the correct person using two identifiers.  Patient Location: Home  Provider Location: Office/Clinic  I discussed the limitations of evaluation and management by telemedicine. The patient expressed understanding and agreed to proceed.  Cardiac Risk Factors include: none     Objective:    Today's Vitals   12/23/21 0832  PainSc: 4    There is no height or weight on file to calculate BMI.     12/23/2021    8:37 AM 02/20/2018    8:48 AM 02/05/2018   11:13 AM  Advanced Directives  Does Patient Have a Medical Advance Directive? Yes Yes Yes  Type of Industrial/product designer of [employer];Living will  Does patient want to make changes to medical advance directive? No - Patient declined  No - Patient declined  Copy of [place] in Chart? No - copy requested  No - copy requested    Current Medications (verified) Outpatient Encounter Medications as of 12/23/2021  Medication Sig   ALPRAZolam (XANAX) 0.25 MG tablet Take 1 tablet (0.25 mg total) by mouth at bedtime as needed for anxiety.   Cholecalciferol (VITAMIN D3) 2000 units CHEW Chew 4,000 Units by mouth daily.   diclofenac (VOLTAREN) 75 MG EC tablet Take 1 tablet (75 mg total) by mouth 2 (two) times daily.   fluticasone (FLONASE) 50 MCG/ACT nasal spray Place 1 spray into both nostrils daily as needed for allergies.    guaiFENesin-dextromethorphan (ROBITUSSIN DM) 100-10 MG/5ML syrup Take 5 mLs by mouth every 4 (four) hours as needed for cough.   levothyroxine (SYNTHROID) 88 MCG tablet Take 1 tablet (88 mcg total) by mouth daily before breakfast.   Melatonin 5 MG CHEW Chew 2  tablets by mouth at bedtime.   Multiple Vitamins-Minerals (MULTIVITAMIN PO) Take 3 tablets by mouth daily.   nirmatrelvir/ritonavir EUA (PAXLOVID) 20 x 150 MG & 10 x '100MG'$  TABS Take 3 tablets by mouth 2 (two) times daily for 5 days. (Take nirmatrelvir 150 mg two tablets twice daily for 5 days and ritonavir 100 mg one tablet twice daily for 5 days) Patient GFR is 97   omeprazole (PRILOSEC) 40 MG capsule Take 1 capsule (40 mg total) by mouth daily.   No facility-administered encounter medications on file as of 12/23/2021.    Allergies (verified) Adhesive [tape], Aminoquinolines, Azithromycin, Bactrim [sulfamethoxazole-trimethoprim], Clarithromycin, Naproxen sodium, Other, and Vicodin [hydrocodone-acetaminophen]   History: Past Medical History:  Diagnosis Date   Adnexal mass 05/2000   Left   Anemia    history of   Anxiety    Autoimmune thyroiditis    Carpal tunnel syndrome    Right   Depression    Factor V Leiden mutation ([place])    Carrier   Foot fracture, left 05/06/2017   Hashimoto's disease    Hemorrhoids 04/25/2004   Mild external and internal noted on colonoscopy   History of colon polyps 04/25/2004   History of gastritis 04/25/2004   History of hiatal hernia 06/21/2002   History of menorrhagia    Hypoglycemic syndrome    Hypothyroidism    Lipoma    Bilateral ankles   NSVD (normal spontaneous vaginal delivery)    x3  OA (osteoarthritis)    Osteopenia    Osteopenia    Mild   PONV (postoperative nausea and vomiting)    Hysterectomy surgery blood pressure dropped   Thyroid nodule 03/23/2013   Bilateral 28m , noted on [place]  Vasovagal syncope    salty drinks help (gatorade)   Vitamin D deficiency    Past Surgical History:  Procedure Laterality Date   ABDOMINAL HYSTERECTOMY  2003   TAH/BSO   ANKLE SURGERY Bilateral 09/18/2017   excision lipomas    BLEPHAROPLASTY Bilateral    CARPAL TUNNEL RELEASE Right 2003   COLONOSCOPY     DILATION AND CURETTAGE OF UTERUS      ENDOMETRIAL ABLATION     KNEE ARTHROSCOPY  2010   RIGHT   LIPOMA EXCISION Bilateral    2019   PELVIC LAPAROSCOPY     LSO   TOTAL KNEE ARTHROPLASTY Right 02/12/2018   Procedure: RIGHT TOTAL KNEE ARTHROPLASTY;  Surgeon: CSydnee Cabal MD;  Location: [facility];  Service: Orthopedics;  Laterality: Right;   TUBAL LIGATION  1976   UPPER GI ENDOSCOPY     Family History  Problem Relation Age of Onset   Diabetes Father    Heart disease Father    Hypertension Sister    Cancer Sister        LUNG  (SMOKER)   Diabetes Brother    Hypertension Brother    Breast cancer Neg Hx    Social History   Socioeconomic History   Marital status: Married    Spouse name: Not on file   Number of children: Not on file   Years of education: Not on file   Highest education level: Not on file  Occupational History   Not on file  Tobacco Use   Smoking status: Former    Types: Cigarettes    Quit date: 06/17/1981    Years since quitting: 40.5   Smokeless tobacco: Never  Vaping Use   Vaping Use: Never used  Substance and Sexual Activity   Alcohol use: No    Alcohol/week: 0.0 standard drinks of alcohol   Drug use: No   Sexual activity: Yes    Partners: Male    Birth control/protection: Surgical    Comment: 1st intercourse- 15, partners- 2, married- 89yrs   Other Topics Concern   Not on file  Social History Narrative   Not on file   Social Determinants of Health   Financial Resource Strain: Low Risk  (12/23/2021)   Overall Financial Resource Strain (CARDIA)    Difficulty of Paying Living Expenses: Not hard at all  Food Insecurity: No Food Insecurity (12/23/2021)   Hunger Vital Sign    Worried About Running Out of Food in the Last Year: Never true    REnterprisein the Last Year: Never true  Transportation Needs: No Transportation Needs (12/23/2021)   PRAPARE - THydrologist(Medical): No    Lack of Transportation (Non-Medical): No  Physical Activity: Sufficiently  Active (12/23/2021)   Exercise Vital Sign    Days of Exercise per Week: 7 days    Minutes of Exercise per Session: 30 min  Stress: No Stress Concern Present (12/23/2021)   [place]   Feeling of Stress : Not at all  Social Connections: [place](12/23/2021)   Social Connection and Isolation Panel [NHANES]    Frequency of Communication with Friends and Family: More than three  times a week    Frequency of Social Gatherings with Friends and Family: Twice a week    Attends Religious Services: More than 4 times per year    Active Member of [employer] or Organizations: Yes    Attends Archivist Meetings: 1 to 4 times per year    Marital Status: Married    Tobacco Counseling Counseling given: Not Answered   Clinical Intake:  Sandra Roy , Thank you for taking time to come for your Medicare Wellness Visit. I appreciate your ongoing commitment to your health goals. Please review the following plan we discussed and let me know if I can assist you in the future.   These are the goals we discussed:  Goals      Patient Stated     Lose some weight.        This is a list of the screening recommended for you and due dates:  Health Maintenance  Topic Date Due   Mammogram  11/01/2021   Zoster (Shingles) Vaccine (1 of 2) 03/05/2022*   Flu Shot  07/13/2022*   Pneumonia Vaccine (1 - PCV) 12/04/2022*   Tetanus Vaccine  12/04/2022*   Colon Cancer Screening  10/23/2031   DEXA scan (bone density measurement)  Completed   Hepatitis C Screening: USPSTF Recommendation to screen - Ages 13-79 yo.  Completed   HPV Vaccine  Aged Out   Pap Smear  Discontinued   COVID-19 Vaccine  Discontinued  *Topic was postponed. The date shown is not the original due date.    Pre-visit preparation completed: Yes Diabetic?NO   Activities of Daily Living    12/23/2021    8:37 AM  In your present state of health, do you have any  difficulty performing the following activities:  Hearing? 0  Vision? 0  Difficulty concentrating or making decisions? 0  Walking or climbing stairs? 0  Dressing or bathing? 0  Doing errands, shopping? 0  Preparing Food and eating ? N  Using the Toilet? N  In the past six months, have you accidently leaked urine? N  Do you have problems with loss of bowel control? N  Managing your Medications? N  Managing your Finances? N  Housekeeping or managing your Housekeeping? N    Patient Care Team: Johnette Abraham, MD as PCP - General (Internal Medicine)  Indicate any recent Medical Services you may have received from other than [facility] providers in the past year (date may be approximate).     Assessment:   This is a routine wellness examination for Sandra Roy.  Hearing/Vision screen No results found.  Dietary issues and exercise activities discussed: Current Exercise Habits: Home exercise routine, Type of exercise: walking, Time (Minutes): 30, Frequency (Times/Week): 6, Weekly Exercise (Minutes/Week): 180, Intensity: Mild, Exercise limited by: None identified   Goals Addressed             This Visit's Progress    Patient Stated       Lose some weight.      Depression Screen    12/23/2021    8:37 AM 12/23/2021    8:36 AM 12/23/2021    8:06 AM 12/03/2021   10:28 AM  PHQ 2/9 Scores  PHQ - 2 Score 0 0 0 1    Fall Risk    12/23/2021    8:37 AM 12/23/2021    8:06 AM 12/03/2021   10:27 AM  Fall Risk   Falls in the past year? 0 0 1  Number falls in  past yr: 0 0 0  Injury with Fall? 0 0 0  Risk for fall due to : No Fall Risks No Fall Risks Impaired balance/gait  Follow up Falls evaluation completed Falls evaluation completed Falls evaluation completed    [place]:  Any stairs in or around the home? Yes  If so, are there any without handrails? Yes  Home free of loose throw rugs in walkways, pet beds, electrical cords, etc? Yes  Adequate  lighting in your home to reduce risk of falls? Yes   ASSISTIVE DEVICES UTILIZED TO PREVENT FALLS:  Life alert? No  Use of a cane, walker or w/c? No  Grab bars in the bathroom? Yes  Shower chair or bench in shower? Yes  Elevated toilet seat or a handicapped toilet? Yes    Immunizations Immunization History  Administered Date(s) Administered   Influenza,inj,Quad PF,6+ Mos 02/17/2014   Moderna Sars-Covid-2 Vaccination 05/28/2019, 06/26/2019    TDAP status: Due, Education has been provided regarding the importance of this vaccine. Advised may receive this vaccine at local pharmacy or Health Dept. Aware to provide a copy of the vaccination record if obtained from local pharmacy or Health Dept. Verbalized acceptance and understanding.  Flu Vaccine status: Due, Education has been provided regarding the importance of this vaccine. Advised may receive this vaccine at local pharmacy or Health Dept. Aware to provide a copy of the vaccination record if obtained from local pharmacy or Health Dept. Verbalized acceptance and understanding.  Pneumococcal vaccine status: Due, Education has been provided regarding the importance of this vaccine. Advised may receive this vaccine at local pharmacy or Health Dept. Aware to provide a copy of the vaccination record if obtained from local pharmacy or Health Dept. Verbalized acceptance and understanding.  Covid-19 vaccine status: Completed vaccines  Qualifies for Shingles Vaccine? Yes   Zostavax completed No   Shingrix Completed?: No.    Education has been provided regarding the importance of this vaccine. Patient has been advised to call insurance company to determine out of pocket expense if they have not yet received this vaccine. Advised may also receive vaccine at local pharmacy or Health Dept. Verbalized acceptance and understanding.  Screening Tests Health Maintenance  Topic Date Due   MAMMOGRAM  11/01/2021   Zoster Vaccines- Shingrix (1 of 2)  03/05/2022 (Originally 02/17/2004)   INFLUENZA VACCINE  07/13/2022 (Originally 11/12/2021)   Pneumonia Vaccine 58+ Years old (1 - PCV) 12/04/2022 (Originally 02/17/2019)   TETANUS/TDAP  12/04/2022 (Originally 02/16/1975)   COLONOSCOPY (Pts 45-12yr Insurance coverage will need to be confirmed)  10/23/2031   DEXA SCAN  Completed   Hepatitis C Screening  Completed   HPV VACCINES  Aged Out   PAP SMEAR-Modifier  Discontinued   COVID-19 Vaccine  Discontinued    Health Maintenance  Health Maintenance Due  Topic Date Due   MAMMOGRAM  11/01/2021    Colorectal cancer screening: Type of screening: Colonoscopy. Completed 10/22/21. Repeat every 10 years  Mammogram status: Completed 11/01/20. Repeat every year  Bone Density status: Completed 08/28/16. Results reflect: Bone density results: OSTEOPENIA. Repeat every 2 years.  Lung Cancer Screening: (Low Dose CT Chest recommended if Age 68-80years, 30 pack-year currently smoking OR have quit w/in 15years.) does not qualify.   Lung Cancer Screening Referral: no  Additional Screening:  Hepatitis C Screening: does qualify; Completed 06/27/2014  Vision Screening: Recommended annual ophthalmology exams for early detection of glaucoma and other disorders of the eye. Is the patient up  to date with their annual eye exam?  Yes  Who is the provider or what is the name of the office in which the patient attends annual eye exams? [facility] [place]. If pt is not established with a provider, would they like to be referred to a provider to establish care? No .   Dental Screening: Recommended annual dental exams for proper oral hygiene  Community Resource Referral / Chronic Care Management: CRR required this visit?  No   CCM required this visit?  No      Plan:     I have personally reviewed and noted the following in the patient's chart:   Medical and social history Use of alcohol, tobacco or illicit drugs  Current medications and supplements  including opioid prescriptions. Patient is not currently taking opioid prescriptions. Functional ability and status Nutritional status Physical activity Advanced directives List of other physicians Hospitalizations, surgeries, and ER visits in previous 12 months Vitals Screenings to include cognitive, depression, and falls Referrals and appointments  In addition, I have reviewed and discussed with patient certain preventive protocols, quality metrics, and best practice recommendations. A written personalized care plan for preventive services as well as general preventive health recommendations were provided to patient.     Quentin Angst, [place]   12/23/2021

## 2021-12-23 NOTE — Progress Notes (Signed)
   Virtual Visit via Telephone Note  I connected with Laurren Marquis Lunch on 12/23/21 at  8:00 AM EDT by telephone and verified that I am speaking with the correct person using two identifiers.  Location: Patient: [address], [place], [place] [address] Provider: [facility] [phone] [address], [place], [place] [address])   I discussed the limitations, risks, security and privacy concerns of performing an evaluation and management service by telephone and the availability of in person appointments. I also discussed with the patient that there may be a patient responsible charge related to this service. The patient expressed understanding and agreed to proceed.   History of Present Illness: Ms. Genther is a 66 year old woman evaluated today for an acute visit via telephone encounter after testing positive for COVID-19 yesterday (9/10).  She developed headache, sinus pressure, and a nonproductive cough approximately 2 days ago.  She performed two home COVID-19 test yesterday, which resulted positive. Ms. Legrande additionally endorses a subjective fever last night.  She was recently at the beach with more than 20 people.  To the best of her knowledge, she has only 1 that has is a positive for COVID-19. Ms. Poliquin is vaccinated.  She has been managing her symptoms with Delsym, Zyrtec, and Benadryl.  Overall she is feeling about the same as when she initially developed symptoms.  This is not her first time having COVID.  She states that she previously had [place] 2 years ago and was treated with Paxlovid, which significantly proved her symptoms.  Today she is requesting medication for treatment of COVID-19 and further symptomatic relief.  Assessment and Plan:  COVID-19 Given her age ( >/= 80) and symptom onset within the last 5 days, she meets criteria for outpatient treatment for COVID-19.  I have prescribed Paxlovid.  GFR 97 on 8/22.  I have also prescribed Robitussin-DM for cough relief.  We will plan  for follow-up in 2 weeks.  Follow Up Instructions:  I discussed the assessment and treatment plan with the patient. The patient was provided an opportunity to ask questions and all were answered. The patient agreed with the plan and demonstrated an understanding of the instructions.   The patient was advised to call back or seek an in-person evaluation if the symptoms worsen or if the condition fails to improve as anticipated.  I provided 13 minutes of non-face-to-face time during this encounter.   Johnette Abraham, MD

## 2021-12-23 NOTE — Patient Instructions (Signed)
  Sandra Roy , Thank you for taking time to come for your Medicare Wellness Visit. I appreciate your ongoing commitment to your health goals. Please review the following plan we discussed and let me know if I can assist you in the future.   These are the goals we discussed:  Goals      Patient Stated     Lose some weight.        This is a list of the screening recommended for you and due dates:  Health Maintenance  Topic Date Due   Mammogram  11/01/2021   Zoster (Shingles) Vaccine (1 of 2) 03/05/2022*   Flu Shot  07/13/2022*   Pneumonia Vaccine (1 - PCV) 12/04/2022*   Tetanus Vaccine  12/04/2022*   Colon Cancer Screening  10/23/2031   DEXA scan (bone density measurement)  Completed   Hepatitis C Screening: USPSTF Recommendation to screen - Ages 15-79 yo.  Completed   HPV Vaccine  Aged Out   Pap Smear  Discontinued   COVID-19 Vaccine  Discontinued  *Topic was postponed. The date shown is not the original due date.

## 2022-01-07 ENCOUNTER — Ambulatory Visit (INDEPENDENT_AMBULATORY_CARE_PROVIDER_SITE_OTHER): Payer: Medicare Other | Admitting: Internal Medicine

## 2022-01-07 ENCOUNTER — Encounter: Payer: Self-pay | Admitting: Internal Medicine

## 2022-01-07 VITALS — BP 138/86 | HR 90 | Ht 64.5 in | Wt 198.0 lb

## 2022-01-07 DIAGNOSIS — R030 Elevated blood-pressure reading, without diagnosis of hypertension: Secondary | ICD-10-CM

## 2022-01-07 DIAGNOSIS — E669 Obesity, unspecified: Secondary | ICD-10-CM | POA: Diagnosis not present

## 2022-01-07 DIAGNOSIS — E782 Mixed hyperlipidemia: Secondary | ICD-10-CM | POA: Diagnosis not present

## 2022-01-07 DIAGNOSIS — Z23 Encounter for immunization: Secondary | ICD-10-CM | POA: Diagnosis not present

## 2022-01-07 DIAGNOSIS — R7303 Prediabetes: Secondary | ICD-10-CM

## 2022-01-07 MED ORDER — TIRZEPATIDE 2.5 MG/0.5ML ~~LOC~~ SOAJ: 2.5000 mg | SUBCUTANEOUS | 0 refills | Status: AC

## 2022-01-07 NOTE — Patient Instructions (Addendum)
It was a pleasure to see you today.  Thank you for giving [place] the opportunity to be involved in your care.  Below is a brief recap of your visit and next steps.  We will plan to see you again in 4 weeks.  Summary I have prescribed Mounjaro today for weight loss You will receive your flu shot today We will continue to monitor your blood pressure. I recommend that you check it routinely at home.  Next steps Follow up in 4 weeks Please bring a blood pressure log with you Please see the attached information about the Mediterranean diet

## 2022-01-07 NOTE — Progress Notes (Unsigned)
Established Patient Office Visit  Subjective   Patient ID: Sandra Roy, female    DOB: [birth year]  Age: 66 y.o. MRN: [id number]  Chief Complaint  Patient presents with   Follow-up    2 week     Sandra Roy is a 66 year old woman returning to care today.  She has a past medical history significant for hypothyroidism secondary to Hashimoto's thyroiditis, prediabetes, anxiety, osteopenia, GERD, and factor V Leiden mutation.  Last seen by me on 12/03/2021 to establish care.  At that time she was noted to have an elevated blood pressure reading and expressed frustration over inability to lose weight.  2-week follow-up was planned.  In the interim, Sandra Roy tested positive for COVID-19.  She was seen by me via virtual encounter for an acute visit to review her symptoms.  She was prescribed Paxlovid at that time.  Today she reports feeling well.  The only symptom she endorses is a resolving cough.  Her blood pressure today is 138/86.  She states that she feels stressed due to her husband's health. Ms. Kooyman has no new concerns today.  She wanted to discuss weight loss strategy and states that she has a friend currently prescribed Mounjaro for weight loss. Sandra Roy has been attempting to exercise regularly around her home.  She walks several days weekly and is attempting to modify her diet.  She no longer drinks soda and is incorporating more water into her daily routine.  She is attempting to limit fatty/fried foods and eat more vegetables.  Medical conditions discussed today are individually addressed in A/P below.  Past Medical History:  Diagnosis Date   Adnexal mass 05/2000   Left   Anemia    history of   Anxiety    Autoimmune thyroiditis    Carpal tunnel syndrome    Right   Depression    Factor V Leiden mutation ([place])    Carrier   Foot fracture, left 05/06/2017   Hashimoto's disease    Hemorrhoids 04/25/2004   Mild external and internal noted on colonoscopy   History of colon  polyps 04/25/2004   History of gastritis 04/25/2004   History of hiatal hernia 06/21/2002   History of menorrhagia    Hypoglycemic syndrome    Hypothyroidism    Lipoma    Bilateral ankles   NSVD (normal spontaneous vaginal delivery)    x3   OA (osteoarthritis)    Osteopenia    Osteopenia    Mild   PONV (postoperative nausea and vomiting)    Hysterectomy surgery blood pressure dropped   Thyroid nodule 03/23/2013   Bilateral 41m , noted on [place]  Vasovagal syncope    salty drinks help (gatorade)   Vitamin D deficiency    Past Surgical History:  Procedure Laterality Date   ABDOMINAL HYSTERECTOMY  2003   TAH/BSO   ANKLE SURGERY Bilateral 09/18/2017   excision lipomas    BLEPHAROPLASTY Bilateral    CARPAL TUNNEL RELEASE Right 2003   COLONOSCOPY     DILATION AND CURETTAGE OF UTERUS     ENDOMETRIAL ABLATION     KNEE ARTHROSCOPY  2010   RIGHT   LIPOMA EXCISION Bilateral    2019   PELVIC LAPAROSCOPY     LSO   TOTAL KNEE ARTHROPLASTY Right 02/12/2018   Procedure: RIGHT TOTAL KNEE ARTHROPLASTY;  Surgeon: CSydnee Cabal MD;  Location: [facility];  Service: Orthopedics;  Laterality: Right;   TUBAL LIGATION  1976   UPPER GI ENDOSCOPY  Social History   Tobacco Use   Smoking status: Former    Types: Cigarettes    Quit date: 06/17/1981    Years since quitting: 40.5   Smokeless tobacco: Never  Vaping Use   Vaping Use: Never used  Substance Use Topics   Alcohol use: No    Alcohol/week: 0.0 standard drinks of alcohol   Drug use: No   Family History  Problem Relation Age of Onset   Diabetes Father    Heart disease Father    Hypertension Sister    Cancer Sister        LUNG  (SMOKER)   Diabetes Brother    Hypertension Brother    Breast cancer Neg Hx    Allergies  Allergen Reactions   Adhesive [Tape]     blisters   Aminoquinolines Other (See Comments)    Unknown   Azithromycin Other (See Comments)    Unknown   Bactrim [Sulfamethoxazole-Trimethoprim] Other (See  Comments)    Unknown   Clarithromycin Other (See Comments)    UTI   Naproxen Sodium Other (See Comments)    Acid reflux   Other Swelling    Monocryl stitch 5-0 Plain Catgut suture   Vicodin [Hydrocodone-Acetaminophen] Nausea And Vomiting   Review of Systems  Respiratory:  Positive for cough (resolving cough).   All other systems reviewed and are negative.    Objective:     BP 138/86   Pulse 90   Ht 5' 4.5" (1.638 m)   Wt 198 lb (89.8 kg)   SpO2 92%   BMI 33.46 kg/m  BP Readings from Last 3 Encounters:  01/07/22 138/86  12/03/21 (!) 140/78  05/14/21 (!) 145/88   Physical Exam Vitals reviewed.  Constitutional:      General: She is not in acute distress.    Appearance: Normal appearance. She is obese. She is not toxic-appearing.  HENT:     Head: Normocephalic and atraumatic.     Right Ear: External ear normal.     Left Ear: External ear normal.     Nose: Nose normal. No congestion or rhinorrhea.     Mouth/Throat:     Mouth: Mucous membranes are dry.     Pharynx: Oropharynx is clear. No oropharyngeal exudate or posterior oropharyngeal erythema.  Eyes:     General: No scleral icterus.    Extraocular Movements: Extraocular movements intact.     Conjunctiva/sclera: Conjunctivae normal.     Pupils: Pupils are equal, round, and reactive to light.  Cardiovascular:     Rate and Rhythm: Normal rate and regular rhythm.     Pulses: Normal pulses.     Heart sounds: Normal heart sounds. No murmur heard.    No friction rub. No gallop.  Pulmonary:     Effort: Pulmonary effort is normal.     Breath sounds: Normal breath sounds. No wheezing, rhonchi or rales.  Abdominal:     General: Abdomen is flat. Bowel sounds are normal. There is no distension.     Palpations: Abdomen is soft.     Tenderness: There is no abdominal tenderness.  Musculoskeletal:        General: Normal range of motion.     Cervical back: Normal range of motion.     Right lower leg: No edema.     Left  lower leg: No edema.  Lymphadenopathy:     Cervical: No cervical adenopathy.  Skin:    General: Skin is dry.     Capillary Refill: Capillary refill  takes less than 2 seconds.     Coloration: Skin is not jaundiced.  Neurological:     General: No focal deficit present.     Mental Status: She is alert and oriented to person, place, and time.  Psychiatric:        Mood and Affect: Mood normal.        Behavior: Behavior normal.    Last CBC Lab Results  Component Value Date   WBC 5.9 12/03/2021   HGB 13.1 12/03/2021   HCT 39.6 12/03/2021   MCV 93 12/03/2021   MCH 30.9 12/03/2021   RDW 13.1 12/03/2021   PLT 233 39/76/7341   Last metabolic panel Lab Results  Component Value Date   GLUCOSE 80 12/03/2021   NA 145 (H) 12/03/2021   K 4.2 12/03/2021   CL 107 (H) 12/03/2021   CO2 27 12/03/2021   BUN 11 12/03/2021   CREATININE 0.63 12/03/2021   EGFR 97 12/03/2021   CALCIUM 9.2 12/03/2021   PROT 6.7 12/03/2021   ALBUMIN 4.6 12/03/2021   LABGLOB 2.1 12/03/2021   AGRATIO 2.2 12/03/2021   BILITOT 0.3 12/03/2021   ALKPHOS 102 12/03/2021   AST 25 12/03/2021   ALT 19 12/03/2021   ANIONGAP 7 02/13/2018   Last lipids Lab Results  Component Value Date   CHOL 183 12/03/2021   HDL 58 12/03/2021   LDLCALC 111 (H) 12/03/2021   TRIG 78 12/03/2021   CHOLHDL 3.2 12/03/2021   Last hemoglobin A1c Lab Results  Component Value Date   HGBA1C 5.9 (H) 12/03/2021   Last thyroid functions Lab Results  Component Value Date   TSH 1.400 12/03/2021   T4TOTAL 7.6 06/18/2011   Last vitamin D Lab Results  Component Value Date   VD25OH 46.3 12/03/2021   Last vitamin B12 and Folate Lab Results  Component Value Date   VITAMINB12 329 12/03/2021   FOLATE 10.3 12/03/2021    The 10-year ASCVD risk score (Arnett DK, et al., 2019) is: 7.5%    Assessment & Plan:   Problem List Items Addressed This Visit       Obesity (BMI 30.0-34.9) - Primary    BMI 33.4.  She has previously expressed  frustration over inability to lose weight despite making significant lifestyle modifications aimed at weight loss.  She is exercising regularly and has cut sodas out of her diet.  She is also attempting to reduce the amount of fatty and fried foods that she eats.  She is interested in starting a medication for weight loss.  She has a friend currently prescribed Mounjaro and she is interested in starting this medication as well. -Through shared decision making, I have prescribed Mounjaro 2.5 mg weekly injection x4 weeks. -Plan for follow-up in 4 weeks and increase Mounjaro dosage at that time pending tolerance      Elevated blood pressure reading    Presenting today for follow-up for blood pressure assessment.  BP 130/86 today.  She states that she feels rather stressed due to her husband's health.  She has not previously been diagnosed with hypertension. -For now we will continue to closely monitor her blood pressure.  She will follow-up in 4 weeks.  If BP remains elevated at that time, my recommendation will be to start antihypertensive therapy.      Prediabetes    Her hemoglobin A1c remains in the prediabetic range, 5.9 when checked last month.  We discussed lifestyle modifications aimed at weight loss including limiting fatty/fried foods and incorporating more fruits,  nuts, and vegetables into her diet.  She has no longer drinking soft drinks.  She states that she is exercising regularly.  She would also like to start [facility] today for both obesity and prediabetes. [name] prescribed today -Plan for follow-up in 4 weeks      Hyperlipidemia    Total cholesterol 183 and LDL 111 on lipid panel from last month.  Her calculated 10-year ASCVD risk score today is 7.5%.  She is not currently prescribed a statin or any cholesterol-lowering therapy. -I reviewed with Ms. Hund that a high intensity statin is recommended based on her current 10-year ASCVD risk score.  She expressed understanding but also  feels that her cholesterol and overall ASCVD risk will improve with weight loss.  She has been prescribed Mounjaro today.  She would like to defer starting a statin for now.  We will repeat a lipid panel in 3 months.      Need for influenza vaccination    Influenza vaccine administered today      Return in about 4 weeks (around 02/04/2022).    Johnette Abraham, MD

## 2022-01-08 DIAGNOSIS — E785 Hyperlipidemia, unspecified: Secondary | ICD-10-CM | POA: Insufficient documentation

## 2022-01-08 DIAGNOSIS — R7303 Prediabetes: Secondary | ICD-10-CM | POA: Insufficient documentation

## 2022-01-08 DIAGNOSIS — Z23 Encounter for immunization: Secondary | ICD-10-CM | POA: Insufficient documentation

## 2022-01-08 NOTE — Assessment & Plan Note (Addendum)
Total cholesterol 183 and LDL 111 on lipid panel from last month.  Her calculated 10-year ASCVD risk score today is 7.5%.  She is not currently prescribed a statin or any cholesterol-lowering therapy. -I reviewed with Sandra Roy that a high intensity statin is recommended based on her current 10-year ASCVD risk score.  She expressed understanding but also feels that her cholesterol and overall ASCVD risk will improve with weight loss.  She has been prescribed Mounjaro today.  She would like to defer starting a statin for now.  We will repeat a lipid panel in 3 months.

## 2022-01-08 NOTE — Assessment & Plan Note (Signed)
BMI 33.4.  She has previously expressed frustration over inability to lose weight despite making significant lifestyle modifications aimed at weight loss.  She is exercising regularly and has cut sodas out of her diet.  She is also attempting to reduce the amount of fatty and fried foods that she eats.  She is interested in starting a medication for weight loss.  She has a friend currently prescribed Mounjaro and she is interested in starting this medication as well. -Through shared decision making, I have prescribed Mounjaro 2.5 mg weekly injection x4 weeks. -Plan for follow-up in 4 weeks and increase Mounjaro dosage at that time pending tolerance

## 2022-01-08 NOTE — Assessment & Plan Note (Signed)
Her hemoglobin A1c remains in the prediabetic range, 5.9 when checked last month.  We discussed lifestyle modifications aimed at weight loss including limiting fatty/fried foods and incorporating more fruits, nuts, and vegetables into her diet.  She has no longer drinking soft drinks.  She states that she is exercising regularly.  She would also like to start [facility] today for both obesity and prediabetes. [name] prescribed today -Plan for follow-up in 4 weeks

## 2022-01-08 NOTE — Assessment & Plan Note (Addendum)
Presenting today for follow-up for blood pressure assessment.  BP 130/86 today.  She states that she feels rather stressed due to her husband's health.  She has not previously been diagnosed with hypertension. -For now we will continue to closely monitor her blood pressure.  She will follow-up in 4 weeks.  If BP remains elevated at that time, my recommendation will be to start antihypertensive therapy.

## 2022-01-08 NOTE — Assessment & Plan Note (Signed)
Influenza vaccine administered today.

## 2022-01-15 ENCOUNTER — Ambulatory Visit (HOSPITAL_COMMUNITY)
Admission: RE | Admit: 2022-01-15 | Discharge: 2022-01-15 | Disposition: A | Discharge status: Home / Self Care | Payer: Medicare Other | Source: Ambulatory Visit | Attending: Internal Medicine | Admitting: Internal Medicine

## 2022-01-15 DIAGNOSIS — Z0000 Encounter for general adult medical examination without abnormal findings: Secondary | ICD-10-CM | POA: Insufficient documentation

## 2022-01-15 DIAGNOSIS — Z1231 Encounter for screening mammogram for malignant neoplasm of breast: Secondary | ICD-10-CM | POA: Diagnosis not present

## 2022-02-04 ENCOUNTER — Ambulatory Visit (INDEPENDENT_AMBULATORY_CARE_PROVIDER_SITE_OTHER): Payer: Medicare Other | Admitting: Internal Medicine

## 2022-02-04 ENCOUNTER — Encounter: Payer: Self-pay | Admitting: Internal Medicine

## 2022-02-04 VITALS — BP 138/79 | HR 76 | Ht 64.0 in | Wt 193.8 lb

## 2022-02-04 DIAGNOSIS — R030 Elevated blood-pressure reading, without diagnosis of hypertension: Secondary | ICD-10-CM | POA: Diagnosis not present

## 2022-02-04 DIAGNOSIS — E669 Obesity, unspecified: Secondary | ICD-10-CM | POA: Diagnosis not present

## 2022-02-04 MED ORDER — LEVOTHYROXINE SODIUM 88 MCG PO TABS: 88.0000 ug | ORAL_TABLET | Freq: Every day | ORAL | 1 refills | Status: DC

## 2022-02-04 MED ORDER — TIRZEPATIDE 5 MG/0.5ML ~~LOC~~ SOAJ: 5.0000 mg | SUBCUTANEOUS | 0 refills | Status: DC

## 2022-02-04 MED ORDER — OMEPRAZOLE 40 MG PO CPDR: 40.0000 mg | DELAYED_RELEASE_CAPSULE | Freq: Every day | ORAL | 1 refills | Status: DC

## 2022-02-04 NOTE — Assessment & Plan Note (Signed)
Returning to care today for BP check.  Her blood pressure was elevated her last appointment.  In the interim, she has been checking her blood pressure daily at home and keeping a BP log.  Her home BP readings are consistently within goal.  She does not meet criteria for the diagnosis of hypertension.  We will not start any antihypertensive medications today.

## 2022-02-04 NOTE — Patient Instructions (Signed)
It was a pleasure to see you today.  Thank you for giving [place] the opportunity to be involved in your care.  Below is a brief recap of your visit and next steps.  We will plan to see you again in 3 months.  Summary We will increase Mounjaro to 5 mg weekly today Your home blood pressure readings look great today. There is no need to start you on blood pressure medicine. I have refilled your thyroid and reflux medicines  Next steps Follow up in 3 months

## 2022-02-04 NOTE — Assessment & Plan Note (Signed)
Mounjaro 2.5 mg weekly injections were prescribed at her last appointment.  She has tolerated the medication well and denies significant side effects. -Increase Mounjaro to 5 mg weekly -Follow up in 3 months for reassessment

## 2022-02-04 NOTE — Progress Notes (Signed)
Established Patient Office Visit  Subjective   Patient ID: Sandra Roy, female    DOB: 08-09-1955  Age: 66 y.o. MRN: [id number]  Chief Complaint  Patient presents with   Follow-up   Sandra Roy returns to care today.  She is a 66 year old woman with a past medical history significant for hypothyroidism secondary to Hashimoto's thyroiditis, prediabetes, anxiety, osteopenia, GERD, and factor V Leiden mutation.  She was last seen by me on 9/26 at which time she expressed an interest in starting [facility] for obesity.  Her blood pressure was also elevated.  Mounjaro was prescribed and 4-week follow-up was arranged for BP check.  There have been no acute interval events.  Today Sandra Roy states that she feels well.  She has been taking Mounjaro as prescribed and has not experienced any significant symptoms.  She has been checking her blood pressure daily at home and brings a log with her today.  Systolic BP readings are consistently 110s-120s.  Acute concerns, chronic medical conditions, and outstanding preventative healthcare maintenance items discussed today are individually addressed in A/P below.  Past Medical History:  Diagnosis Date   Adnexal mass 05/2000   Left   Anemia    history of   Anxiety    Autoimmune thyroiditis    Carpal tunnel syndrome    Right   Depression    Factor V Leiden mutation ([place])    Carrier   Foot fracture, left 05/06/2017   Hashimoto's disease    Hemorrhoids 04/25/2004   Mild external and internal noted on colonoscopy   History of colon polyps 04/25/2004   History of gastritis 04/25/2004   History of hiatal hernia 06/21/2002   History of menorrhagia    Hypoglycemic syndrome    Hypothyroidism    Lipoma    Bilateral ankles   NSVD (normal spontaneous vaginal delivery)    x3   OA (osteoarthritis)    Osteopenia    Osteopenia    Mild   PONV (postoperative nausea and vomiting)    Hysterectomy surgery blood pressure dropped   Thyroid nodule  03/23/2013   Bilateral 64m , noted on [place]  Vasovagal syncope    salty drinks help (gatorade)   Vitamin D deficiency    Past Surgical History:  Procedure Laterality Date   ABDOMINAL HYSTERECTOMY  2003   TAH/BSO   ANKLE SURGERY Bilateral 09/18/2017   excision lipomas    BLEPHAROPLASTY Bilateral    CARPAL TUNNEL RELEASE Right 2003   COLONOSCOPY     DILATION AND CURETTAGE OF UTERUS     ENDOMETRIAL ABLATION     KNEE ARTHROSCOPY  2010   RIGHT   LIPOMA EXCISION Bilateral    2019   PELVIC LAPAROSCOPY     LSO   TOTAL KNEE ARTHROPLASTY Right 02/12/2018   Procedure: RIGHT TOTAL KNEE ARTHROPLASTY;  Surgeon: CSydnee Cabal MD;  Location: [facility];  Service: Orthopedics;  Laterality: Right;   TUBAL LIGATION  1976   UPPER GI ENDOSCOPY     Social History   Tobacco Use   Smoking status: Former    Types: Cigarettes    Quit date: 06/17/1981    Years since quitting: 40.6   Smokeless tobacco: Never  Vaping Use   Vaping Use: Never used  Substance Use Topics   Alcohol use: No    Alcohol/week: 0.0 standard drinks of alcohol   Drug use: No   Family History  Problem Relation Age of Onset   Diabetes Father    Heart  disease Father    Hypertension Sister    Cancer Sister        LUNG  (SMOKER)   Diabetes Brother    Hypertension Brother    Breast cancer Neg Hx    Allergies  Allergen Reactions   Adhesive [Tape]     blisters   Aminoquinolines Other (See Comments)    Unknown   Azithromycin Other (See Comments)    Unknown   Bactrim [Sulfamethoxazole-Trimethoprim] Other (See Comments)    Unknown   Clarithromycin Other (See Comments)    UTI   Naproxen Sodium Other (See Comments)    Acid reflux   Other Swelling    Monocryl stitch 5-0 Plain Catgut suture   Vicodin [Hydrocodone-Acetaminophen] Nausea And Vomiting   Review of Systems  Constitutional:  Negative for chills and fever.  HENT:  Negative for sore throat.   Respiratory:  Negative for cough and shortness of breath.    Cardiovascular:  Negative for chest pain, palpitations and leg swelling.  Gastrointestinal:  Negative for abdominal pain, blood in stool, constipation, diarrhea, nausea and vomiting.  Genitourinary:  Negative for dysuria and hematuria.  Musculoskeletal:  Negative for myalgias.  Skin:  Negative for itching and rash.  Neurological:  Negative for dizziness and headaches.  Psychiatric/Behavioral:  Negative for depression and suicidal ideas.      Objective:     BP 138/79   Pulse 76   Ht '5\' 4"'  (1.626 m)   Wt 193 lb 12.8 oz (87.9 kg)   SpO2 97%   BMI 33.27 kg/m  BP Readings from Last 3 Encounters:  02/04/22 138/79  01/07/22 138/86  12/03/21 (!) 140/78   Physical Exam Vitals reviewed.  Constitutional:      General: She is not in acute distress.    Appearance: Normal appearance. She is obese. She is not toxic-appearing.  HENT:     Head: Normocephalic and atraumatic.     Right Ear: External ear normal.     Left Ear: External ear normal.     Nose: Nose normal. No congestion or rhinorrhea.     Mouth/Throat:     Mouth: Mucous membranes are moist.     Pharynx: Oropharynx is clear. No oropharyngeal exudate or posterior oropharyngeal erythema.  Eyes:     General: No scleral icterus.    Extraocular Movements: Extraocular movements intact.     Conjunctiva/sclera: Conjunctivae normal.     Pupils: Pupils are equal, round, and reactive to light.  Cardiovascular:     Rate and Rhythm: Normal rate and regular rhythm.     Pulses: Normal pulses.     Heart sounds: Normal heart sounds. No murmur heard.    No friction rub. No gallop.  Pulmonary:     Effort: Pulmonary effort is normal.     Breath sounds: Normal breath sounds. No wheezing, rhonchi or rales.  Abdominal:     General: Abdomen is flat. Bowel sounds are normal. There is no distension.     Palpations: Abdomen is soft.     Tenderness: There is no abdominal tenderness.  Musculoskeletal:        General: Normal range of motion.      Cervical back: Normal range of motion.     Right lower leg: No edema.     Left lower leg: No edema.  Lymphadenopathy:     Cervical: No cervical adenopathy.  Skin:    General: Skin is dry.     Capillary Refill: Capillary refill takes less than 2 seconds.  Coloration: Skin is not jaundiced.  Neurological:     General: No focal deficit present.     Mental Status: She is alert and oriented to person, place, and time.  Psychiatric:        Mood and Affect: Mood normal.        Behavior: Behavior normal.    Last CBC Lab Results  Component Value Date   WBC 5.9 12/03/2021   HGB 13.1 12/03/2021   HCT 39.6 12/03/2021   MCV 93 12/03/2021   MCH 30.9 12/03/2021   RDW 13.1 12/03/2021   PLT 233 46/50/3546   Last metabolic panel Lab Results  Component Value Date   GLUCOSE 80 12/03/2021   NA 145 (H) 12/03/2021   K 4.2 12/03/2021   CL 107 (H) 12/03/2021   CO2 27 12/03/2021   BUN 11 12/03/2021   CREATININE 0.63 12/03/2021   EGFR 97 12/03/2021   CALCIUM 9.2 12/03/2021   PROT 6.7 12/03/2021   ALBUMIN 4.6 12/03/2021   LABGLOB 2.1 12/03/2021   AGRATIO 2.2 12/03/2021   BILITOT 0.3 12/03/2021   ALKPHOS 102 12/03/2021   AST 25 12/03/2021   ALT 19 12/03/2021   ANIONGAP 7 02/13/2018   Last lipids Lab Results  Component Value Date   CHOL 183 12/03/2021   HDL 58 12/03/2021   LDLCALC 111 (H) 12/03/2021   TRIG 78 12/03/2021   CHOLHDL 3.2 12/03/2021   Last hemoglobin A1c Lab Results  Component Value Date   HGBA1C 5.9 (H) 12/03/2021   Last thyroid functions Lab Results  Component Value Date   TSH 1.400 12/03/2021   T4TOTAL 7.6 06/18/2011   Last vitamin D Lab Results  Component Value Date   VD25OH 46.3 12/03/2021   Last vitamin B12 and Folate Lab Results  Component Value Date   VITAMINB12 329 12/03/2021   FOLATE 10.3 12/03/2021    The 10-year ASCVD risk score (Arnett DK, et al., 2019) is: 7.5%    Assessment & Plan:   Problem List Items Addressed This Visit        Obesity (BMI 30.0-34.9) - Primary    Mounjaro 2.5 mg weekly injections were prescribed at her last appointment.  She has tolerated the medication well and denies significant side effects. -Increase Mounjaro to 5 mg weekly -Follow up in 3 months for reassessment      Elevated blood pressure reading    Returning to care today for BP check.  Her blood pressure was elevated her last appointment.  In the interim, she has been checking her blood pressure daily at home and keeping a BP log.  Her home BP readings are consistently within goal.  She does not meet criteria for the diagnosis of hypertension.  We will not start any antihypertensive medications today.       Return in about 3 months (around 05/07/2022).    Johnette Abraham, MD

## 2022-02-10 ENCOUNTER — Ambulatory Visit
Admission: EM | Admit: 2022-02-10 | Discharge: 2022-02-10 | Disposition: A | Discharge status: Home / Self Care | Payer: Medicare Other | Attending: Family Medicine | Admitting: Family Medicine

## 2022-02-10 ENCOUNTER — Encounter: Payer: Self-pay | Admitting: Emergency Medicine

## 2022-02-10 ENCOUNTER — Other Ambulatory Visit: Payer: Self-pay

## 2022-02-10 DIAGNOSIS — R42 Dizziness and giddiness: Secondary | ICD-10-CM | POA: Diagnosis not present

## 2022-02-10 DIAGNOSIS — J0100 Acute maxillary sinusitis, unspecified: Secondary | ICD-10-CM

## 2022-02-10 DIAGNOSIS — J3089 Other allergic rhinitis: Secondary | ICD-10-CM

## 2022-02-10 LAB — POCT FASTING CBG KUC MANUAL ENTRY: POCT Glucose (KUC): 105 mg/dL — AB (ref 70–99)

## 2022-02-10 MED ORDER — AMOXICILLIN-POT CLAVULANATE 875-125 MG PO TABS: 1.0000 | ORAL_TABLET | Freq: Two times a day (BID) | ORAL | 0 refills | Status: DC

## 2022-02-10 NOTE — ED Triage Notes (Addendum)
Pt reports intermittent dizziness that is worse with position changes for last several days. Pt reports has had nasal congestion, facial pressure, headache for last several weeks as well. Had covid x1 month ago.

## 2022-02-10 NOTE — Discharge Instructions (Addendum)
I have sent over an antibiotic to treat a possible sinus infection.  Use a nasal spray like Flonase or Rhinocort twice daily and daily antihistamine such as Zyrtec.  You may also use sinus rinses, decongestant such as Coricidin HBP and humidifiers with Vicks vapor rub for additional support.  Try to avoid sudden position changes as this may trigger a vertigo type dizziness.  Follow-up with your primary care provider within the next week for a recheck of your symptoms.  Someone will call tomorrow if your labs come back abnormal.

## 2022-02-11 LAB — CBC WITH DIFFERENTIAL/PLATELET
Basophils Absolute: 0 10*3/uL (ref 0.0–0.2)
Basos: 0 %
EOS (ABSOLUTE): 0.1 10*3/uL (ref 0.0–0.4)
Eos: 2 %
Hematocrit: 40.1 % (ref 34.0–46.6)
Hemoglobin: 13.3 g/dL (ref 11.1–15.9)
Immature Grans (Abs): 0 10*3/uL (ref 0.0–0.1)
Immature Granulocytes: 0 %
Lymphocytes Absolute: 1.7 10*3/uL (ref 0.7–3.1)
Lymphs: 28 %
MCH: 31.4 pg (ref 26.6–33.0)
MCHC: 33.2 g/dL (ref 31.5–35.7)
MCV: 95 fL (ref 79–97)
Monocytes Absolute: 0.5 10*3/uL (ref 0.1–0.9)
Monocytes: 8 %
Neutrophils Absolute: 3.7 10*3/uL (ref 1.4–7.0)
Neutrophils: 62 %
Platelets: 247 10*3/uL (ref 150–450)
RBC: 4.24 x10E6/uL (ref 3.77–5.28)
RDW: 13.1 % (ref 11.7–15.4)
WBC: 6 10*3/uL (ref 3.4–10.8)

## 2022-02-11 LAB — COMPREHENSIVE METABOLIC PANEL
ALT: 23 IU/L (ref 0–32)
AST: 20 IU/L (ref 0–40)
Albumin/Globulin Ratio: 2 (ref 1.2–2.2)
Albumin: 4.5 g/dL (ref 3.9–4.9)
Alkaline Phosphatase: 105 IU/L (ref 44–121)
BUN/Creatinine Ratio: 17 (ref 12–28)
BUN: 11 mg/dL (ref 8–27)
Bilirubin Total: <0.2 mg/dL (ref 0.0–1.2)
CO2: 20 mmol/L (ref 20–29)
Calcium: 9 mg/dL (ref 8.7–10.3)
Chloride: 108 mmol/L — ABNORMAL HIGH (ref 96–106)
Creatinine, Ser: 0.66 mg/dL (ref 0.57–1.00)
Globulin, Total: 2.3 g/dL (ref 1.5–4.5)
Sodium: 146 mmol/L — ABNORMAL HIGH (ref 134–144)
Total Protein: 6.8 g/dL (ref 6.0–8.5)
eGFR: 96 mL/min/{1.73_m2} (ref >59)

## 2022-02-13 NOTE — ED Provider Notes (Signed)
[facility]    CSN: [id number] Arrival date & time: 02/10/22  6213      History   Chief Complaint Chief Complaint  Patient presents with   Cough    HPI Sandra Roy is a 66 y.o. female.   Patient presenting today with several day history of room spinning dizzy spells with changes in position, particularly bending down or getting up quickly.  She has also been having several weeks of nasal congestion, sinus pain and pressure, sinus headaches, ear pressure and popping.  Had COVID about a month ago, states she resolved from those symptoms but still having these lingering sinus symptoms.  Denies visual changes, speech or mental status changes, chest pain, shortness of breath, palpitations, nausea, vomiting.  No known history of cardiac issues but does have history of factor V Leiden, multiple autoimmune disease, prediabetes which she states she tends to have hypoglycemic episodes.  She has not had a meal since waking this morning but daughter made her eat a spoonful of jelly to get some glucose.    Past Medical History:  Diagnosis Date   Adnexal mass 05/2000   Left   Anemia    history of   Anxiety    Autoimmune thyroiditis    Carpal tunnel syndrome    Right   Depression    Factor V Leiden mutation ([place])    Carrier   Foot fracture, left 05/06/2017   Hashimoto's disease    Hemorrhoids 04/25/2004   Mild external and internal noted on colonoscopy   History of colon polyps 04/25/2004   History of gastritis 04/25/2004   History of hiatal hernia 06/21/2002   History of menorrhagia    Hypoglycemic syndrome    Hypothyroidism    Lipoma    Bilateral ankles   NSVD (normal spontaneous vaginal delivery)    x3   OA (osteoarthritis)    Osteopenia    Osteopenia    Mild   PONV (postoperative nausea and vomiting)    Hysterectomy surgery blood pressure dropped   Thyroid nodule 03/23/2013   Bilateral 64m , noted on [place]  Vasovagal syncope    salty drinks help  (gatorade)   Vitamin D deficiency     Patient Active Problem List   Diagnosis Date Noted   Prediabetes 01/08/2022   Hyperlipidemia 01/08/2022   Need for influenza vaccination 01/08/2022   Obesity (BMI 30.0-34.9) 12/03/2021   Elevated blood pressure reading 12/03/2021   Preventative health care 12/03/2021   Superficial vein thrombosis 11/06/2020   Osteoarthritis of right knee 02/12/2018   S/P knee replacement 02/12/2018   History of colonic polyps 06/26/2014   Anxiety 06/26/2014   Dyspareunia 06/22/2013   Vaginal atrophy 06/22/2013   Factor 5 Leiden mutation, heterozygous ([place] 06/21/2012   Osteopenia    Hypothyroidism due to Hashimoto's thyroiditis     Past Surgical History:  Procedure Laterality Date   ABDOMINAL HYSTERECTOMY  2003   TAH/BSO   ANKLE SURGERY Bilateral 09/18/2017   excision lipomas    BLEPHAROPLASTY Bilateral    CARPAL TUNNEL RELEASE Right 2003   COLONOSCOPY     DILATION AND CURETTAGE OF UTERUS     ENDOMETRIAL ABLATION     KNEE ARTHROSCOPY  2010   RIGHT   LIPOMA EXCISION Bilateral    2019   PELVIC LAPAROSCOPY     LSO   TOTAL KNEE ARTHROPLASTY Right 02/12/2018   Procedure: RIGHT TOTAL KNEE ARTHROPLASTY;  Surgeon: CSydnee Cabal MD;  Location: [facility];  Service: Orthopedics;  Laterality: Right;   TUBAL LIGATION  1976   UPPER GI ENDOSCOPY      OB History     Gravida  4   Para  3   Term  3   Preterm      AB  1   Living  3      SAB  1   IAB      Ectopic      Multiple      Live Births  3            Home Medications    Prior to Admission medications   Medication Sig Start Date End Date Taking? Authorizing Provider  amoxicillin-clavulanate (AUGMENTIN) 875-125 MG tablet Take 1 tablet by mouth every 12 (twelve) hours. 02/10/22  Yes Volney American, PA-C  ALPRAZolam Duanne Moron) 0.25 MG tablet Take 1 tablet (0.25 mg total) by mouth at bedtime as needed for anxiety. 12/03/21   Johnette Abraham, MD  Cholecalciferol (VITAMIN D3)  2000 units CHEW Chew 4,000 Units by mouth daily.    [provider]  diclofenac (VOLTAREN) 75 MG EC tablet Take 1 tablet (75 mg total) by mouth 2 (two) times daily. 12/09/21   Johnette Abraham, MD  fluticasone (FLONASE) 50 MCG/ACT nasal spray Place 1 spray into both nostrils daily as needed for allergies.     [provider]  levothyroxine (SYNTHROID) 88 MCG tablet Take 1 tablet (88 mcg total) by mouth daily before breakfast. 02/04/22 08/03/22  Johnette Abraham, MD  Melatonin 5 MG CHEW Chew 2 tablets by mouth at bedtime.    [provider]  Multiple Vitamins-Minerals (MULTIVITAMIN PO) Take 3 tablets by mouth daily.    [provider]  omeprazole (PRILOSEC) 40 MG capsule Take 1 capsule (40 mg total) by mouth daily. 02/04/22 08/03/22  Johnette Abraham, MD  tirzepatide [facility]) 5 MG/0.5ML Pen Inject 5 mg into the skin once a week for 12 doses. 02/04/22 04/23/22  Johnette Abraham, MD    Family History Family History  Problem Relation Age of Onset   Diabetes Father    Heart disease Father    Hypertension Sister    Cancer Sister        LUNG  (SMOKER)   Diabetes Brother    Hypertension Brother    Breast cancer Neg Hx     Social History Social History   Tobacco Use   Smoking status: Former    Types: Cigarettes    Quit date: 06/17/1981    Years since quitting: 40.6   Smokeless tobacco: Never  Vaping Use   Vaping Use: Never used  Substance Use Topics   Alcohol use: No    Alcohol/week: 0.0 standard drinks of alcohol   Drug use: No     Allergies   Adhesive [tape], Aminoquinolines, Azithromycin, Bactrim [sulfamethoxazole-trimethoprim], Clarithromycin, Naproxen sodium, Other, and Vicodin [hydrocodone-acetaminophen]   Review of Systems Review of Systems Per HPI  Physical Exam Triage Vital Signs ED Triage Vitals [02/10/22 0820]  Enc Vitals Group     BP (!) 149/93     Pulse Rate 78     Resp 20     Temp 97.6 F (36.4 C)     Temp Source Oral      SpO2 95 %     Weight      Height      Head Circumference      Peak Flow      Pain Score 2     Pain  Loc      Pain Edu?      Excl. in [place]?    No data found.  Updated Vital Signs BP (!) 149/93 (BP Location: Right Arm)   Pulse 78   Temp 97.6 F (36.4 C) (Oral)   Resp 20   SpO2 95%   Visual Acuity Right Eye Distance:   Left Eye Distance:   Bilateral Distance:    Right Eye Near:   Left Eye Near:    Bilateral Near:     Physical Exam Vitals and nursing note reviewed.  Constitutional:      Appearance: Normal appearance. She is not ill-appearing.  HENT:     Head: Atraumatic.     Ears:     Comments: Bilateral middle ear effusions    Nose: Congestion present.     Mouth/Throat:     Mouth: Mucous membranes are moist.  Eyes:     Extraocular Movements: Extraocular movements intact.     Conjunctiva/sclera: Conjunctivae normal.     Pupils: Pupils are equal, round, and reactive to light.  Cardiovascular:     Rate and Rhythm: Normal rate and regular rhythm.     Heart sounds: Normal heart sounds.  Pulmonary:     Effort: Pulmonary effort is normal.     Breath sounds: Normal breath sounds. No wheezing or rales.  Abdominal:     General: Bowel sounds are normal. There is no distension.     Palpations: Abdomen is soft.     Tenderness: There is no abdominal tenderness. There is no guarding.  Musculoskeletal:        General: No swelling or tenderness. Normal range of motion.     Cervical back: Normal range of motion and neck supple.  Skin:    General: Skin is warm and dry.  Neurological:     General: No focal deficit present.     Mental Status: She is alert and oriented to person, place, and time.     Cranial Nerves: No cranial nerve deficit.     Motor: No weakness.     Gait: Gait normal.  Psychiatric:        Mood and Affect: Mood normal.        Thought Content: Thought content normal.        Judgment: Judgment normal.      UC Treatments / Results  Labs (all labs ordered  are listed, but only abnormal results are displayed) Labs Reviewed  COMPREHENSIVE METABOLIC PANEL - Abnormal; Notable for the following components:      Result Value   Sodium 146 (*)    Chloride 108 (*)    All other components within normal limits   Narrative:    Performed at:  [address] [place], [place]  [id number] Lab Director: Rush Farmer MD, Phone:  [phone]  POCT FASTING [facility] [place] - Abnormal; Notable for the following components:   POCT Glucose ([facility]) 105 (*)    All other components within normal limits  CBC WITH DIFFERENTIAL/PLATELET   Narrative:    Performed at:  [facility] [address] [place], [place]  [id number] Lab Director: Rush Farmer MD, Phone:  [phone]    EKG   Radiology No results found.  Procedures Procedures (including critical care time)  Medications Ordered in UC Medications - No data to display  Initial Impression / Assessment and Plan / UC Course  I have reviewed the triage vital signs and the nursing notes.  Pertinent labs & imaging results that were available during my care of the patient were reviewed by me and considered in my medical decision making (see chart for details).     Mildly hypertensive in triage, otherwise vital signs benign and reassuring.  Exam very reassuring with no significant abnormalities.  Do suspect a sinus infection and middle ear effusion to be triggering some vertigo-like symptoms.  Treat sinus infection with Augmentin, discussed nasal sprays, allergy medication and supportive over-the-counter remedies.  Her point-of-care glucose random reading was 105 today, labs pending, EKG today normal sinus rhythm without acute ST or T wave changes.  Discussed ED precautions for significantly worsening symptoms.  Final Clinical Impressions(s) / UC Diagnoses   Final diagnoses:  Dizziness  Acute non-recurrent maxillary sinusitis  Seasonal allergic rhinitis due to other  allergic trigger     Discharge Instructions      I have sent over an antibiotic to treat a possible sinus infection.  Use a nasal spray like Flonase or Rhinocort twice daily and daily antihistamine such as Zyrtec.  You may also use sinus rinses, decongestant such as Coricidin HBP and humidifiers with Vicks vapor rub for additional support.  Try to avoid sudden position changes as this may trigger a vertigo type dizziness.  Follow-up with your primary care provider within the next week for a recheck of your symptoms.  Someone will call tomorrow if your labs come back abnormal.    ED Prescriptions     Medication Sig Dispense Auth. Provider   amoxicillin-clavulanate (AUGMENTIN) 875-125 MG tablet Take 1 tablet by mouth every 12 (twelve) hours. 14 tablet Volney American, [place]      PDMP not reviewed this encounter.   Volney American, [place] 02/13/22 [phone]

## 2022-03-03 ENCOUNTER — Ambulatory Visit (INDEPENDENT_AMBULATORY_CARE_PROVIDER_SITE_OTHER): Payer: Medicare Other | Admitting: Internal Medicine

## 2022-03-03 ENCOUNTER — Encounter: Payer: Self-pay | Admitting: Internal Medicine

## 2022-03-03 VITALS — BP 113/71 | HR 94 | Ht 64.0 in | Wt 189.2 lb

## 2022-03-03 DIAGNOSIS — J0110 Acute frontal sinusitis, unspecified: Secondary | ICD-10-CM | POA: Diagnosis not present

## 2022-03-03 DIAGNOSIS — E669 Obesity, unspecified: Secondary | ICD-10-CM | POA: Diagnosis not present

## 2022-03-03 MED ORDER — TIRZEPATIDE 5 MG/0.5ML ~~LOC~~ SOAJ: 5.0000 mg | SUBCUTANEOUS | 0 refills | Status: DC

## 2022-03-03 MED ORDER — DOXYCYCLINE HYCLATE 100 MG PO TABS: 100.0000 mg | ORAL_TABLET | Freq: Two times a day (BID) | ORAL | 0 refills | Status: AC

## 2022-03-03 NOTE — Assessment & Plan Note (Signed)
Presenting today for evaluation of persistent symptoms of frontal sinusitis.  She has previously been treated with amoxicillin x10 days.  She is using Zyrtec-D and Flonase for symptom relief. -Doxycycline 100 mg twice daily x7 days prescribed today -I recommended that she continue current supportive care measures and add a nasal saline rinse.  She can also use Tylenol as needed for pain relief

## 2022-03-03 NOTE — Progress Notes (Signed)
Acute Office Visit  Subjective:     Patient ID: Sandra Roy, female    DOB: October 28, 1955, 66 y.o.   MRN: [id number]  Chief Complaint  Patient presents with   Follow-up   Sandra Roy presents today for an acute visit today to discuss sinus congestion.  She reports a several week history of bilateral frontal sinus pain and pressure with throat irritation.  She also has experienced pressure in her ears.  She presented to urgent care recently for evaluation of her symptoms and was told that her eardrums are bulging.  She was prescribed amoxicillin x10 days, which she completed on 11/17.  Overall she feels as though her symptoms have somewhat improved, however she continues to have nasal drainage that is white and thick.  She additionally endorses subjective chills but has not checked her temperature.  She states that this typically happens once each fall and she requires multiple rounds of antibiotics for treatment.  She has completed a home COVID test, which was negative and has no known sick contacts.  Review of Systems  HENT:  Positive for ear pain, sinus pain and sore throat. Negative for hearing loss and tinnitus.   Eyes:  Positive for pain (Pain "behind the eyes").  Respiratory:  Negative for cough, shortness of breath and wheezing.   All other systems reviewed and are negative.     Objective:    BP 113/71   Pulse 94   Ht '5\' 4"'$  (1.626 m)   Wt 189 lb 3.2 oz (85.8 kg)   SpO2 94%   BMI 32.48 kg/m   Physical Exam Vitals reviewed.  Constitutional:      General: She is not in acute distress.    Appearance: Normal appearance. She is obese. She is not toxic-appearing.  HENT:     Head: Normocephalic and atraumatic.     Comments: TTP over the front sinuses    Right Ear: External ear normal.     Left Ear: External ear normal.     Nose: Nose normal. No congestion or rhinorrhea.     Mouth/Throat:     Mouth: Mucous membranes are moist.     Pharynx: Oropharynx is clear. No  oropharyngeal exudate or posterior oropharyngeal erythema.  Eyes:     General: No scleral icterus.    Extraocular Movements: Extraocular movements intact.     Conjunctiva/sclera: Conjunctivae normal.     Pupils: Pupils are equal, round, and reactive to light.  Cardiovascular:     Rate and Rhythm: Normal rate and regular rhythm.     Pulses: Normal pulses.     Heart sounds: Normal heart sounds. No murmur heard.    No friction rub. No gallop.  Pulmonary:     Effort: Pulmonary effort is normal.     Breath sounds: Normal breath sounds. No wheezing, rhonchi or rales.  Abdominal:     General: Abdomen is flat. Bowel sounds are normal. There is no distension.     Palpations: Abdomen is soft.     Tenderness: There is no abdominal tenderness.  Musculoskeletal:        General: Normal range of motion.     Cervical back: Normal range of motion.     Right lower leg: No edema.     Left lower leg: No edema.  Lymphadenopathy:     Cervical: No cervical adenopathy.  Skin:    General: Skin is dry.     Capillary Refill: Capillary refill takes less than 2 seconds.  Coloration: Skin is not jaundiced.  Neurological:     General: No focal deficit present.     Mental Status: She is alert and oriented to person, place, and time.  Psychiatric:        Mood and Affect: Mood normal.        Behavior: Behavior normal.       Assessment & Plan:   Problem List Items Addressed This Visit       Sinusitis, acute frontal - Primary    Presenting today for evaluation of persistent symptoms of frontal sinusitis.  She has previously been treated with amoxicillin x10 days.  She is using Zyrtec-D and Flonase for symptom relief. -Doxycycline 100 mg twice daily x7 days prescribed today -I recommended that she continue current supportive care measures and add a nasal saline rinse.  She can also use Tylenol as needed for pain relief      Obesity (BMI 30.0-34.9)    She is currently prescribed Mounjaro for treatment  of obesity.  She reports losing a total of 16 pounds since initiation of therapy.  Sandra Roy was refilled today.       Meds ordered this encounter  Medications   doxycycline (VIBRA-TABS) 100 MG tablet    Sig: Take 1 tablet (100 mg total) by mouth 2 (two) times daily for 7 days.    Dispense:  14 tablet    Refill:  0   tirzepatide (MOUNJARO) 5 MG/0.5ML Pen    Sig: Inject 5 mg into the skin once a week for 12 doses.    Dispense:  6 mL    Refill:  0   Johnette Abraham, MD

## 2022-03-03 NOTE — Assessment & Plan Note (Signed)
She is currently prescribed Mounjaro for treatment of obesity.  She reports losing a total of 16 pounds since initiation of therapy.  Sandra Roy was refilled today.

## 2022-03-03 NOTE — Patient Instructions (Signed)
It was a pleasure to see you today.  Thank you for giving [place] the opportunity to be involved in your care.  Below is a brief recap of your visit and next steps.  We will plan to see you again in January  Summary I have prescribed doxycycline today for sinusitis. You should continue your current symptom treatment as well I have also refilled Mounjaro Follow up in 3 months

## 2022-04-05 ENCOUNTER — Telehealth: Payer: Medicare Other | Admitting: Physician Assistant

## 2022-04-05 DIAGNOSIS — U071 COVID-19: Secondary | ICD-10-CM

## 2022-04-05 MED ORDER — NIRMATRELVIR/RITONAVIR (PAXLOVID)TABLET: 3.0000 | ORAL_TABLET | Freq: Two times a day (BID) | ORAL | 0 refills | Status: AC

## 2022-04-05 NOTE — Progress Notes (Signed)
Virtual Visit Consent   Sandra Roy, you are scheduled for a virtual visit with a [facility] provider today. Just as with appointments in the office, your consent must be obtained to participate. Your consent will be active for this visit and any virtual visit you may have with one of our providers in the next 365 days. If you have a MyChart account, a copy of this consent can be sent to you electronically.  As this is a virtual visit, video technology does not allow for your provider to perform a traditional examination. This may limit your provider's ability to fully assess your condition. If your provider identifies any concerns that need to be evaluated in person or the need to arrange testing (such as labs, EKG, etc.), we will make arrangements to do so. Although advances in technology are sophisticated, we cannot ensure that it will always work on either your end or our end. If the connection with a video visit is poor, the visit may have to be switched to a telephone visit. With either a video or telephone visit, we are not always able to ensure that we have a secure connection.  By engaging in this virtual visit, you consent to the provision of healthcare and authorize for your insurance to be billed (if applicable) for the services provided during this visit. Depending on your insurance coverage, you may receive a charge related to this service.  I need to obtain your verbal consent now. Are you willing to proceed with your visit today? Chrystie [name] has provided verbal consent on 04/05/2022 for a virtual visit (video or telephone). Mar Daring, PA-C  Date: 04/05/2022 2:31 PM  Virtual Visit via Video Note   I, Mar Daring, connected with  Sandra Roy  ([id number], October 26, 1955) on 04/05/22 at  2:30 PM EST by a video-enabled telemedicine application and verified that I am speaking with the correct person using two identifiers.  Location: Patient:  Virtual Visit Location Patient: Home Provider: Virtual Visit Location Provider: Home Office   I discussed the limitations of evaluation and management by telemedicine and the availability of in person appointments. The patient expressed understanding and agreed to proceed.    History of Present Illness: Sandra Roy is a 66 y.o. who identifies as a female who was assigned female at birth, and is being seen today for Covid 36.  HPI: URI  This is a new problem. The current episode started yesterday (Symptoms started yesterday; tested positive on at home test today; Husband had Covid last week). The problem has been gradually worsening. There has been no fever. Associated symptoms include congestion, coughing, headaches and rhinorrhea.     Problems:  Patient Active Problem List   Diagnosis Date Noted   Sinusitis, acute frontal 03/03/2022   Prediabetes 01/08/2022   Hyperlipidemia 01/08/2022   Need for influenza vaccination 01/08/2022   Obesity (BMI 30.0-34.9) 12/03/2021   Elevated blood pressure reading 12/03/2021   Preventative health care 12/03/2021   Superficial vein thrombosis 11/06/2020   Osteoarthritis of right knee 02/12/2018   S/P knee replacement 02/12/2018   History of colonic polyps 06/26/2014   Anxiety 06/26/2014   Dyspareunia 06/22/2013   Vaginal atrophy 06/22/2013   Factor 5 Leiden mutation, heterozygous ([place]) 06/21/2012   Osteopenia    Hypothyroidism due to Hashimoto's thyroiditis     Allergies:  Allergies  Allergen Reactions   Adhesive [Tape]     blisters   Aminoquinolines Other (See Comments)  Unknown   Azithromycin Other (See Comments)    Unknown   Bactrim [Sulfamethoxazole-Trimethoprim] Other (See Comments)    Unknown   Clarithromycin Other (See Comments)    UTI   Naproxen Sodium Other (See Comments)    Acid reflux   Other Swelling    Monocryl stitch 5-0 Plain Catgut suture   Vicodin [Hydrocodone-Acetaminophen] Nausea And Vomiting    Medications:  Current Outpatient Medications:    nirmatrelvir/ritonavir (PAXLOVID) 20 x 150 MG & 10 x '100MG'$  TABS, Take 3 tablets by mouth 2 (two) times daily for 5 days. (Take nirmatrelvir 150 mg two tablets twice daily for 5 days and ritonavir 100 mg one tablet twice daily for 5 days) Patient GFR is 96, Disp: 30 tablet, Rfl: 0   ALPRAZolam (XANAX) 0.25 MG tablet, Take 1 tablet (0.25 mg total) by mouth at bedtime as needed for anxiety., Disp: 10 tablet, Rfl: 0   Cholecalciferol (VITAMIN D3) 2000 units CHEW, Chew 4,000 Units by mouth daily., Disp: , Rfl:    diclofenac (VOLTAREN) 75 MG EC tablet, Take 1 tablet (75 mg total) by mouth 2 (two) times daily., Disp: 60 tablet, Rfl: 3   fluticasone (FLONASE) 50 MCG/ACT nasal spray, Place 1 spray into both nostrils daily as needed for allergies. , Disp: , Rfl:    levothyroxine (SYNTHROID) 88 MCG tablet, Take 1 tablet (88 mcg total) by mouth daily before breakfast., Disp: 90 tablet, Rfl: 1   Melatonin 5 MG CHEW, Chew 2 tablets by mouth at bedtime., Disp: , Rfl:    Multiple Vitamins-Minerals (MULTIVITAMIN PO), Take 3 tablets by mouth daily., Disp: , Rfl:    omeprazole (PRILOSEC) 40 MG capsule, Take 1 capsule (40 mg total) by mouth daily., Disp: 90 capsule, Rfl: 1   tirzepatide (MOUNJARO) 5 MG/0.5ML Pen, Inject 5 mg into the skin once a week for 12 doses., Disp: 6 mL, Rfl: 0  Observations/Objective: Patient is well-developed, well-nourished in no acute distress.  Resting comfortably at home.  Head is normocephalic, atraumatic.  No labored breathing.  Speech is clear and coherent with logical content.  Patient is alert and oriented at baseline.    Assessment and Plan: 1. COVID-19 - nirmatrelvir/ritonavir (PAXLOVID) 20 x 150 MG & 10 x '100MG'$  TABS; Take 3 tablets by mouth 2 (two) times daily for 5 days. (Take nirmatrelvir 150 mg two tablets twice daily for 5 days and ritonavir 100 mg one tablet twice daily for 5 days) Patient GFR is 96  Dispense: 30  tablet; Refill: 0 - MyChart COVID-19 home monitoring program; Future  - Continue OTC symptomatic management of choice - Will send OTC vitamins and supplement information through AVS - Paxlovid prescribed - Patient enrolled in MyChart symptom monitoring - Push fluids - Rest as needed - Discussed return precautions and when to seek in-person evaluation, sent via [facility] as well   Follow Up Instructions: I discussed the assessment and treatment plan with the patient. The patient was provided an opportunity to ask questions and all were answered. The patient agreed with the plan and demonstrated an understanding of the instructions.  A copy of instructions were sent to the patient via MyChart unless otherwise noted below.    The patient was advised to call back or seek an in-person evaluation if the symptoms worsen or if the condition fails to improve as anticipated.  Time:  I spent 8 minutes with the patient via telehealth technology discussing the above problems/concerns.    Mar Daring, PA-C

## 2022-04-05 NOTE — Patient Instructions (Signed)
Sandra Roy, thank you for joining Mar Daring, PA-C for today's virtual visit.  While this provider is not your primary care provider (PCP), if your PCP is located in our provider database this encounter information will be shared with them immediately following your visit.   [facility] account gives you access to today's visit and all your visits, tests, and labs performed at [facility] " click here if you don't have a [facility] account or go to [email or web address]  Consent: (Patient) Sandra Roy provided verbal consent for this virtual visit at the beginning of the encounter.  Current Medications:  Current Outpatient Medications:    nirmatrelvir/ritonavir (PAXLOVID) 20 x 150 MG & 10 x '100MG'$  TABS, Take 3 tablets by mouth 2 (two) times daily for 5 days. (Take nirmatrelvir 150 mg two tablets twice daily for 5 days and ritonavir 100 mg one tablet twice daily for 5 days) Patient GFR is 96, Disp: 30 tablet, Rfl: 0   ALPRAZolam (XANAX) 0.25 MG tablet, Take 1 tablet (0.25 mg total) by mouth at bedtime as needed for anxiety., Disp: 10 tablet, Rfl: 0   Cholecalciferol (VITAMIN D3) 2000 units CHEW, Chew 4,000 Units by mouth daily., Disp: , Rfl:    diclofenac (VOLTAREN) 75 MG EC tablet, Take 1 tablet (75 mg total) by mouth 2 (two) times daily., Disp: 60 tablet, Rfl: 3   fluticasone (FLONASE) 50 MCG/ACT nasal spray, Place 1 spray into both nostrils daily as needed for allergies. , Disp: , Rfl:    levothyroxine (SYNTHROID) 88 MCG tablet, Take 1 tablet (88 mcg total) by mouth daily before breakfast., Disp: 90 tablet, Rfl: 1   Melatonin 5 MG CHEW, Chew 2 tablets by mouth at bedtime., Disp: , Rfl:    Multiple Vitamins-Minerals (MULTIVITAMIN PO), Take 3 tablets by mouth daily., Disp: , Rfl:    omeprazole (PRILOSEC) 40 MG capsule, Take 1 capsule (40 mg total) by mouth daily., Disp: 90 capsule, Rfl: 1   tirzepatide (MOUNJARO) 5 MG/0.5ML Pen,  Inject 5 mg into the skin once a week for 12 doses., Disp: 6 mL, Rfl: 0   Medications ordered in this encounter:  Meds ordered this encounter  Medications   nirmatrelvir/ritonavir (PAXLOVID) 20 x 150 MG & 10 x '100MG'$  TABS    Sig: Take 3 tablets by mouth 2 (two) times daily for 5 days. (Take nirmatrelvir 150 mg two tablets twice daily for 5 days and ritonavir 100 mg one tablet twice daily for 5 days) Patient GFR is 96    Dispense:  30 tablet    Refill:  0    Order Specific Question:   Supervising Provider    Answer:   Chase Picket [id number]     *If you need refills on other medications prior to your next appointment, please contact your pharmacy*  Follow-Up: Call back or seek an in-person evaluation if the symptoms worsen or if the condition fails to improve as anticipated.  [place] [phone]  Other Instructions  Can take to lessen severity: Vit C '500mg'$  twice daily Quercertin 250-'500mg'$  twice daily Zinc 75-'100mg'$  daily Melatonin 3-6 mg at bedtime Vit D3 1000-2000 IU daily Aspirin 81 mg daily with food Optional: Famotidine '20mg'$  daily Also can add tylenol/ibuprofen as needed for fevers and body aches May add Mucinex or Mucinex DM as needed for cough/congestion   COVID-19 COVID-19, or coronavirus disease 2019, is an infection that is caused by a new (novel) coronavirus called SARS-CoV-2. COVID-19  can cause many symptoms. In some people, the virus may not cause any symptoms. In others, it may cause mild or severe symptoms. Some people with severe infection develop severe disease. What are the causes? This illness is caused by a virus. The virus may be in the air as tiny specks of fluid (aerosols) or droplets, or it may be on surfaces. You may catch the virus by: Breathing in droplets from an infected person. Droplets can be spread by a person breathing, speaking, singing, coughing, or sneezing. Touching something, like a table or a doorknob, that has virus on  it (is contaminated) and then touching your mouth, nose, or eyes. What increases the risk? Risk for infection: You are more likely to get infected with the COVID-19 virus if: You are within 6 ft (1.8 m) of a person with COVID-19 for 15 minutes or longer. You are providing care for a person who is infected with COVID-19. You are in close personal contact with other people. Close personal contact includes hugging, kissing, or sharing eating or drinking utensils. Risk for serious illness caused by COVID-19: You are more likely to get seriously ill from the COVID-19 virus if: You have cancer. You have a long-term (chronic) disease, such as: Chronic lung disease. This includes pulmonary embolism, chronic obstructive pulmonary disease, and cystic fibrosis. Long-term disease that lowers your body's ability to fight infection (immunocompromise). Serious cardiac conditions, such as heart failure, coronary artery disease, or cardiomyopathy. Diabetes. Chronic kidney disease. Liver diseases. These include cirrhosis, nonalcoholic fatty liver disease, alcoholic liver disease, or autoimmune hepatitis. You have obesity. You are pregnant or were recently pregnant. You have sickle cell disease. What are the signs or symptoms? Symptoms of this condition can range from mild to severe. Symptoms may appear any time from 2 to 14 days after being exposed to the virus. They include: Fever or chills. Shortness of breath or trouble breathing. Feeling tired or very tired. Headaches, body aches, or muscle aches. Runny or stuffy nose, sneezing, coughing, or sore throat. New loss of taste or smell. This is rare. Some people may also have stomach problems, such as nausea, vomiting, or diarrhea. Other people may not have any symptoms of COVID-19. How is this diagnosed? This condition may be diagnosed by testing samples to check for the COVID-19 virus. The most common tests are the PCR test and the antigen test. Tests  may be done in the lab or at home. They include: Using a swab to take a sample of fluid from the back of your nose and throat (nasopharyngeal fluid), from your nose, or from your throat. Testing a sample of saliva from your mouth. Testing a sample of coughed-up mucus from your lungs (sputum). How is this treated? Treatment for COVID-19 infection depends on the severity of the condition. Mild symptoms can be managed at home with rest, fluids, and over-the-counter medicines. Serious symptoms may be treated in a hospital intensive care unit (ICU). Treatment in the ICU may include: Supplemental oxygen. Extra oxygen is given through a tube in the nose, a face mask, or a hood. Medicines. These may include: Antivirals, such as monoclonal antibodies. These help your body fight off certain viruses that can cause disease. Anti-inflammatories, such as corticosteroids. These reduce inflammation and suppress the immune system. Antithrombotics. These prevent or treat blood clots, if they develop. Convalescent plasma. This helps boost your immune system, if you have an underlying immunosuppressive condition or are getting immunosuppressive treatments. Prone positioning. This means you will lie on  your stomach. This helps oxygen to get into your lungs. Infection control measures. If you are at risk for more serious illness caused by COVID-19, your health care provider may prescribe two long-acting monoclonal antibodies, given together every 6 months. How is this prevented? To protect yourself: Use preventive medicine (pre-exposure prophylaxis). You may get pre-exposure prophylaxis if you have moderate or severe immunocompromise. Get vaccinated. Anyone 23 months old or older who meets guidelines can get a COVID-19 vaccine or vaccine series. This includes people who are pregnant or making breast milk (lactating). Get an added dose of COVID-19 vaccine after your first vaccine or vaccine series if you have moderate  to severe immunocompromise. This applies if you have had a solid organ transplant or have been diagnosed with an immunocompromising condition. You should get the added dose 4 weeks after you got the first COVID-19 vaccine or vaccine series. If you get an mRNA vaccine, you will need a 3-dose primary series. If you get the J&J/Janssen vaccine, you will need a 2-dose primary series, with the second dose being an mRNA vaccine. Talk to your health care provider about getting experimental monoclonal antibodies. This treatment is approved under emergency use authorization to prevent severe illness before or after being exposed to the COVID-19 virus. You may be given monoclonal antibodies if: You have moderate or severe immunocompromise. This includes treatments that lower your immune response. People with immunocompromise may not develop protection against COVID-19 when they are vaccinated. You cannot be vaccinated. You may not get a vaccine if you have a severe allergic reaction to the vaccine or its components. You are not fully vaccinated. You are in a facility where COVID-19 is present and: Are in close contact with a person who is infected with the COVID-19 virus. Are at high risk of being exposed to the COVID-19 virus. You are at risk of illness from new variants of the COVID-19 virus. To protect others: If you have symptoms of COVID-19, take steps to prevent the virus from spreading to others. Stay home. Leave your house only to get medical care. Do not use public transit, if possible. Do not travel while you are sick. Wash your hands often with soap and water for at least 20 seconds. If soap and water are not available, use alcohol-based hand sanitizer. Make sure that all people in your household wash their hands well and often. Cough or sneeze into a tissue or your sleeve or elbow. Do not cough or sneeze into your hand or into the air. Where to find more information Centers for Disease Control  and Prevention: CharmCourses.be World Health Organization: https://www.castaneda.info/ Get help right away if: You have trouble breathing. You have pain or pressure in your chest. You are confused. You have bluish lips and fingernails. You have trouble waking from sleep. You have symptoms that get worse. These symptoms may be an emergency. Get help right away. Call 911. Do not wait to see if the symptoms will go away. Do not drive yourself to the hospital. Summary COVID-19 is an infection that is caused by a new coronavirus. Sometimes, there are no symptoms. Other times, symptoms range from mild to severe. Some people with a severe COVID-19 infection develop severe disease. The virus that causes COVID-19 can spread from person to person through droplets or aerosols from breathing, speaking, singing, coughing, or sneezing. Mild symptoms of COVID-19 can be managed at home with rest, fluids, and over-the-counter medicines. This information is not intended to replace advice given to you  by your health care provider. Make sure you discuss any questions you have with your health care provider. Document Revised: 03/19/2021 Document Reviewed: 03/21/2021 Elsevier Patient Education  [place].    If you have been instructed to have an in-person evaluation today at a local Urgent Care facility, please use the link below. It will take you to a list of all of our available [facility], including address, phone number and hours of operation. Please do not delay care.  [facility]  If you or a family member do not have a primary care provider, use the link below to schedule a visit and establish care. When you choose a [facility] primary care physician or advanced practice provider, you gain a long-term partner in health. Find a Primary Care Provider  Learn more about [facility] in-office and virtual care options: [facility]

## 2022-04-28 ENCOUNTER — Ambulatory Visit (INDEPENDENT_AMBULATORY_CARE_PROVIDER_SITE_OTHER): Payer: Medicare Other | Admitting: Internal Medicine

## 2022-04-28 ENCOUNTER — Encounter: Payer: Self-pay | Admitting: Internal Medicine

## 2022-04-28 VITALS — BP 139/71 | HR 88 | Ht 64.0 in | Wt 180.2 lb

## 2022-04-28 DIAGNOSIS — E038 Other specified hypothyroidism: Secondary | ICD-10-CM

## 2022-04-28 DIAGNOSIS — E063 Autoimmune thyroiditis: Secondary | ICD-10-CM | POA: Diagnosis not present

## 2022-04-28 DIAGNOSIS — F419 Anxiety disorder, unspecified: Secondary | ICD-10-CM | POA: Diagnosis not present

## 2022-04-28 DIAGNOSIS — E782 Mixed hyperlipidemia: Secondary | ICD-10-CM | POA: Diagnosis not present

## 2022-04-28 DIAGNOSIS — M1711 Unilateral primary osteoarthritis, right knee: Secondary | ICD-10-CM | POA: Diagnosis not present

## 2022-04-28 DIAGNOSIS — E669 Obesity, unspecified: Secondary | ICD-10-CM

## 2022-04-28 DIAGNOSIS — K219 Gastro-esophageal reflux disease without esophagitis: Secondary | ICD-10-CM

## 2022-04-28 DIAGNOSIS — E66811 Obesity, class 1: Secondary | ICD-10-CM

## 2022-04-28 MED ORDER — LEVOTHYROXINE SODIUM 88 MCG PO TABS: 88.0000 ug | ORAL_TABLET | Freq: Every day | ORAL | 1 refills | Status: DC

## 2022-04-28 MED ORDER — OMEPRAZOLE 40 MG PO CPDR: 40.0000 mg | DELAYED_RELEASE_CAPSULE | Freq: Every day | ORAL | 1 refills | Status: DC

## 2022-04-28 MED ORDER — TIRZEPATIDE 5 MG/0.5ML ~~LOC~~ SOAJ: 5.0000 mg | SUBCUTANEOUS | 0 refills | Status: AC

## 2022-04-28 MED ORDER — DICLOFENAC SODIUM 75 MG PO TBEC: 75.0000 mg | DELAYED_RELEASE_TABLET | Freq: Two times a day (BID) | ORAL | 3 refills | Status: DC

## 2022-04-28 NOTE — Progress Notes (Signed)
Established Patient Office Visit  Subjective   Patient ID: Sandra Roy, female    DOB: 1955/11/16  Age: 67 y.o. MRN: [id number]  Chief Complaint  Patient presents with   Weight Loss    Follow up   [name] returns to care today for follow up.  She was last seen by me for an acute visit on 03/03/22 for sinusitis.  Previously seen for routine follow-up on 02/04/22.  There have been no acute interval events since her last appointment.  She reports feeling well today.  Her weight today is 180 lbs.  Her weight is down 18 lbs since starting Mounjaro in late September.  Past Medical History:  Diagnosis Date   Adnexal mass 05/2000   Left   Anemia    history of   Anxiety    Autoimmune thyroiditis    Carpal tunnel syndrome    Right   Depression    Factor V Leiden mutation ([place])    [place]   Foot fracture, left 05/06/2017   Hashimoto's disease    Hemorrhoids 04/25/2004   Mild external and internal noted on colonoscopy   History of colon polyps 04/25/2004   History of gastritis 04/25/2004   History of hiatal hernia 06/21/2002   History of menorrhagia    Hypoglycemic syndrome    Hypothyroidism    Lipoma    Bilateral ankles   NSVD (normal spontaneous vaginal delivery)    x3   OA (osteoarthritis)    Osteopenia    Osteopenia    Mild   PONV (postoperative nausea and vomiting)    Hysterectomy surgery blood pressure dropped   Thyroid nodule 03/23/2013   Bilateral 26m , noted on [place]  Vasovagal syncope    salty drinks help (gatorade)   Vitamin D deficiency    Past Surgical History:  Procedure Laterality Date   ABDOMINAL HYSTERECTOMY  2003   TAH/BSO   ANKLE SURGERY Bilateral 09/18/2017   excision lipomas    BLEPHAROPLASTY Bilateral    CARPAL TUNNEL RELEASE Right 2003   COLONOSCOPY     DILATION AND CURETTAGE OF UTERUS     ENDOMETRIAL ABLATION     KNEE ARTHROSCOPY  2010   RIGHT   LIPOMA EXCISION Bilateral    2019   PELVIC LAPAROSCOPY     LSO   TOTAL KNEE  ARTHROPLASTY Right 02/12/2018   Procedure: RIGHT TOTAL KNEE ARTHROPLASTY;  Surgeon: CSydnee Cabal MD;  Location: [facility];  Service: Orthopedics;  Laterality: Right;   TUBAL LIGATION  1976   UPPER GI ENDOSCOPY     Social History   Tobacco Use   Smoking status: Former    Types: Cigarettes    Quit date: 06/17/1981    Years since quitting: 40.9   Smokeless tobacco: Never  Vaping Use   Vaping Use: Never used  Substance Use Topics   Alcohol use: No    Alcohol/week: 0.0 standard drinks of alcohol   Drug use: No   Family History  Problem Relation Age of Onset   Diabetes Father    Heart disease Father    Hypertension Sister    Cancer Sister        LUNG  (SMOKER)   Diabetes Brother    Hypertension Brother    Breast cancer Neg Hx    Allergies  Allergen Reactions   Adhesive [Tape]     blisters   Aminoquinolines Other (See Comments)    Unknown   Azithromycin Other (See Comments)    Unknown  Bactrim [Sulfamethoxazole-Trimethoprim] Other (See Comments)    Unknown   Clarithromycin Other (See Comments)    UTI   Naproxen Sodium Other (See Comments)    Acid reflux   Other Swelling    Monocryl stitch 5-0 Plain Catgut suture   Vicodin [Hydrocodone-Acetaminophen] Nausea And Vomiting   Review of Systems  Constitutional:  Negative for chills and fever.  HENT:  Negative for sore throat.   Respiratory:  Negative for cough and shortness of breath.   Cardiovascular:  Negative for chest pain, palpitations and leg swelling.  Gastrointestinal:  Negative for abdominal pain, blood in stool, constipation, diarrhea, nausea and vomiting.  Genitourinary:  Negative for dysuria and hematuria.  Musculoskeletal:  Negative for myalgias.  Skin:  Negative for itching and rash.  Neurological:  Negative for dizziness and headaches.  Psychiatric/Behavioral:  Negative for depression and suicidal ideas.      Objective:     BP 139/71   Pulse 88   Ht '5\' 4"'$  (1.626 m)   Wt 180 lb 3.2 oz (81.7 kg)    SpO2 95%   BMI 30.93 kg/m  BP Readings from Last 3 Encounters:  04/28/22 139/71  03/03/22 113/71  02/10/22 (!) 149/93   Physical Exam Vitals reviewed.  Constitutional:      General: She is not in acute distress.    Appearance: Normal appearance. She is not toxic-appearing.  HENT:     Head: Normocephalic and atraumatic.     Right Ear: External ear normal.     Left Ear: External ear normal.     Nose: Nose normal. No congestion or rhinorrhea.     Mouth/Throat:     Mouth: Mucous membranes are moist.     Pharynx: Oropharynx is clear. No oropharyngeal exudate or posterior oropharyngeal erythema.  Eyes:     General: No scleral icterus.    Extraocular Movements: Extraocular movements intact.     Conjunctiva/sclera: Conjunctivae normal.     Pupils: Pupils are equal, round, and reactive to light.  Cardiovascular:     Rate and Rhythm: Normal rate and regular rhythm.     Pulses: Normal pulses.     Heart sounds: Normal heart sounds. No murmur heard.    No friction rub. No gallop.  Pulmonary:     Effort: Pulmonary effort is normal.     Breath sounds: Normal breath sounds. No wheezing, rhonchi or rales.  Abdominal:     General: Abdomen is flat. Bowel sounds are normal. There is no distension.     Palpations: Abdomen is soft.     Tenderness: There is no abdominal tenderness.  Musculoskeletal:        General: No swelling. Normal range of motion.     Cervical back: Normal range of motion.     Right lower leg: No edema.     Left lower leg: No edema.  Lymphadenopathy:     Cervical: No cervical adenopathy.  Skin:    General: Skin is warm and dry.     Capillary Refill: Capillary refill takes less than 2 seconds.     Coloration: Skin is not jaundiced.  Neurological:     General: No focal deficit present.     Mental Status: She is alert and oriented to person, place, and time.  Psychiatric:        Mood and Affect: Mood normal.        Behavior: Behavior normal.   Last CBC Lab Results   Component Value Date   WBC 6.0 02/10/2022  HGB 13.3 02/10/2022   HCT 40.1 02/10/2022   MCV 95 02/10/2022   MCH 31.4 02/10/2022   RDW 13.1 02/10/2022   PLT 247 69/48/5462   Last metabolic panel Lab Results  Component Value Date   GLUCOSE CANCELED 02/10/2022   NA 146 (H) 02/10/2022   K CANCELED 02/10/2022   CL 108 (H) 02/10/2022   CO2 20 02/10/2022   BUN 11 02/10/2022   CREATININE 0.66 02/10/2022   EGFR 96 02/10/2022   CALCIUM 9.0 02/10/2022   PROT 6.8 02/10/2022   ALBUMIN 4.5 02/10/2022   LABGLOB 2.3 02/10/2022   AGRATIO 2.0 02/10/2022   BILITOT <0.2 02/10/2022   ALKPHOS 105 02/10/2022   AST 20 02/10/2022   ALT 23 02/10/2022   ANIONGAP 7 02/13/2018   Last lipids Lab Results  Component Value Date   CHOL 178 04/28/2022   HDL 51 04/28/2022   LDLCALC 111 (H) 04/28/2022   TRIG 89 04/28/2022   CHOLHDL 3.5 04/28/2022   Last hemoglobin A1c Lab Results  Component Value Date   HGBA1C 5.9 (H) 12/03/2021   Last thyroid functions Lab Results  Component Value Date   TSH 1.400 12/03/2021   T4TOTAL 7.6 06/18/2011   Last vitamin D Lab Results  Component Value Date   VD25OH 46.3 12/03/2021   Last vitamin B12 and Folate Lab Results  Component Value Date   VITAMINB12 329 12/03/2021   FOLATE 10.3 12/03/2021   The 10-year ASCVD risk score (Arnett DK, et al., 2019) is: 8.7%    Assessment & Plan:   Problem List Items Addressed This Visit       Obesity (BMI 30.0-34.9)    BMI today is 30.9.  She has lost 18 pounds since starting [place] in late September.  She is pleased with her progress and would like to lose an additional 20-30 pounds. [name] has been refilled today      Hyperlipidemia    Cholesterol panel last updated in August 2023.  Total cholesterol 183 and LDL 111.  In the setting of prediabetes with a moderately increased 10-year ASCVD risk score, I have previously recommended statin therapy.  Ms. Dall declined that time, preferring to focus on weight  loss and to repeat her lipid panel after several months.  -Repeat lipid panel ordered today      Return in about 3 months (around 07/28/2022).    Johnette Abraham, MD

## 2022-04-28 NOTE — Patient Instructions (Signed)
It was a pleasure to see you today.  Thank you for giving [place] the opportunity to be involved in your care.  Below is a brief recap of your visit and next steps.  We will plan to see you again in 3 months.  Summary Medications have been refilled today. I am pleased with your progress. Please be sure to drink plenty of water and increase your daily fiber intake Repeat cholesterol panel today Follow up in 3 months

## 2022-04-29 ENCOUNTER — Telehealth: Payer: Self-pay

## 2022-04-29 LAB — LIPID PANEL
Chol/HDL Ratio: 3.5 ratio (ref 0.0–4.4)
Cholesterol, Total: 178 mg/dL (ref 100–199)
HDL: 51 mg/dL (ref >39)
LDL Chol Calc (NIH): 111 mg/dL — ABNORMAL HIGH (ref 0–99)
Triglycerides: 89 mg/dL (ref 0–149)
VLDL Cholesterol Cal: 16 mg/dL (ref 5–40)

## 2022-04-30 ENCOUNTER — Telehealth: Payer: Self-pay | Admitting: Internal Medicine

## 2022-04-30 NOTE — Telephone Encounter (Signed)
Rosaria Ferries spoke with the patient today and she was given mounjaro in Nov by Doren Custard and it was refilled yesterday but now her insurance is denying it since she is not diabetic. She does not want ozempic/wegovy. I told Rosaria Ferries that medicare did not cover weight loss drugs but phentermine is around $37 cash price and we have medicare pts pay out of pocket for this. Rosaria Ferries called patient and she agreed on this option. Rosaria Ferries told patient we would try to get it called in today to [place] Doren Custard is out tomorrow as well) Please advise

## 2022-05-04 NOTE — Assessment & Plan Note (Signed)
BMI today is 30.9.  She has lost 18 pounds since starting [place] in late September.  She is pleased with her progress and would like to lose an additional 20-30 pounds. Sandra Roy has been refilled today

## 2022-05-04 NOTE — Assessment & Plan Note (Signed)
Cholesterol panel last updated in August 2023.  Total cholesterol 183 and LDL 111.  In the setting of prediabetes with a moderately increased 10-year ASCVD risk score, I have previously recommended statin therapy.  Sandra Roy declined that time, preferring to focus on weight loss and to repeat her lipid panel after several months.  -Repeat lipid panel ordered today

## 2022-05-06 ENCOUNTER — Ambulatory Visit: Payer: Medicare Other | Admitting: Internal Medicine

## 2022-05-07 NOTE — Telephone Encounter (Signed)
I called and spoke with the patient.  She is interested in starting [place] when she returns from [place].  She will contact our office.  Tery Sanfilippo.

## 2022-05-12 ENCOUNTER — Telehealth: Payer: Self-pay | Admitting: Internal Medicine

## 2022-05-12 NOTE — Telephone Encounter (Signed)
Patient called and said Dr Doren Custard call her last week when she was in [place].  She could not hear the call.  She would like for him to call her back.  She is back in town now.  I told her he was not in the office today and it would be tomorrow when he gets back.  She is asking about weight loss medicine.

## 2022-05-13 ENCOUNTER — Other Ambulatory Visit: Payer: Self-pay

## 2022-05-13 DIAGNOSIS — E669 Obesity, unspecified: Secondary | ICD-10-CM

## 2022-05-13 MED ORDER — ZEPBOUND 2.5 MG/0.5ML ~~LOC~~ SOAJ: 2.5000 mg | SUBCUTANEOUS | 0 refills | Status: DC

## 2022-05-13 NOTE — Telephone Encounter (Signed)
Order for zepbound planed.

## 2022-05-14 NOTE — Telephone Encounter (Signed)
Zepbound was denied so reached out to patient to see if she would like to try [facility] and patient declined at this time and stated that she was going to watch her diet and if she changes her mind she will let us know.

## 2022-06-04 DIAGNOSIS — M25531 Pain in right wrist: Secondary | ICD-10-CM | POA: Diagnosis not present

## 2022-06-04 DIAGNOSIS — M79642 Pain in left hand: Secondary | ICD-10-CM | POA: Diagnosis not present

## 2022-06-04 DIAGNOSIS — M25532 Pain in left wrist: Secondary | ICD-10-CM | POA: Diagnosis not present

## 2022-06-04 DIAGNOSIS — M65839 Other synovitis and tenosynovitis, unspecified forearm: Secondary | ICD-10-CM | POA: Diagnosis not present

## 2022-06-04 DIAGNOSIS — M79641 Pain in right hand: Secondary | ICD-10-CM | POA: Diagnosis not present

## 2022-06-04 DIAGNOSIS — R52 Pain, unspecified: Secondary | ICD-10-CM | POA: Diagnosis not present

## 2022-06-16 DIAGNOSIS — M65831 Other synovitis and tenosynovitis, right forearm: Secondary | ICD-10-CM | POA: Diagnosis not present

## 2022-06-26 ENCOUNTER — Other Ambulatory Visit: Payer: Self-pay | Admitting: Internal Medicine

## 2022-06-26 DIAGNOSIS — F419 Anxiety disorder, unspecified: Secondary | ICD-10-CM

## 2022-06-30 DIAGNOSIS — M25531 Pain in right wrist: Secondary | ICD-10-CM | POA: Diagnosis not present

## 2022-07-07 DIAGNOSIS — R2231 Localized swelling, mass and lump, right upper limb: Secondary | ICD-10-CM | POA: Diagnosis not present

## 2022-07-07 DIAGNOSIS — M65831 Other synovitis and tenosynovitis, right forearm: Secondary | ICD-10-CM | POA: Diagnosis not present

## 2022-07-07 DIAGNOSIS — M13842 Other specified arthritis, left hand: Secondary | ICD-10-CM | POA: Diagnosis not present

## 2022-07-14 ENCOUNTER — Encounter: Payer: Self-pay | Admitting: Internal Medicine

## 2022-07-14 ENCOUNTER — Ambulatory Visit (INDEPENDENT_AMBULATORY_CARE_PROVIDER_SITE_OTHER): Payer: Medicare Other | Admitting: Internal Medicine

## 2022-07-14 VITALS — BP 132/72 | HR 82 | Ht 64.0 in | Wt 181.6 lb

## 2022-07-14 DIAGNOSIS — J019 Acute sinusitis, unspecified: Secondary | ICD-10-CM | POA: Diagnosis not present

## 2022-07-14 DIAGNOSIS — R350 Frequency of micturition: Secondary | ICD-10-CM | POA: Diagnosis not present

## 2022-07-14 DIAGNOSIS — R300 Dysuria: Secondary | ICD-10-CM

## 2022-07-14 DIAGNOSIS — B9689 Other specified bacterial agents as the cause of diseases classified elsewhere: Secondary | ICD-10-CM | POA: Insufficient documentation

## 2022-07-14 DIAGNOSIS — J329 Chronic sinusitis, unspecified: Secondary | ICD-10-CM

## 2022-07-14 LAB — POCT URINALYSIS DIP (CLINITEK)
Bilirubin, UA: NEGATIVE
Blood, UA: NEGATIVE
Glucose, UA: NEGATIVE mg/dL
Ketones, POC UA: NEGATIVE mg/dL
Nitrite, UA: NEGATIVE
POC PROTEIN,UA: NEGATIVE
Spec Grav, UA: 1.015 (ref 1.010–1.025)
Urobilinogen, UA: 0.2 E.U./dL
pH, UA: 6.5 (ref 5.0–8.0)

## 2022-07-14 MED ORDER — AMOXICILLIN 500 MG PO CAPS: 500.0000 mg | ORAL_CAPSULE | Freq: Three times a day (TID) | ORAL | 0 refills | Status: DC

## 2022-07-14 MED ORDER — PROMETHAZINE-DM 6.25-15 MG/5ML PO SYRP: 2.5000 mL | ORAL_SOLUTION | Freq: Four times a day (QID) | ORAL | 0 refills | Status: DC | PRN

## 2022-07-14 NOTE — Patient Instructions (Signed)
It was a pleasure to see you today.  Thank you for giving [place] the opportunity to be involved in your care.  Below is a brief recap of your visit and next steps.  We will plan to see you again on 4/22.  Summary Amoxicllin x 5 days prescribed for sinusitis Promethazine-DM added for cough relief Follow up later this month

## 2022-07-14 NOTE — Progress Notes (Signed)
Acute Office Visit  Subjective:     Patient ID: Sandra Roy, female    DOB: 1955/07/02, 67 y.o.   MRN: [id number]  Chief Complaint  Patient presents with   Sinusitis    Headache started 06/30/22 behind eyes, coughing stuff up   Sandra Roy presents today for an acute visit endorsing sinus pain/pressure and cloudy nasal congestion x 1 week.  Symptoms are worsening overall despite using over-the-counter sinus relief medications.  She denies fever/chills.  She additionally endorses a dry cough that keeps her awake at night.  Lastly, Sandra Roy endorses a 2-week history of increased urinary frequency.  She denies dysuria, fever/chills, and has not appreciated any discoloration or foul odor of urine.  Review of Systems  Constitutional:  Negative for chills and fever.  HENT:  Positive for congestion and sinus pain.   Respiratory:  Positive for cough. Negative for sputum production.   Genitourinary:  Positive for frequency.  All other systems reviewed and are negative.     Objective:    BP 132/72   Pulse 82   Ht 5\' 4"  (1.626 m)   Wt 181 lb 9.6 oz (82.4 kg)   SpO2 92%   BMI 31.17 kg/m  BP Readings from Last 3 Encounters:  07/14/22 132/72  04/28/22 139/71  03/03/22 113/71   Physical Exam Vitals reviewed.  Constitutional:      General: She is not in acute distress.    Appearance: Normal appearance. She is obese. She is not toxic-appearing.  HENT:     Head: Normocephalic and atraumatic.     Right Ear: External ear normal.     Left Ear: External ear normal.     Nose: Congestion present. No rhinorrhea.     Comments: TTP over maxillary sinuses    Mouth/Throat:     Mouth: Mucous membranes are moist.     Pharynx: Oropharynx is clear. No oropharyngeal exudate or posterior oropharyngeal erythema.  Eyes:     General: No scleral icterus.    Extraocular Movements: Extraocular movements intact.     Conjunctiva/sclera: Conjunctivae normal.     Pupils: Pupils are equal, round,  and reactive to light.  Cardiovascular:     Rate and Rhythm: Normal rate and regular rhythm.     Pulses: Normal pulses.     Heart sounds: Normal heart sounds. No murmur heard.    No friction rub. No gallop.  Pulmonary:     Effort: Pulmonary effort is normal.     Breath sounds: Normal breath sounds. No wheezing, rhonchi or rales.  Abdominal:     General: Abdomen is flat. Bowel sounds are normal. There is no distension.     Palpations: Abdomen is soft.     Tenderness: There is no abdominal tenderness.  Musculoskeletal:        General: No swelling. Normal range of motion.     Cervical back: Normal range of motion.     Right lower leg: No edema.     Left lower leg: No edema.  Lymphadenopathy:     Cervical: No cervical adenopathy.  Skin:    General: Skin is warm and dry.     Capillary Refill: Capillary refill takes less than 2 seconds.     Coloration: Skin is not jaundiced.  Neurological:     General: No focal deficit present.     Mental Status: She is alert and oriented to person, place, and time.  Psychiatric:        Mood and Affect:  Mood normal.        Behavior: Behavior normal.       Assessment & Plan:   Problem List Items Addressed This Visit       Acute bacterial sinusitis    Presenting today for an acute visit endorsing a 1 week history of maxillary sinus pain/pressure with cloudy colored nasal secretions.  Symptoms have not improved with OTC medications. -Amoxicillin 500 mg 3 times daily x 5 days prescribed for treatment of bacterial sinusitis      Increased urinary frequency    She endorses a 2-week history of increased urinary frequency.  Denies dysuria, hematuria, and has not appreciated foul odor or discoloration of urine. -UA pending      Meds ordered this encounter  Medications   amoxicillin (AMOXIL) 500 MG capsule    Sig: Take 1 capsule (500 mg total) by mouth 3 (three) times daily for 5 days.    Dispense:  15 capsule    Refill:  0    promethazine-dextromethorphan (PROMETHAZINE-DM) 6.25-15 MG/5ML syrup    Sig: Take 2.5 mLs by mouth 4 (four) times daily as needed for cough.    Dispense:  118 mL    Refill:  0    Return if symptoms worsen or fail to improve.  Johnette Abraham, MD

## 2022-07-14 NOTE — Assessment & Plan Note (Signed)
She endorses a 2-week history of increased urinary frequency.  Denies dysuria, hematuria, and has not appreciated foul odor or discoloration of urine. -UA pending

## 2022-07-14 NOTE — Assessment & Plan Note (Signed)
Presenting today for an acute visit endorsing a 1 week history of maxillary sinus pain/pressure with cloudy colored nasal secretions.  Symptoms have not improved with OTC medications. -Amoxicillin 500 mg 3 times daily x 5 days prescribed for treatment of bacterial sinusitis

## 2022-07-16 ENCOUNTER — Ambulatory Visit (INDEPENDENT_AMBULATORY_CARE_PROVIDER_SITE_OTHER): Payer: Medicare Other | Admitting: Internal Medicine

## 2022-07-16 ENCOUNTER — Encounter: Payer: Self-pay | Admitting: Internal Medicine

## 2022-07-16 ENCOUNTER — Telehealth: Payer: Self-pay

## 2022-07-16 VITALS — BP 145/76 | HR 90 | Ht 64.0 in | Wt 182.4 lb

## 2022-07-16 DIAGNOSIS — J0141 Acute recurrent pansinusitis: Secondary | ICD-10-CM

## 2022-07-16 MED ORDER — CEFIXIME 400 MG PO CAPS: 400.0000 mg | ORAL_CAPSULE | Freq: Every day | ORAL | 0 refills | Status: DC

## 2022-07-16 MED ORDER — BENZONATATE 200 MG PO CAPS: 200.0000 mg | ORAL_CAPSULE | Freq: Two times a day (BID) | ORAL | 0 refills | Status: DC | PRN

## 2022-07-16 NOTE — Telephone Encounter (Signed)
Tripped advised to order medication for patient

## 2022-07-16 NOTE — Progress Notes (Addendum)
Acute Office Visit  Subjective:    Patient ID: Sandra Roy, female    DOB: [birth year], 67 y.o.   MRN: [id number]  Chief Complaint  Patient presents with   Cough    Patient was seen Monday by Dr Doren Custard, she says she has gotten worse since then. She has a bad cough, shortness of breath, congestion, not sleeping, pressure in her face and pain behind her eyes.    HPI Patient is in today for complaint of persistent cough, nasal congestion, pain behind her eyes and difficulty breathing due to nasal congestion.  She was placed on amoxicillin, but states that she has had amoxicillin multiple times for dental procedures and is concerned about resistance.  She denies any fever or chills.  Denies any wheezing.  Past Medical History:  Diagnosis Date   Adnexal mass 05/2000   Left   Anemia    history of   Anxiety    Autoimmune thyroiditis    Carpal tunnel syndrome    Right   Depression    Factor V Leiden mutation    Carrier   Foot fracture, left 05/06/2017   Hashimoto's disease    Hemorrhoids 04/25/2004   Mild external and internal noted on colonoscopy   History of colon polyps 04/25/2004   History of gastritis 04/25/2004   History of hiatal hernia 06/21/2002   History of menorrhagia    Hypoglycemic syndrome    Hypothyroidism    Lipoma    Bilateral ankles   NSVD (normal spontaneous vaginal delivery)    x3   OA (osteoarthritis)    Osteopenia    Osteopenia    Mild   PONV (postoperative nausea and vomiting)    Hysterectomy surgery blood pressure dropped   Thyroid nodule 03/23/2013   Bilateral 81mm , noted on [place]   Vasovagal syncope    salty drinks help (gatorade)   Vitamin D deficiency     Past Surgical History:  Procedure Laterality Date   ABDOMINAL HYSTERECTOMY  2003   TAH/BSO   ANKLE SURGERY Bilateral 09/18/2017   excision lipomas    BLEPHAROPLASTY Bilateral    CARPAL TUNNEL RELEASE Right 2003   COLONOSCOPY     DILATION AND CURETTAGE OF UTERUS      ENDOMETRIAL ABLATION     KNEE ARTHROSCOPY  2010   RIGHT   LIPOMA EXCISION Bilateral    2019   PELVIC LAPAROSCOPY     LSO   TOTAL KNEE ARTHROPLASTY Right 02/12/2018   Procedure: RIGHT TOTAL KNEE ARTHROPLASTY;  Surgeon: Sydnee Cabal, MD;  Location: [facility];  Service: Orthopedics;  Laterality: Right;   TUBAL LIGATION  1976   UPPER GI ENDOSCOPY      Family History  Problem Relation Age of Onset   Diabetes Father    Heart disease Father    Hypertension Sister    Cancer Sister        LUNG  (SMOKER)   Diabetes Brother    Hypertension Brother    Breast cancer Neg Hx     Social History   Socioeconomic History   Marital status: Married    Spouse name: Not on file   Number of children: Not on file   Years of education: Not on file   Highest education level: Not on file  Occupational History   Not on file  Tobacco Use   Smoking status: Former    Types: Cigarettes    Quit date: 06/17/1981    Years since quitting: 29.1  Smokeless tobacco: Never  Vaping Use   Vaping Use: Never used  Substance and Sexual Activity   Alcohol use: No    Alcohol/week: 0.0 standard drinks of alcohol   Drug use: No   Sexual activity: Yes    Partners: Male    Birth control/protection: Surgical    Comment: 1st intercourse- 15, partners- 2, married- 55 yrs   Other Topics Concern   Not on file  Social History Narrative   Not on file   Social Determinants of Health   Financial Resource Strain: Low Risk  (12/23/2021)   Overall Financial Resource Strain (CARDIA)    Difficulty of Paying Living Expenses: Not hard at all  Food Insecurity: No Food Insecurity (12/23/2021)   Hunger Vital Sign    Worried About Running Out of Food in the Last Year: Never true    [place] in the Last Year: Never true  Transportation Needs: No Transportation Needs (12/23/2021)   PRAPARE - Hydrologist (Medical): No    Lack of Transportation (Non-Medical): No  Physical Activity:  Sufficiently Active (12/23/2021)   Exercise Vital Sign    Days of Exercise per Week: 7 days    Minutes of Exercise per Session: 30 min  Stress: No Stress Concern Present (12/23/2021)   [place]    Feeling of Stress : Not at all  Social Connections: [place] (12/23/2021)   Social Connection and Isolation Panel [NHANES]    Frequency of Communication with Friends and Family: More than three times a week    Frequency of Social Gatherings with Friends and Family: Twice a week    Attends Religious Services: More than 4 times per year    Active Member of [employer] or Organizations: Yes    Attends Archivist Meetings: 1 to 4 times per year    Marital Status: Married  Human resources officer Violence: Not At Risk (12/23/2021)   Humiliation, Afraid, Rape, and Kick questionnaire    Fear of Current or Ex-Partner: No    Emotionally Abused: No    Physically Abused: No    Sexually Abused: No    Outpatient Medications Prior to Visit  Medication Sig Dispense Refill   traMADol (ULTRAM) 50 MG tablet Take 50 mg by mouth every 4 (four) hours as needed.     ALPRAZolam (XANAX) 0.25 MG tablet Take 1 tablet (0.25 mg total)by mouth at bedtime as needed for anxiety. 10 tablet 0   Cholecalciferol (VITAMIN D3) 2000 units CHEW Chew 4,000 Units by mouth daily.     diclofenac (VOLTAREN) 75 MG EC tablet Take 1 tablet (75 mg total) by mouth 2 (two) times daily. 60 tablet 3   fluticasone (FLONASE) 50 MCG/ACT nasal spray Place 1 spray into both nostrils daily as needed for allergies.      levothyroxine (SYNTHROID) 88 MCG tablet Take 1 tablet (88 mcg total) by mouth daily before breakfast. 90 tablet 1   Melatonin 5 MG CHEW Chew 2 tablets by mouth at bedtime.     Multiple Vitamins-Minerals (MULTIVITAMIN PO) Take 3 tablets by mouth daily.     omeprazole (PRILOSEC) 40 MG capsule Take 1 capsule (40 mg total) by mouth daily. 90 capsule 1    promethazine-dextromethorphan (PROMETHAZINE-DM) 6.25-15 MG/5ML syrup Take 2.5 mLs by mouth 4 (four) times daily as needed for cough. 118 mL 0   tirzepatide (ZEPBOUND) 2.5 MG/0.5ML Pen Inject 2.5 mg into the skin once a week.  2 mL 0   amoxicillin (AMOXIL) 500 MG capsule Take 1 capsule (500 mg total) by mouth 3 (three) times daily for 5 days. 15 capsule 0   No facility-administered medications prior to visit.    Allergies  Allergen Reactions   Adhesive [Tape]     blisters   Aminoquinolines Other (See Comments)    Unknown   Azithromycin Other (See Comments)    Unknown   Bactrim [Sulfamethoxazole-Trimethoprim] Other (See Comments)    Unknown   Clarithromycin Other (See Comments)    UTI   Naproxen Sodium Other (See Comments)    Acid reflux   Other Swelling    Monocryl stitch 5-0 Plain Catgut suture   Vicodin [Hydrocodone-Acetaminophen] Nausea And Vomiting    Review of Systems  Constitutional:  Positive for fatigue. Negative for chills and fever.  HENT:  Positive for congestion, postnasal drip, sinus pressure, sinus pain and sore throat.   Eyes:  Negative for pain and discharge.  Respiratory:  Positive for cough. Negative for wheezing.   Cardiovascular:  Negative for chest pain and palpitations.  Gastrointestinal:  Negative for diarrhea, nausea and vomiting.  Genitourinary:  Negative for dysuria and hematuria.  Musculoskeletal:  Negative for neck pain and neck stiffness.  Skin:  Negative for rash.  Neurological:  Negative for dizziness and weakness.  Psychiatric/Behavioral:  Negative for agitation and behavioral problems.        Objective:    Physical Exam Vitals reviewed.  Constitutional:      General: She is not in acute distress.    Appearance: She is not diaphoretic.  HENT:     Head: Normocephalic and atraumatic.     Nose: Congestion present.     Right Sinus: Maxillary sinus tenderness present.     Left Sinus: Maxillary sinus tenderness present.     Mouth/Throat:      Mouth: Mucous membranes are moist.  Eyes:     General: No scleral icterus.    Extraocular Movements: Extraocular movements intact.  Cardiovascular:     Rate and Rhythm: Normal rate and regular rhythm.     Heart sounds: Normal heart sounds. No murmur heard. Pulmonary:     Breath sounds: Normal breath sounds. No wheezing or rales.  Musculoskeletal:     Cervical back: Neck supple. No tenderness.     Right lower leg: No edema.     Left lower leg: No edema.  Skin:    General: Skin is warm.     Findings: No rash.  Neurological:     General: No focal deficit present.     Mental Status: She is alert and oriented to person, place, and time.  Psychiatric:        Mood and Affect: Mood normal.        Behavior: Behavior normal.     BP (!) 145/76 (BP Location: Right Arm, Patient Position: Sitting, Cuff Size: Large)   Pulse 90   Ht 5\' 4"  (1.626 m)   Wt 182 lb 6.4 oz (82.7 kg)   SpO2 93%   BMI 31.31 kg/m  Wt Readings from Last 3 Encounters:  07/16/22 182 lb 6.4 oz (82.7 kg)  07/14/22 181 lb 9.6 oz (82.4 kg)  04/28/22 180 lb 3.2 oz (81.7 kg)        Assessment & Plan:   Problem List Items Addressed This Visit    Visit Diagnoses     Acute recurrent pansinusitis    -  Primary Likely has ethmoidal and maxillary sinusitis Has not  responded with amoxicillin Concern for resistance due to recurrent use of amoxicillin Switched to cefixime 400 mg QD Tessalon as needed for cough Advised to take Promethazine DM 5 mL as needed for cough Advised to use nasal saline spray or Flonase for nasal congestion Advised to use humidifier and/or vaporizer   Relevant Medications   benzonatate (TESSALON) 200 MG capsule   cefixime (SUPRAX) 400 MG CAPS capsule        Meds ordered this encounter  Medications   benzonatate (TESSALON) 200 MG capsule    Sig: Take 1 capsule (200 mg total) by mouth 2 (two) times daily as needed for cough.    Dispense:  20 capsule    Refill:  0   cefixime  (SUPRAX) 400 MG CAPS capsule    Sig: Take 1 capsule (400 mg total) by mouth daily.    Dispense:  7 capsule    Refill:  0     Keary Waterson Keith Rake, MD

## 2022-07-16 NOTE — Patient Instructions (Addendum)
Please start taking Cefixime once daily.  Please stop taking Amoxicillin.  Please take Promethazine DM syrup 5 ml 3-4 times in a day for cough.  Please take Tessalon as needed for dry cough.  Please use humidifier or vaporizer for nasal congestion.

## 2022-08-04 ENCOUNTER — Encounter: Payer: Self-pay | Admitting: Internal Medicine

## 2022-08-04 ENCOUNTER — Ambulatory Visit: Payer: Medicare Other | Admitting: Internal Medicine

## 2022-08-04 ENCOUNTER — Telehealth: Payer: Self-pay | Admitting: Internal Medicine

## 2022-08-04 ENCOUNTER — Ambulatory Visit (INDEPENDENT_AMBULATORY_CARE_PROVIDER_SITE_OTHER): Payer: Medicare Other | Admitting: Internal Medicine

## 2022-08-04 VITALS — BP 146/73 | HR 66 | Ht 64.0 in | Wt 183.8 lb

## 2022-08-04 DIAGNOSIS — R030 Elevated blood-pressure reading, without diagnosis of hypertension: Secondary | ICD-10-CM | POA: Diagnosis not present

## 2022-08-04 DIAGNOSIS — E782 Mixed hyperlipidemia: Secondary | ICD-10-CM | POA: Diagnosis not present

## 2022-08-04 DIAGNOSIS — R7303 Prediabetes: Secondary | ICD-10-CM

## 2022-08-04 DIAGNOSIS — E669 Obesity, unspecified: Secondary | ICD-10-CM

## 2022-08-04 LAB — POCT GLYCOSYLATED HEMOGLOBIN (HGB A1C): Hemoglobin A1C: 5.3 % (ref 4.0–5.6)

## 2022-08-04 MED ORDER — ZEPBOUND 2.5 MG/0.5ML ~~LOC~~ SOAJ: 2.5000 mg | SUBCUTANEOUS | 0 refills | Status: DC

## 2022-08-04 NOTE — Assessment & Plan Note (Signed)
Lipid panel updated in January.  Total cholesterol 178 and LDL 111.  Her 10-year ASCVD risk score today is 9.6%.  We again discussed that she is at intermediate risk and low intensity statin therapy is indicated.  Her preference remains to continue focusing on lifestyle modifications aimed at improving her cholesterol. -Repeat lipid panel at follow-up in 3 months

## 2022-08-04 NOTE — Assessment & Plan Note (Signed)
History of prediabetes.  Last A1c 5.9.  She has attempted to make significant dietary and lifestyle modifications aimed at losing weight and improving her glycemic control.  She has requested a repeat A1c today, which has improved to 5.3. -She was congratulated on her progress today and encouraged to continue lifestyle modifications aimed at weight loss.

## 2022-08-04 NOTE — Progress Notes (Signed)
Established Patient Office Visit  Subjective   Patient ID: Sandra Roy, female    DOB: 06-10-1955  Age: 67 y.o. MRN: [id number]  Chief Complaint  Patient presents with   Hypothyroidism    Follow up   Sandra Roy returns to care today for routine follow-up.  Last evaluated by me on 4/1 endorsing symptoms concerning for bacterial sinusitis.  Sandra Roy return to care on 4/3 due to her overall symptom worsening.  Antibiotic was switched to cefixime.  There have otherwise been no acute interval events. Sandra Roy reports feeling well today.  Sandra Roy is asymptomatic and has no acute concerns to discuss.  Sandra Roy will undergo surgery for lipoma removal in the proximal aspect of her right arm this Friday (4/26).  Sandra Roy endorses anxiety about the procedure, which Sandra Roy believes is contributing to her elevated blood pressure reading today.  Past Medical History:  Diagnosis Date   Adnexal mass 05/2000   Left   Anemia    history of   Anxiety    Autoimmune thyroiditis    Carpal tunnel syndrome    Right   Depression    Factor V Leiden mutation    Carrier   Foot fracture, left 05/06/2017   Hashimoto's disease    Hemorrhoids 04/25/2004   Mild external and internal noted on colonoscopy   History of colon polyps 04/25/2004   History of gastritis 04/25/2004   History of hiatal hernia 06/21/2002   History of menorrhagia    Hypoglycemic syndrome    Hypothyroidism    Lipoma    Bilateral ankles   NSVD (normal spontaneous vaginal delivery)    x3   OA (osteoarthritis)    Osteopenia    Osteopenia    Mild   PONV (postoperative nausea and vomiting)    Hysterectomy surgery blood pressure dropped   Thyroid nodule 03/23/2013   Bilateral 4mm , noted on [place]   Vasovagal syncope    salty drinks help (gatorade)   Vitamin D deficiency    Past Surgical History:  Procedure Laterality Date   ABDOMINAL HYSTERECTOMY  2003   TAH/BSO   ANKLE SURGERY Bilateral 09/18/2017   excision lipomas    BLEPHAROPLASTY  Bilateral    CARPAL TUNNEL RELEASE Right 2003   COLONOSCOPY     DILATION AND CURETTAGE OF UTERUS     ENDOMETRIAL ABLATION     KNEE ARTHROSCOPY  2010   RIGHT   LIPOMA EXCISION Bilateral    2019   PELVIC LAPAROSCOPY     LSO   TOTAL KNEE ARTHROPLASTY Right 02/12/2018   Procedure: RIGHT TOTAL KNEE ARTHROPLASTY;  Surgeon: Eugenia Mcalpine, MD;  Location: [facility];  Service: Orthopedics;  Laterality: Right;   TUBAL LIGATION  1976   UPPER GI ENDOSCOPY     Social History   Tobacco Use   Smoking status: Former    Types: Cigarettes    Quit date: 06/17/1981    Years since quitting: 41.1   Smokeless tobacco: Never  Vaping Use   Vaping Use: Never used  Substance Use Topics   Alcohol use: No    Alcohol/week: 0.0 standard drinks of alcohol   Drug use: No   Family History  Problem Relation Age of Onset   Diabetes Father    Heart disease Father    Hypertension Sister    Cancer Sister        LUNG  (SMOKER)   Diabetes Brother    Hypertension Brother    Breast cancer Neg Hx  Allergies  Allergen Reactions   Adhesive [Tape]     blisters   Aminoquinolines Other (See Comments)    Unknown   Azithromycin Other (See Comments)    Unknown   Bactrim [Sulfamethoxazole-Trimethoprim] Other (See Comments)    Unknown   Clarithromycin Other (See Comments)    UTI   Naproxen Sodium Other (See Comments)    Acid reflux   Other Swelling    Monocryl stitch 5-0 Plain Catgut suture   Vicodin [Hydrocodone-Acetaminophen] Nausea And Vomiting   Review of Systems  Psychiatric/Behavioral:  The patient is nervous/anxious.      Objective:     BP (!) 146/73   Pulse 66   Ht  (1.626 m)   Wt 183 lb 12.8 oz (83.4 kg)   SpO2 93%   BMI 31.55 kg/m  BP Readings from Last 3 Encounters:  08/04/22 (!) 146/73  07/16/22 (!) 145/76  07/14/22 132/72   Physical Exam Vitals reviewed.  Constitutional:      General: Sandra Roy is not in acute distress.    Appearance: Normal appearance. Sandra Roy is obese. Sandra Roy is not  toxic-appearing.  HENT:     Head: Normocephalic and atraumatic.     Right Ear: External ear normal.     Left Ear: External ear normal.     Nose: Nose normal. No congestion or rhinorrhea.     Mouth/Throat:     Mouth: Mucous membranes are moist.     Pharynx: Oropharynx is clear. No oropharyngeal exudate or posterior oropharyngeal erythema.  Eyes:     General: No scleral icterus.    Extraocular Movements: Extraocular movements intact.     Conjunctiva/sclera: Conjunctivae normal.     Pupils: Pupils are equal, round, and reactive to light.  Cardiovascular:     Rate and Rhythm: Normal rate and regular rhythm.     Pulses: Normal pulses.     Heart sounds: Normal heart sounds. No murmur heard.    No friction rub. No gallop.  Pulmonary:     Effort: Pulmonary effort is normal.     Breath sounds: Normal breath sounds. No wheezing, rhonchi or rales.  Abdominal:     General: Abdomen is flat. Bowel sounds are normal. There is no distension.     Palpations: Abdomen is soft.     Tenderness: There is no abdominal tenderness.  Musculoskeletal:        General: No swelling. Normal range of motion.     Cervical back: Normal range of motion.     Right lower leg: No edema.     Left lower leg: No edema.  Lymphadenopathy:     Cervical: No cervical adenopathy.  Skin:    General: Skin is warm and dry.     Capillary Refill: Capillary refill takes less than 2 seconds.     Coloration: Skin is not jaundiced.  Neurological:     General: No focal deficit present.     Mental Status: Sandra Roy is alert and oriented to person, place, and time.  Psychiatric:        Mood and Affect: Mood normal.        Behavior: Behavior normal.   Last CBC Lab Results  Component Value Date   WBC 6.0 02/10/2022   HGB 13.3 02/10/2022   HCT 40.1 02/10/2022   MCV 95 02/10/2022   MCH 31.4 02/10/2022   RDW 13.1 02/10/2022   PLT 247 02/10/2022   Last metabolic panel Lab Results  Component Value Date   GLUCOSE CANCELED  02/10/2022  NA 146 (H) 02/10/2022   K CANCELED 02/10/2022   CL 108 (H) 02/10/2022   CO2 20 02/10/2022   BUN 11 02/10/2022   CREATININE 0.66 02/10/2022   EGFR 96 02/10/2022   CALCIUM 9.0 02/10/2022   PROT 6.8 02/10/2022   ALBUMIN 4.5 02/10/2022   LABGLOB 2.3 02/10/2022   AGRATIO 2.0 02/10/2022   BILITOT <0.2 02/10/2022   ALKPHOS 105 02/10/2022   AST 20 02/10/2022   ALT 23 02/10/2022   ANIONGAP 7 02/13/2018   Last lipids Lab Results  Component Value Date   CHOL 178 04/28/2022   HDL 51 04/28/2022   LDLCALC 111 (H) 04/28/2022   TRIG 89 04/28/2022   CHOLHDL 3.5 04/28/2022   Last hemoglobin A1c Lab Results  Component Value Date   HGBA1C 5.3 08/04/2022   Last thyroid functions Lab Results  Component Value Date   TSH 1.400 12/03/2021   T4TOTAL 7.6 06/18/2011   Last vitamin D Lab Results  Component Value Date   VD25OH 46.3 12/03/2021   Last vitamin B12 and Folate Lab Results  Component Value Date   VITAMINB12 329 12/03/2021   FOLATE 10.3 12/03/2021   The 10-year ASCVD risk score (Arnett DK, et al., 2019) is: 9.6%    Assessment & Plan:   Problem List Items Addressed This Visit       Obesity (BMI 30.0-34.9)    BMI 31.5.  Previously on [place], but this was discontinued due to insurance. Zepbound was prescribed in January, but Sandra Roy states that Sandra Roy did not start the medication.  Sandra Roy remains interested in starting a medication for weight loss.  Sandra Roy has made dietary and lifestyle modifications aimed at weight loss.  Her goal weight is 160 pounds.  Sandra Roy currently weighs 183 pounds. -Zepbound refilled today -Follow up in 3 months for reassessment      Elevated blood pressure reading    BP elevated today, 166/90 initially, 146/80, and 146/73.  Sandra Roy endorses anxiety related to surgery later this week.  No prior history of hypertension. -No medication changes today.  Continue to monitor BP at subsequent appointments.      Prediabetes - Primary    History of  prediabetes.  Last A1c 5.9.  Sandra Roy has attempted to make significant dietary and lifestyle modifications aimed at losing weight and improving her glycemic control.  Sandra Roy has requested a repeat A1c today, which has improved to 5.3. -Sandra Roy was congratulated on her progress today and encouraged to continue lifestyle modifications aimed at weight loss.      Hyperlipidemia    Lipid panel updated in January.  Total cholesterol 178 and LDL 111.  Her 10-year ASCVD risk score today is 9.6%.  We again discussed that Sandra Roy is at intermediate risk and low intensity statin therapy is indicated.  Her preference remains to continue focusing on lifestyle modifications aimed at improving her cholesterol. -Repeat lipid panel at follow-up in 3 months       Return in about 3 months (around 11/03/2022).    Billie Lade, MD

## 2022-08-04 NOTE — Assessment & Plan Note (Signed)
BMI 31.5.  Previously on [place], but this was discontinued due to insurance. Zepbound was prescribed in January, but she states that she did not start the medication.  She remains interested in starting a medication for weight loss.  She has made dietary and lifestyle modifications aimed at weight loss.  Her goal weight is 160 pounds.  She currently weighs 183 pounds. -Zepbound refilled today -Follow up in 3 months for reassessment

## 2022-08-04 NOTE — Telephone Encounter (Signed)
Pt called in regard to tirzepatide (ZEPBOUND) 2.5 MG/0.5ML Pen   States that pharm has not received request, would not say why has not been received. Patient wants a call back in regard.

## 2022-08-04 NOTE — Patient Instructions (Signed)
It was a pleasure to see you today.  Thank you for giving [place] the opportunity to be involved in your care.  Below is a brief recap of your visit and next steps.  We will plan to see you again in 3 months.  Summary Zepbound re-ordered today No additional medication changges. Repeat A1c Follow up in 3 months

## 2022-08-04 NOTE — Telephone Encounter (Signed)
Spoke with patient.

## 2022-08-04 NOTE — Assessment & Plan Note (Signed)
BP elevated today, 166/90 initially, 146/80, and 146/73.  She endorses anxiety related to surgery later this week.  No prior history of hypertension. -No medication changes today.  Continue to monitor BP at subsequent appointments.

## 2022-08-08 ENCOUNTER — Other Ambulatory Visit: Payer: Self-pay | Admitting: Orthopedic Surgery

## 2022-08-08 DIAGNOSIS — D1721 Benign lipomatous neoplasm of skin and subcutaneous tissue of right arm: Secondary | ICD-10-CM | POA: Diagnosis not present

## 2022-08-08 DIAGNOSIS — R2231 Localized swelling, mass and lump, right upper limb: Secondary | ICD-10-CM | POA: Diagnosis not present

## 2022-08-08 DIAGNOSIS — M180 Bilateral primary osteoarthritis of first carpometacarpal joints: Secondary | ICD-10-CM | POA: Diagnosis not present

## 2022-09-03 ENCOUNTER — Other Ambulatory Visit: Payer: Self-pay | Admitting: Internal Medicine

## 2022-09-03 DIAGNOSIS — F419 Anxiety disorder, unspecified: Secondary | ICD-10-CM

## 2022-11-03 ENCOUNTER — Encounter: Payer: Self-pay | Admitting: Internal Medicine

## 2022-11-03 ENCOUNTER — Ambulatory Visit (INDEPENDENT_AMBULATORY_CARE_PROVIDER_SITE_OTHER): Payer: Medicare Other | Admitting: Internal Medicine

## 2022-11-03 VITALS — BP 121/75 | HR 80 | Ht 64.0 in | Wt 183.6 lb

## 2022-11-03 DIAGNOSIS — J3089 Other allergic rhinitis: Secondary | ICD-10-CM | POA: Diagnosis not present

## 2022-11-03 DIAGNOSIS — J309 Allergic rhinitis, unspecified: Secondary | ICD-10-CM | POA: Insufficient documentation

## 2022-11-03 DIAGNOSIS — E669 Obesity, unspecified: Secondary | ICD-10-CM

## 2022-11-03 DIAGNOSIS — E782 Mixed hyperlipidemia: Secondary | ICD-10-CM | POA: Diagnosis not present

## 2022-11-03 MED ORDER — FLUTICASONE PROPIONATE 50 MCG/ACT NA SUSP: 1.0000 | Freq: Every day | NASAL | 2 refills | Status: DC | PRN

## 2022-11-03 NOTE — Patient Instructions (Signed)
It was a pleasure to see you today.  Thank you for giving [place] the opportunity to be involved in your care.  Below is a brief recap of your visit and next steps.  We will plan to see you again in 6 months.  Summary No medication changes today Repeat lipid panel Follow up in 6 months

## 2022-11-03 NOTE — Progress Notes (Signed)
Established Patient Office Visit  Subjective   Patient ID: Sandra Roy, female    DOB: 10/30/1955  Age: 67 y.o. MRN: [id number]  Chief Complaint  Patient presents with   Hyperlipidemia    Follow up   Obesity    Follow up   Sandra Roy returns to care today for routine follow-up.  She was last evaluated by me on 4/22.  No medication changes were made at that time and 4-month follow-up was arranged.  There have been no acute interval events. Sandra Roy and has no acute reports feeling well today.  She is asymptomatic and has no acute concerns to discuss.  Past Medical History:  Diagnosis Date   Adnexal mass 05/2000   Left   Anemia    history of   Anxiety    Autoimmune thyroiditis    Carpal tunnel syndrome    Right   Depression    Factor V Leiden mutation (HCC)    [place]   Foot fracture, left 05/06/2017   Hashimoto's disease    Hemorrhoids 04/25/2004   Mild external and internal noted on colonoscopy   History of colon polyps 04/25/2004   History of gastritis 04/25/2004   History of hiatal hernia 06/21/2002   History of menorrhagia    Hypoglycemic syndrome    Hypothyroidism    Lipoma    Bilateral ankles   NSVD (normal spontaneous vaginal delivery)    x3   OA (osteoarthritis)    Osteopenia    Osteopenia    Mild   PONV (postoperative nausea and vomiting)    Hysterectomy surgery blood pressure dropped   Thyroid nodule 03/23/2013   Bilateral 4mm , noted on [place]   Vasovagal syncope    salty drinks help (gatorade)   Vitamin D deficiency    Past Surgical History:  Procedure Laterality Date   ABDOMINAL HYSTERECTOMY  2003   TAH/BSO   ANKLE SURGERY Bilateral 09/18/2017   excision lipomas    BLEPHAROPLASTY Bilateral    CARPAL TUNNEL RELEASE Right 2003   COLONOSCOPY     DILATION AND CURETTAGE OF UTERUS     ENDOMETRIAL ABLATION     KNEE ARTHROSCOPY  2010   RIGHT   LIPOMA EXCISION Bilateral    2019   PELVIC LAPAROSCOPY     LSO   TOTAL KNEE ARTHROPLASTY  Right 02/12/2018   Procedure: RIGHT TOTAL KNEE ARTHROPLASTY;  Surgeon: Eugenia Mcalpine, MD;  Location: [facility];  Service: Orthopedics;  Laterality: Right;   TUBAL LIGATION  1976   UPPER GI ENDOSCOPY     Social History   Tobacco Use   Smoking status: Former    Current packs/day: 0.00    Types: Cigarettes    Quit date: 06/17/1981    Years since quitting: 41.4   Smokeless tobacco: Never  Vaping Use   Vaping status: Never Used  Substance Use Topics   Alcohol use: No    Alcohol/week: 0.0 standard drinks of alcohol   Drug use: No   Family History  Problem Relation Age of Onset   Diabetes Father    Heart disease Father    Hypertension Sister    Cancer Sister        LUNG  (SMOKER)   Diabetes Brother    Hypertension Brother    Breast cancer Neg Hx    Allergies  Allergen Reactions   Adhesive [Tape]     blisters   Aminoquinolines Other (See Comments)    Unknown   Azithromycin Other (See  Comments)    Unknown   Bactrim [Sulfamethoxazole-Trimethoprim] Other (See Comments)    Unknown   Clarithromycin Other (See Comments)    UTI   Naproxen Sodium Other (See Comments)    Acid reflux   Other Swelling    Monocryl stitch 5-0 Plain Catgut suture   Vicodin [Hydrocodone-Acetaminophen] Nausea And Vomiting   Review of Systems  Constitutional:  Negative for chills and fever.  HENT:  Negative for sore throat.   Respiratory:  Negative for cough and shortness of breath.   Cardiovascular:  Negative for chest pain, palpitations and leg swelling.  Gastrointestinal:  Negative for abdominal pain, blood in stool, constipation, diarrhea, nausea and vomiting.  Genitourinary:  Negative for dysuria and hematuria.  Musculoskeletal:  Negative for myalgias.  Skin:  Negative for itching and rash.  Neurological:  Negative for dizziness and headaches.  Psychiatric/Behavioral:  Negative for depression and suicidal ideas.      Objective:     BP 121/75   Pulse 80   Ht 5\' 4"  (1.626 m)   Wt 183 lb  9.6 oz (83.3 kg)   SpO2 95%   BMI 31.51 kg/m  BP Readings from Last 3 Encounters:  11/03/22 121/75  08/04/22 (!) 146/73  07/16/22 (!) 145/76   Physical Exam Vitals reviewed.  Constitutional:      General: She is not in acute distress.    Appearance: Normal appearance. She is obese. She is not toxic-appearing.  HENT:     Head: Normocephalic and atraumatic.     Right Ear: External ear normal.     Left Ear: External ear normal.     Nose: Nose normal. No congestion or rhinorrhea.     Mouth/Throat:     Mouth: Mucous membranes are moist.     Pharynx: Oropharynx is clear. No oropharyngeal exudate or posterior oropharyngeal erythema.  Eyes:     General: No scleral icterus.    Extraocular Movements: Extraocular movements intact.     Conjunctiva/sclera: Conjunctivae normal.     Pupils: Pupils are equal, round, and reactive to light.  Cardiovascular:     Rate and Rhythm: Normal rate and regular rhythm.     Pulses: Normal pulses.     Heart sounds: Normal heart sounds. No murmur heard.    No friction rub. No gallop.  Pulmonary:     Effort: Pulmonary effort is normal.     Breath sounds: Normal breath sounds. No wheezing, rhonchi or rales.  Abdominal:     General: Abdomen is flat. Bowel sounds are normal. There is no distension.     Palpations: Abdomen is soft.     Tenderness: There is no abdominal tenderness.  Musculoskeletal:        General: No swelling. Normal range of motion.     Cervical back: Normal range of motion.     Right lower leg: No edema.     Left lower leg: No edema.  Lymphadenopathy:     Cervical: No cervical adenopathy.  Skin:    General: Skin is warm and dry.     Capillary Refill: Capillary refill takes less than 2 seconds.     Coloration: Skin is not jaundiced.  Neurological:     General: No focal deficit present.     Mental Status: She is alert and oriented to person, place, and time.  Psychiatric:        Mood and Affect: Mood normal.        Behavior:  Behavior normal.   Last CBC Lab  Results  Component Value Date   WBC 6.0 02/10/2022   HGB 13.3 02/10/2022   HCT 40.1 02/10/2022   MCV 95 02/10/2022   MCH 31.4 02/10/2022   RDW 13.1 02/10/2022   PLT 247 02/10/2022   Last metabolic panel Lab Results  Component Value Date   GLUCOSE CANCELED 02/10/2022   NA 146 (H) 02/10/2022   K CANCELED 02/10/2022   CL 108 (H) 02/10/2022   CO2 20 02/10/2022   BUN 11 02/10/2022   CREATININE 0.66 02/10/2022   EGFR 96 02/10/2022   CALCIUM 9.0 02/10/2022   PROT 6.8 02/10/2022   ALBUMIN 4.5 02/10/2022   LABGLOB 2.3 02/10/2022   AGRATIO 2.0 02/10/2022   BILITOT <0.2 02/10/2022   ALKPHOS 105 02/10/2022   AST 20 02/10/2022   ALT 23 02/10/2022   ANIONGAP 7 02/13/2018   Last lipids Lab Results  Component Value Date   CHOL 178 04/28/2022   HDL 51 04/28/2022   LDLCALC 111 (H) 04/28/2022   TRIG 89 04/28/2022   CHOLHDL 3.5 04/28/2022   Last hemoglobin A1c Lab Results  Component Value Date   HGBA1C 5.3 08/04/2022   Last thyroid functions Lab Results  Component Value Date   TSH 1.400 12/03/2021   T4TOTAL 7.6 06/18/2011   Last vitamin D Lab Results  Component Value Date   VD25OH 46.3 12/03/2021   Last vitamin B12 and Folate Lab Results  Component Value Date   VITAMINB12 329 12/03/2021   FOLATE 10.3 12/03/2021   The 10-year ASCVD risk score (Arnett DK, et al., 2019) is: 6.7%    Assessment & Plan:   Problem List Items Addressed This Visit       Allergic rhinitis    Fluticasone has been refilled today      Obesity (BMI 30.0-34.9)    Weight remains stable.  Zepbound is not covered by insurance.  She continues to focus on lifestyle modifications aimed at weight loss.  She has started exercising regularly and is following the Mediterranean diet.      Hyperlipidemia - Primary    Lipid panel updated in January.  Total cholesterol 178 and LDL 111.  Intermediate ASCVD risk noted at that time.  We discussed the indication for low  intensity statin therapy, however she preferred to focus on lifestyle modifications aimed at improving her cholesterol.  She has been following a Mediterranean diet. -Repeat lipid panel ordered today       Return in about 6 months (around 05/06/2023).    Billie Lade, MD

## 2022-11-03 NOTE — Assessment & Plan Note (Signed)
Fluticasone has been refilled today

## 2022-11-03 NOTE — Assessment & Plan Note (Signed)
Lipid panel updated in January.  Total cholesterol 178 and LDL 111.  Intermediate ASCVD risk noted at that time.  We discussed the indication for low intensity statin therapy, however she preferred to focus on lifestyle modifications aimed at improving her cholesterol.  She has been following a Mediterranean diet. -Repeat lipid panel ordered today

## 2022-11-03 NOTE — Assessment & Plan Note (Signed)
Weight remains stable.  Zepbound is not covered by insurance.  She continues to focus on lifestyle modifications aimed at weight loss.  She has started exercising regularly and is following the Mediterranean diet.

## 2022-11-04 LAB — LIPID PANEL
Chol/HDL Ratio: 3.2 ratio (ref 0.0–4.4)
Cholesterol, Total: 175 mg/dL (ref 100–199)
HDL: 54 mg/dL (ref >39)
LDL Chol Calc (NIH): 102 mg/dL — ABNORMAL HIGH (ref 0–99)
Triglycerides: 103 mg/dL (ref 0–149)
VLDL Cholesterol Cal: 19 mg/dL (ref 5–40)

## 2022-11-17 ENCOUNTER — Other Ambulatory Visit: Payer: Self-pay | Admitting: Internal Medicine

## 2022-11-17 DIAGNOSIS — E038 Other specified hypothyroidism: Secondary | ICD-10-CM

## 2022-11-17 DIAGNOSIS — F419 Anxiety disorder, unspecified: Secondary | ICD-10-CM

## 2022-11-17 DIAGNOSIS — K219 Gastro-esophageal reflux disease without esophagitis: Secondary | ICD-10-CM

## 2022-11-17 DIAGNOSIS — M1711 Unilateral primary osteoarthritis, right knee: Secondary | ICD-10-CM

## 2022-11-17 MED ORDER — LEVOTHYROXINE SODIUM 88 MCG PO TABS: 88.0000 ug | ORAL_TABLET | Freq: Every day | ORAL | 1 refills | Status: DC

## 2022-11-17 MED ORDER — OMEPRAZOLE 40 MG PO CPDR: 40.0000 mg | DELAYED_RELEASE_CAPSULE | Freq: Every day | ORAL | 1 refills | Status: DC

## 2022-11-27 DIAGNOSIS — R519 Headache, unspecified: Secondary | ICD-10-CM | POA: Diagnosis not present

## 2022-11-27 DIAGNOSIS — H524 Presbyopia: Secondary | ICD-10-CM | POA: Diagnosis not present

## 2022-12-24 ENCOUNTER — Ambulatory Visit (INDEPENDENT_AMBULATORY_CARE_PROVIDER_SITE_OTHER): Payer: Medicare Other

## 2022-12-24 VITALS — Ht 64.0 in | Wt 178.0 lb

## 2022-12-24 DIAGNOSIS — Z780 Asymptomatic menopausal state: Secondary | ICD-10-CM

## 2022-12-24 DIAGNOSIS — Z0000 Encounter for general adult medical examination without abnormal findings: Secondary | ICD-10-CM

## 2022-12-24 DIAGNOSIS — Z1231 Encounter for screening mammogram for malignant neoplasm of breast: Secondary | ICD-10-CM

## 2022-12-24 NOTE — Patient Instructions (Addendum)
Sandra Roy , Thank you for taking time to come for your Medicare Wellness Visit. I appreciate your ongoing commitment to your health goals. Please review the following plan we discussed and let me know if I can assist you in the future.   Referrals/Orders/Follow-Ups/Clinician Recommendations:  You have an order for:  []   2D Mammogram  [x]   3D Mammogram  [x]   Bone Density   []   Lung Cancer Screening  Please call for appointment:   [facility] [address] [place], [place] [address] [phone]  Make sure to wear two-piece clothing.  No lotions powders or deodorants the day of the appointment Make sure to bring picture ID and insurance card.  Bring list of medications you are currently taking including any supplements.   Schedule your [place] screening mammogram through MyChart!   Log into your MyChart account.  Go to 'Visit' (or 'Appointments' if on mobile App) --> Schedule an Appointment  Under 'Select a Reason for Visit' choose the Mammogram Screening option.  Complete the pre-visit questions and select the time and place that best fits your schedule.  You are due for the vaccines checked below. You may have these done at your preferred pharmacy. Please have them fax the office proof of the vaccines so that we can update your chart.   [x]  Flu (due annually)  Recommended this fall either at PCP office or through your local pharmacy. The flu season starts August 1 of each year.   [x]  Shingrix (Shingles vaccine): CDC recommends 2 doses of Shingrix separated by 2-6 months for aged 85 years and older:  [x]  Pneumonia Vaccines: Recommended for adults 65 years or older  []  TDAP (Tetanus) Vaccine every 10 years:Recommended every 10 years; Please call your insurance company to determine your out of pocket expense. You also receive this vaccine at your local pharmacy or Health Dept.  [x]  Covid-19: Available now at any [facility] pharmacy  (see info below)  You may also get your vaccines at any [facility] (locations listed below.)   Vaccine hours are Monday - Friday 9:00 - 4:00. No appointments are required. Most insurances are accepted including Medicaid. Anyone can use the community pharmacies, and people are not required to have a [facility] provider.  Community Pharmacy Locations offering vaccines:   Sport and exercise psychologist   [facility]  10 vaccines are offered at the [employer]: Covid, flu, Tdap, shingles, RSV, pneumonia, meningococcal, hepatitis A, hepatitis B, and HPV.    This is a list of the screening recommended for you and due dates:  Health Maintenance  Topic Date Due   DTaP/Tdap/Td vaccine (1 - Tdap) Never done   Zoster (Shingles) Vaccine (1 of 2) Never done   DEXA scan (bone density measurement)  08/29/2018   Pneumonia Vaccine (2 of 2 - PCV) 02/17/2019   COVID-19 Vaccine (3 - 2023-24 season) 12/14/2022   Flu Shot  11/13/2023*   Mammogram  01/16/2023   Medicare Annual Wellness Visit  12/24/2023   Colon Cancer Screening  10/23/2031   Hepatitis C Screening  Completed   HPV Vaccine  Aged Out  *Topic was postponed. The date shown is not the original due date.    Advanced directives: (In Chart) A copy of your advanced directives are scanned into your chart should your provider ever need it.  Next Medicare Annual Wellness  Visit scheduled for next year: Yes Next AWV appointment: March 02, 2024 at 1:40pm video visit  Preventive Care 65 Years and Older, Female Preventive care refers to lifestyle choices and visits with your health care provider that can promote health and wellness. Preventive care visits are also called wellness exams. What can I expect for my preventive care visit? Counseling Your health care provider may ask you questions about your: Medical history, including: Past  medical problems. Family medical history. Pregnancy and menstrual history. History of falls. Current health, including: Memory and ability to understand (cognition). Emotional well-being. Home life and relationship well-being. Sexual activity and sexual health. Lifestyle, including: Alcohol, nicotine or tobacco, and drug use. Access to firearms. Diet, exercise, and sleep habits. Work and work Astronomer. Sunscreen use. Safety issues such as seatbelt and bike helmet use. Physical exam Your health care provider will check your: Height and weight. These may be used to calculate your BMI (body mass index). BMI is a measurement that tells if you are at a healthy weight. Waist circumference. This measures the distance around your waistline. This measurement also tells if you are at a healthy weight and may help predict your risk of certain diseases, such as type 2 diabetes and high blood pressure. Heart rate and blood pressure. Body temperature. Skin for abnormal spots. What immunizations do I need?  Vaccines are usually given at various ages, according to a schedule. Your health care provider will recommend vaccines for you based on your age, medical history, and lifestyle or other factors, such as travel or where you work. What tests do I need? Screening Your health care provider may recommend screening tests for certain conditions. This may include: Lipid and cholesterol levels. Hepatitis C test. Hepatitis B test. HIV (human immunodeficiency virus) test. STI (sexually transmitted infection) testing, if you are at risk. Lung cancer screening. Colorectal cancer screening. Diabetes screening. This is done by checking your blood sugar (glucose) after you have not eaten for a while (fasting). Mammogram. Talk with your health care provider about how often you should have regular mammograms. BRCA-related cancer screening. This may be done if you have a family history of breast, ovarian,  tubal, or peritoneal cancers. Bone density scan. This is done to screen for osteoporosis. Talk with your health care provider about your test results, treatment options, and if necessary, the need for more tests. Follow these instructions at home: Eating and drinking  Eat a diet that includes fresh fruits and vegetables, whole grains, lean protein, and low-fat dairy products. Limit your intake of foods with high amounts of sugar, saturated fats, and salt. Take vitamin and mineral supplements as recommended by your health care provider. Do not drink alcohol if your health care provider tells you not to drink. If you drink alcohol: Limit how much you have to 0-1 drink a day. Know how much alcohol is in your drink. In the [place] one drink equals one 12 oz bottle of beer (355 mL), one 5 oz glass of wine (148 mL), or one 1 oz glass of hard liquor (44 mL). Lifestyle Brush your teeth every morning and night with fluoride toothpaste. Floss one time each day. Exercise for at least 30 minutes 5 or more days each week. Do not use any products that contain nicotine or tobacco. These products include cigarettes, chewing tobacco, and vaping devices, such as e-cigarettes. If you need help quitting, ask your health care provider. Do not use drugs. If you are sexually active, practice safe sex. Use  a condom or other form of protection in order to prevent STIs. Take aspirin only as told by your health care provider. Make sure that you understand how much to take and what form to take. Work with your health care provider to find out whether it is safe and beneficial for you to take aspirin daily. Ask your health care provider if you need to take a cholesterol-lowering medicine (statin). Find healthy ways to manage stress, such as: Meditation, yoga, or listening to music. Journaling. Talking to a trusted person. Spending time with friends and family. Minimize exposure to UV radiation to reduce your risk of skin  cancer. Safety Always wear your seat belt while driving or riding in a vehicle. Do not drive: If you have been drinking alcohol. Do not ride with someone who has been drinking. When you are tired or distracted. While texting. If you have been using any mind-altering substances or drugs. Wear a helmet and other protective equipment during sports activities. If you have firearms in your house, make sure you follow all gun safety procedures. What's next? Visit your health care provider once a year for an annual wellness visit. Ask your health care provider how often you should have your eyes and teeth checked. Stay up to date on all vaccines. This information is not intended to replace advice given to you by your health care provider. Make sure you discuss any questions you have with your health care provider. Document Revised: 09/26/2020 Document Reviewed: 09/26/2020 Elsevier Patient Education  2024 [employer]. Understanding Your Risk for Falls Millions of people have serious injuries from falls each year. It is important to understand your risk of falling. Talk with your health care provider about your risk and what you can do to lower it. If you do have a serious fall, make sure to tell your provider. Falling once raises your risk of falling again. How can falls affect me? Serious injuries from falls are common. These include: Broken bones, such as hip fractures. Head injuries, such as traumatic brain injuries (TBI) or concussions. A fear of falling can cause you to avoid activities and stay at home. This can make your muscles weaker and raise your risk for a fall. What can increase my risk? There are a number of risk factors that increase your risk for falling. The more risk factors you have, the higher your risk of falling. Serious injuries from a fall happen most often to people who are older than 69 years old. Teenagers and young adults ages 17-29 are also at higher risk. Common  risk factors include: Weakness in the lower body. Being generally weak or confused due to long-term (chronic) illness. Dizziness or balance problems. Poor vision. Medicines that cause dizziness or drowsiness. These may include: Medicines for your blood pressure, heart, anxiety, insomnia, or swelling (edema). Pain medicines. Muscle relaxants. Other risk factors include: Drinking alcohol. Having had a fall in the past. Having foot pain or wearing improper footwear. Working at a dangerous job. Having any of the following in your home: Tripping hazards, such as floor clutter or loose rugs. Poor lighting. Pets. Having dementia or memory loss. What actions can I take to lower my risk of falling?     Physical activity Stay physically fit. Do strength and balance exercises. Consider taking a regular class to build strength and balance. Yoga and tai chi are good options. Vision Have your eyes checked every year and your prescription for glasses or contacts updated as needed. Shoes and  walking aids Wear non-skid shoes. Wear shoes that have rubber soles and low heels. Do not wear high heels. Do not walk around the house in socks or slippers. Use a cane or walker as told by your provider. Home safety Attach secure railings on both sides of your stairs. Install grab bars for your bathtub, shower, and toilet. Use a non-skid mat in your bathtub or shower. Attach bath mats securely with double-sided, non-slip rug tape. Use good lighting in all rooms. Keep a flashlight near your bed. Make sure there is a clear path from your bed to the bathroom. Use night-lights. Do not use throw rugs. Make sure all carpeting is taped or tacked down securely. Remove all clutter from walkways and stairways, including extension cords. Repair uneven or broken steps and floors. Avoid walking on icy or slippery surfaces. Walk on the grass instead of on icy or slick sidewalks. Use ice melter to get rid of ice on  walkways in the winter. Use a cordless phone. Questions to ask your health care provider Can you help me check my risk for a fall? Do any of my medicines make me more likely to fall? Should I take a vitamin D supplement? What exercises can I do to improve my strength and balance? Should I make an appointment to have my vision checked? Do I need a bone density test to check for weak bones (osteoporosis)? Would it help to use a cane or a walker? Where to find more information Centers for Disease Control and Prevention, [employer]: TonerPromos.no Community-Based Fall Prevention Programs: TonerPromos.no [employer] on Aging: BaseRingTones.pl Contact a health care provider if: You fall at home. You are afraid of falling at home. You feel weak, drowsy, or dizzy. This information is not intended to replace advice given to you by your health care provider. Make sure you discuss any questions you have with your health care provider. Document Revised: 12/02/2021 Document Reviewed: 12/02/2021 Elsevier Patient Education  2024 Elsevier Inc. Bone Density Test A bone density test uses a type of X-ray to measure the amount of calcium and other minerals in a person's bones. It can measure bone density in the hip and the spine. The test is similar to having a regular X-ray. This test may also be called: Bone densitometry. Bone mineral density test. Dual-energy X-ray absorptiometry (DEXA). You may have this test to: Diagnose a condition that causes weak or thin bones (osteoporosis). Screen you for osteoporosis. Predict your risk for a broken bone (fracture). Determine how well your osteoporosis treatment is working. Tell a health care provider about: Any allergies you have. All medicines you are taking, including vitamins, herbs, eye drops, creams, and over-the-counter medicines. Any problems you or family members have had with anesthetic medicines. Any blood disorders you have. Any surgeries you have had. Any  medical conditions you have. Whether you are pregnant or may be pregnant. Any medical tests you have had within the past 14 days that used contrast material. What are the risks? Generally, this is a safe test. However, it does expose you to a small amount of radiation, which can slightly increase your cancer risk. What happens before the test? Do not take any calcium supplements within the 24 hours before your test. You will need to remove all metal jewelry, eyeglasses, removable dental appliances, and any other metal objects on your body. What happens during the test?  You will lie down on an exam table. There will be an X-ray generator below you and an  imaging device above you. Other devices, such as boxes or braces, may be used to position your body properly for the scan. The machine will slowly scan your body. You will need to keep very still while the machine does the scan. The images will show up on a screen in the room. Images will be examined by a specialist after your test is finished. The procedure may vary among health care providers and hospitals. What can I expect after the test? It is up to you to get the results of your test. Ask your health care provider, or the department that is doing the test, when your results will be ready. Summary A bone density test is an imaging test that uses a type of X-ray to measure the amount of calcium and other minerals in your bones. The test may be used to diagnose or screen you for a condition that causes weak or thin bones (osteoporosis), predict your risk for a broken bone (fracture), or determine how well your osteoporosis treatment is working. Do not take any calcium supplements within 24 hours before your test. Ask your health care provider, or the department that is doing the test, when your results will be ready. This information is not intended to replace advice given to you by your health care provider. Make sure you discuss any  questions you have with your health care provider. Document Revised: 12/12/2020 Document Reviewed: 09/15/2019 Elsevier Patient Education  2024 [employer].

## 2022-12-24 NOTE — Progress Notes (Signed)
Because this visit was a virtual/telehealth visit,  certain criteria was not obtained, such a blood pressure, CBG if applicable, and timed get up and go. Any medications not marked as "taking" were not mentioned during the medication reconciliation part of the visit. Any vitals not documented were not able to be obtained due to this being a telehealth visit or patient was unable to self-report a recent blood pressure reading due to a lack of equipment at home via telehealth. Vitals that have been documented are verbally provided by the patient.   Subjective:   Sandra Roy is a 67 y.o. female who presents for Medicare Annual (Subsequent) preventive examination.  Visit Complete: Virtual  I connected with  Sandra Roy on 12/24/22 by a audio enabled telemedicine application and verified that I am speaking with the correct person using two identifiers.  Patient Location: Home  Provider Location: Home Office  I discussed the limitations of evaluation and management by telemedicine. The patient expressed understanding and agreed to proceed.  Patient Medicare AWV questionnaire was completed by the patient on na; I have confirmed that all information answered by patient is correct and no changes since this date.  Review of Systems     Cardiac Risk Factors include: advanced age (>59men, >11 women)     Objective:    Today's Vitals   12/24/22 0842  Weight: 178 lb (80.7 kg)  Height: 5\' 4"  (1.626 m)   Body mass index is 30.55 kg/m.     12/24/2022    8:42 AM 12/23/2021    8:37 AM 02/20/2018    8:48 AM 02/05/2018   11:13 AM  Advanced Directives  Does Patient Have a Medical Advance Directive? Yes Yes Yes Yes  Type of Estate agent of [place];Living will Healthcare Power of [employer] Power of [employer] Power of [place];Living will  Does patient want to make changes to medical advance directive? No - Patient declined No - Patient  declined  No - Patient declined  Copy of Healthcare Power of Attorney in Chart? Yes - validated most recent copy scanned in chart (See row information) No - copy requested  No - copy requested  Would patient like information on creating a medical advance directive? No - Patient declined       Current Medications (verified) Outpatient Encounter Medications as of 12/24/2022  Medication Sig   ALPRAZolam (XANAX) 0.25 MG tablet Take 1 tablet (0.25 mg total) by mouth at bedtime as needed for anxiety.   benzonatate (TESSALON) 200 MG capsule Take 1 capsule (200 mg total) by mouth 2 (two) times daily as needed for cough.   Cholecalciferol (VITAMIN D3) 2000 units CHEW Chew 4,000 Units by mouth daily.   diclofenac (VOLTAREN) 75 MG EC tablet TAKE (1) TABLET BY MOUTH TWICE DAILY.   fluticasone (FLONASE) 50 MCG/ACT nasal spray Place 1 spray into both nostrils daily as needed for allergies.   levothyroxine (SYNTHROID) 88 MCG tablet Take 1 tablet (88 mcg total) by mouth daily before breakfast.   Melatonin 5 MG CHEW Chew 2 tablets by mouth at bedtime.   Multiple Vitamins-Minerals (MULTIVITAMIN PO) Take 3 tablets by mouth daily.   omeprazole (PRILOSEC) 40 MG capsule Take 1 capsule (40 mg total) by mouth daily.   promethazine-dextromethorphan (PROMETHAZINE-DM) 6.25-15 MG/5ML syrup Take 2.5 mLs by mouth 4 (four) times daily as needed for cough.   traMADol (ULTRAM) 50 MG tablet Take 50 mg by mouth every 4 (four) hours as needed.   No facility-administered  encounter medications on file as of 12/24/2022.    Allergies (verified) Adhesive [tape], Aminoquinolines, Azithromycin, Bactrim [sulfamethoxazole-trimethoprim], Clarithromycin, Naproxen sodium, Other, and Vicodin [hydrocodone-acetaminophen]   History: Past Medical History:  Diagnosis Date   Adnexal mass 05/2000   Left   Anemia    history of   Anxiety    Autoimmune thyroiditis    Carpal tunnel syndrome    Right   Depression    Factor V Leiden mutation  (HCC)    Carrier   Foot fracture, left 05/06/2017   Hashimoto's disease    Hemorrhoids 04/25/2004   Mild external and internal noted on colonoscopy   History of colon polyps 04/25/2004   History of gastritis 04/25/2004   History of hiatal hernia 06/21/2002   History of menorrhagia    Hypoglycemic syndrome    Hypothyroidism    Lipoma    Bilateral ankles   NSVD (normal spontaneous vaginal delivery)    x3   OA (osteoarthritis)    Osteopenia    Osteopenia    Mild   PONV (postoperative nausea and vomiting)    Hysterectomy surgery blood pressure dropped   Thyroid nodule 03/23/2013   Bilateral 4mm , noted on [place]   Vasovagal syncope    salty drinks help (gatorade)   Vitamin D deficiency    Past Surgical History:  Procedure Laterality Date   ABDOMINAL HYSTERECTOMY  2003   TAH/BSO   ANKLE SURGERY Bilateral 09/18/2017   excision lipomas    BLEPHAROPLASTY Bilateral    CARPAL TUNNEL RELEASE Right 2003   COLONOSCOPY     DILATION AND CURETTAGE OF UTERUS     ENDOMETRIAL ABLATION     KNEE ARTHROSCOPY  2010   RIGHT   LIPOMA EXCISION Bilateral    2019   PELVIC LAPAROSCOPY     LSO   TOTAL KNEE ARTHROPLASTY Right 02/12/2018   Procedure: RIGHT TOTAL KNEE ARTHROPLASTY;  Surgeon: Eugenia Mcalpine, MD;  Location: [facility];  Service: Orthopedics;  Laterality: Right;   TUBAL LIGATION  1976   UPPER GI ENDOSCOPY     Family History  Problem Relation Age of Onset   Diabetes Father    Heart disease Father    Hypertension Sister    Cancer Sister        LUNG  (SMOKER)   Diabetes Brother    Hypertension Brother    Breast cancer Neg Hx    Social History   Socioeconomic History   Marital status: Married    Spouse name: Not on file   Number of children: Not on file   Years of education: Not on file   Highest education level: Not on file  Occupational History   Not on file  Tobacco Use   Smoking status: Former    Current packs/day: 0.00    Types: Cigarettes    Quit date: 06/17/1981     Years since quitting: 41.5   Smokeless tobacco: Never  Vaping Use   Vaping status: Never Used  Substance and Sexual Activity   Alcohol use: No    Alcohol/week: 0.0 standard drinks of alcohol   Drug use: No   Sexual activity: Yes    Partners: Male    Birth control/protection: Surgical    Comment: 1st intercourse- 15, partners- 2, married- 44 yrs   Other Topics Concern   Not on file  Social History Narrative   Not on file   Social Determinants of Health   Financial Resource Strain: Low Risk  (12/24/2022)   Overall Financial  Resource Strain (CARDIA)    Difficulty of Paying Living Expenses: Not hard at all  Food Insecurity: No Food Insecurity (12/24/2022)   Hunger Vital Sign    Worried About Running Out of Food in the Last Year: Never true    Ran Out of Food in the Last Year: Never true  Transportation Needs: No Transportation Needs (12/24/2022)   PRAPARE - Administrator, Civil Service (Medical): No    Lack of Transportation (Non-Medical): No  Physical Activity: Sufficiently Active (12/24/2022)   Exercise Vital Sign    Days of Exercise per Week: 7 days    Minutes of Exercise per Session: 30 min  Stress: No Stress Concern Present (12/24/2022)   [employer] of Occupational Health - Occupational Stress Questionnaire    Feeling of Stress : Only a little  Social Connections: Socially Integrated (12/24/2022)   Social Connection and Isolation Panel [NHANES]    Frequency of Communication with Friends and Family: More than three times a week    Frequency of Social Gatherings with Friends and Family: More than three times a week    Attends Religious Services: More than 4 times per year    Active Member of [employer] or Organizations: Yes    Attends Engineer, structural: More than 4 times per year    Marital Status: Married    Tobacco Counseling Counseling given: Yes   Clinical Intake:  Pre-visit preparation completed: Yes  Pain : No/denies pain     BMI -  recorded: 30.55 Nutritional Status: BMI > 30  Obese Nutritional Risks: None Diabetes: No  How often do you need to have someone help you when you read instructions, pamphlets, or other written materials from your doctor or pharmacy?: 1 - Never  Interpreter Needed?: No  Information entered by :: Abby Rhyatt Muska, CMA   Activities of Daily Living    12/24/2022    9:00 AM  In your present state of health, do you have any difficulty performing the following activities:  Hearing? 0  Vision? 0  Difficulty concentrating or making decisions? 0  Walking or climbing stairs? 0  Dressing or bathing? 0  Doing errands, shopping? 0  Preparing Food and eating ? N  Using the Toilet? N  In the past six months, have you accidently leaked urine? N  Do you have problems with loss of bowel control? N  Managing your Medications? N  Managing your Finances? N  Housekeeping or managing your Housekeeping? N    Patient Care Team: Billie Lade, MD as PCP - General (Internal Medicine)  Indicate any recent Medical Services you may have received from other than [facility] providers in the past year (date may be approximate).     Assessment:   This is a routine wellness examination for Sandra Roy.  Hearing/Vision screen Hearing Screening - Comments:: Patient denies any hearing difficulties.   Vision Screening - Comments:: Wears rx glasses - up to date with [facility] exams [facility]   Goals Addressed             This Visit's Progress    Patient Stated   On track    Lose some weight.       Depression Screen    12/24/2022    8:50 AM 11/03/2022    9:02 AM 08/04/2022    9:12 AM 07/16/2022    9:18 AM 07/14/2022    2:15 PM 04/28/2022    2:44 PM 03/03/2022    1:06  PM  PHQ 2/9 Scores  PHQ - 2 Score 0 0 0 0 0 0 0  PHQ- 9 Score 0 0 0 0 0 0     Fall Risk    12/24/2022    9:00 AM 11/03/2022    9:02 AM 08/04/2022    9:12 AM 07/16/2022    9:17 AM 07/14/2022    2:15 PM  Fall Risk    Falls in the past year? 0 0 0 0 0  Number falls in past yr: 0 0 0 0 0  Injury with Fall? 0 0 0 0 0  Risk for fall due to : No Fall Risks No Fall Risks No Fall Risks  No Fall Risks  Follow up Falls prevention discussed Falls evaluation completed Falls evaluation completed  Falls evaluation completed    MEDICARE RISK AT HOME: Medicare Risk at Home Any stairs in or around the home?: No If so, are there any without handrails?: No Home free of loose throw rugs in walkways, pet beds, electrical cords, etc?: Yes Adequate lighting in your home to reduce risk of falls?: Yes Life alert?: No Use of a cane, walker or w/c?: No Grab bars in the bathroom?: No Shower chair or bench in shower?: No Elevated toilet seat or a handicapped toilet?: No  TIMED UP AND GO:  Was the test performed?  No    Cognitive Function:    12/23/2021    8:38 AM  MMSE - Mini Mental State Exam  Not completed: Unable to complete        12/24/2022    8:48 AM 12/23/2021    8:38 AM  6CIT Screen  What Year? 0 points 0 points  What month? 0 points 0 points  What time? 0 points 0 points  Count back from 20 0 points 0 points  Months in reverse 0 points 0 points  Repeat phrase 0 points 0 points  Total Score 0 points 0 points    Immunizations Immunization History  Administered Date(s) Administered   Fluad Quad(high Dose 65+) 01/07/2022   Influenza,inj,Quad PF,6+ Mos 02/17/2014   Moderna Sars-Covid-2 Vaccination 05/28/2019, 06/26/2019   Pneumococcal Polysaccharide-23 06/09/2014    TDAP status: Due, Education has been provided regarding the importance of this vaccine. Advised may receive this vaccine at local pharmacy or Health Dept. Aware to provide a copy of the vaccination record if obtained from local pharmacy or Health Dept. Verbalized acceptance and understanding.  Flu Vaccine status: Due, Education has been provided regarding the importance of this vaccine. Advised may receive this vaccine at local pharmacy  or Health Dept. Aware to provide a copy of the vaccination record if obtained from local pharmacy or Health Dept. Verbalized acceptance and understanding.  Pneumococcal vaccine status: Due, Education has been provided regarding the importance of this vaccine. Advised may receive this vaccine at local pharmacy or Health Dept. Aware to provide a copy of the vaccination record if obtained from local pharmacy or Health Dept. Verbalized acceptance and understanding.  Covid-19 vaccine status: Information provided on how to obtain vaccines.   Qualifies for Shingles Vaccine? Yes   Shingrix Vaccine: Patient declined vaccine.   Screening Tests Health Maintenance  Topic Date Due   DTaP/Tdap/Td (1 - Tdap) Never done   Zoster Vaccines- Shingrix (1 of 2) Never done   DEXA SCAN  08/29/2018   Pneumonia Vaccine 75+ Years old (2 of 2 - PCV) 02/17/2019   COVID-19 Vaccine (3 - 2023-24 season) 12/14/2022   INFLUENZA VACCINE  11/13/2023 (  Originally 11/13/2022)   MAMMOGRAM  01/16/2023   Medicare Annual Wellness (AWV)  12/24/2023   Colonoscopy  10/23/2031   Hepatitis C Screening  Completed   HPV VACCINES  Aged Out    Health Maintenance  Health Maintenance Due  Topic Date Due   DTaP/Tdap/Td (1 - Tdap) Never done   Zoster Vaccines- Shingrix (1 of 2) Never done   DEXA SCAN  08/29/2018   Pneumonia Vaccine 79+ Years old (2 of 2 - PCV) 02/17/2019   COVID-19 Vaccine (3 - 2023-24 season) 12/14/2022    Colorectal cancer screening: Type of screening: Colonoscopy. Completed 10/22/2021. Repeat every 10 years  Mammogram status: Ordered 12/24/2022. Pt provided with contact info and advised to call to schedule appt.   Bone Density status: Ordered 12/24/2022. Pt provided with contact info and advised to call to schedule appt.  Lung Cancer Screening: (Low Dose CT Chest recommended if Age 90-80 years, 20 pack-year currently smoking OR have quit w/in 15years.) does not qualify.   Lung Cancer Screening Referral:  na  Additional Screening:  Hepatitis C Screening: does not qualify; Completed 06/27/2014  Vision Screening: Recommended annual ophthalmology exams for early detection of glaucoma and other disorders of the eye. Is the patient up to date with their annual eye exam?  Yes  Who is the provider or what is the name of the office in which the patient attends annual eye exams? [facility] [place]  Dental Screening: Recommended annual dental exams for proper oral hygiene  Diabetic Foot Exam: na  Community Resource Referral / Chronic Care Management: CRR required this visit?  No   CCM required this visit?  No     Plan:     I have personally reviewed and noted the following in the patient's chart:   Medical and social history Use of alcohol, tobacco or illicit drugs  Current medications and supplements including opioid prescriptions. Patient is currently taking opioid prescriptions. Information provided to patient regarding non-opioid alternatives. Patient advised to discuss non-opioid treatment plan with their provider. Functional ability and status Nutritional status Physical activity Advanced directives List of other physicians Hospitalizations, surgeries, and ER visits in previous 12 months Vitals Screenings to include cognitive, depression, and falls Referrals and appointments  In addition, I have reviewed and discussed with patient certain preventive protocols, quality metrics, and best practice recommendations. A written personalized care plan for preventive services as well as general preventive health recommendations were provided to patient.     Sandra Roy, CMA   12/24/2022   After Visit Summary: (MyChart) Due to this being a telephonic visit, the after visit summary with patients personalized plan was offered to patient via MyChart   Nurse Notes:

## 2023-01-08 DIAGNOSIS — S0502XA Injury of conjunctiva and corneal abrasion without foreign body, left eye, initial encounter: Secondary | ICD-10-CM | POA: Diagnosis not present

## 2023-01-19 ENCOUNTER — Other Ambulatory Visit: Payer: Self-pay | Admitting: Internal Medicine

## 2023-01-19 DIAGNOSIS — F419 Anxiety disorder, unspecified: Secondary | ICD-10-CM

## 2023-03-04 ENCOUNTER — Other Ambulatory Visit: Payer: Self-pay | Admitting: Internal Medicine

## 2023-03-04 DIAGNOSIS — F419 Anxiety disorder, unspecified: Secondary | ICD-10-CM

## 2023-04-27 DIAGNOSIS — T7522XD Traumatic vasospastic syndrome, subsequent encounter: Secondary | ICD-10-CM | POA: Diagnosis not present

## 2023-04-27 DIAGNOSIS — I7300 Raynaud's syndrome without gangrene: Secondary | ICD-10-CM | POA: Diagnosis not present

## 2023-04-27 DIAGNOSIS — G5602 Carpal tunnel syndrome, left upper limb: Secondary | ICD-10-CM | POA: Diagnosis not present

## 2023-04-27 DIAGNOSIS — R2231 Localized swelling, mass and lump, right upper limb: Secondary | ICD-10-CM | POA: Diagnosis not present

## 2023-05-07 ENCOUNTER — Telehealth: Payer: Self-pay | Admitting: Internal Medicine

## 2023-05-07 ENCOUNTER — Ambulatory Visit (INDEPENDENT_AMBULATORY_CARE_PROVIDER_SITE_OTHER): Payer: Medicare Other | Admitting: Internal Medicine

## 2023-05-07 ENCOUNTER — Encounter: Payer: Self-pay | Admitting: Internal Medicine

## 2023-05-07 ENCOUNTER — Other Ambulatory Visit: Payer: Self-pay | Admitting: Internal Medicine

## 2023-05-07 VITALS — BP 130/80 | HR 77 | Ht 64.0 in | Wt 190.6 lb

## 2023-05-07 DIAGNOSIS — E66811 Obesity, class 1: Secondary | ICD-10-CM

## 2023-05-07 DIAGNOSIS — E063 Autoimmune thyroiditis: Secondary | ICD-10-CM | POA: Diagnosis not present

## 2023-05-07 DIAGNOSIS — E782 Mixed hyperlipidemia: Secondary | ICD-10-CM | POA: Diagnosis not present

## 2023-05-07 DIAGNOSIS — E559 Vitamin D deficiency, unspecified: Secondary | ICD-10-CM | POA: Diagnosis not present

## 2023-05-07 DIAGNOSIS — Z0000 Encounter for general adult medical examination without abnormal findings: Secondary | ICD-10-CM | POA: Diagnosis not present

## 2023-05-07 MED ORDER — QSYMIA 7.5-46 MG PO CP24: 1.0000 | ORAL_CAPSULE | Freq: Every day | ORAL | 0 refills | Status: DC

## 2023-05-07 NOTE — Telephone Encounter (Signed)
Copied from CRM [id number]. Topic: Clinical - Prescription Issue >> May 07, 2023 10:16 AM Patsy Lager T wrote: Reason for CRM: Patient called stated [facility] did not have the medication but script for Phentermine-Topiramate (QSYMIA) 7.5-46 MG CP24 needed to be sent to:  [id number] - [place], [place] - [address] AT [address]  Phone: [phone] Fax: [phone]  Patient request a call @ [phone] once the script has been xferred.

## 2023-05-07 NOTE — Assessment & Plan Note (Signed)
Her acute concern today is obesity.  Current weight 190 pounds.  BMI 32.7.  We previously tried [place] and Zepbound but they are not covered by insurance.  She has focused on lifestyle modifications aimed at weight loss, namely dietary changes and maintaining regular exercise, but expresses frustration today with continued weight gain and a lack of results.  She remains interested in medication options for weight loss.  She took Qsymia previously, which was effective. -Through shared decision making, Qsymia was prescribed today.  Lifestyle modifications aimed at weight loss were reinforced.  She will return to care in 3 months for weight loss management.

## 2023-05-07 NOTE — Telephone Encounter (Signed)
Medication sent to pharmacy by provider this AM.

## 2023-05-07 NOTE — Patient Instructions (Signed)
It was a pleasure to see you today.  Thank you for giving [place] the opportunity to be involved in your care.  Below is a brief recap of your visit and next steps.  We will plan to see you again in 3 months.  Summary Start Qsymia for weight loss Repeat labs ordered Follow up in 3 months

## 2023-05-07 NOTE — Assessment & Plan Note (Signed)
She continues to take levothyroxine 88 mcg daily.  She remains asymptomatic.  Repeat thyroid studies ordered today.

## 2023-05-07 NOTE — Telephone Encounter (Signed)
Needs Phentermine-Topiramate (QSYMIA) 7.5-46 MG CP24 [[id number]]   Sent to [facility]

## 2023-05-07 NOTE — Progress Notes (Signed)
Established Patient Office Visit  Subjective   Patient ID: Sandra Roy, female    DOB: 1955-11-18  Age: 68 y.o. MRN: [id number]  Chief Complaint  Patient presents with   Hyperlipidemia    Six month follow up    Ms. Sandra Roy returns to care today for routine follow-up.  She was last evaluated by me in July 2024.  No medication changes were made at that time and 39-month follow-up was arranged.  There have been no acute interval events.  She reports feeling well today.  Her acute concern today is weight gain.  Her weight today is 190 pounds.  She weighed 178 pounds in September.  She expresses frustration because she has attempted to adhere to dietary changes and regular exercise but is not seeing results.  She remains interested in medication options for weight loss.  Past Medical History:  Diagnosis Date   Adnexal mass 05/2000   Left   Anemia    history of   Anxiety    Autoimmune thyroiditis    Carpal tunnel syndrome    Right   Depression    Factor V Leiden mutation (HCC)    Carrier   Foot fracture, left 05/06/2017   Hashimoto's disease    Hemorrhoids 04/25/2004   Mild external and internal noted on colonoscopy   History of colon polyps 04/25/2004   History of gastritis 04/25/2004   History of hiatal hernia 06/21/2002   History of menorrhagia    Hypoglycemic syndrome    Hypothyroidism    Lipoma    Bilateral ankles   NSVD (normal spontaneous vaginal delivery)    x3   OA (osteoarthritis)    Osteopenia    Osteopenia    Mild   PONV (postoperative nausea and vomiting)    Hysterectomy surgery blood pressure dropped   Thyroid nodule 03/23/2013   Bilateral 4mm , noted on [place]   Vasovagal syncope    salty drinks help (gatorade)   Vitamin D deficiency    Past Surgical History:  Procedure Laterality Date   ABDOMINAL HYSTERECTOMY  2003   TAH/BSO   ANKLE SURGERY Bilateral 09/18/2017   excision lipomas    BLEPHAROPLASTY Bilateral    CARPAL TUNNEL RELEASE Right 2003    COLONOSCOPY     DILATION AND CURETTAGE OF UTERUS     ENDOMETRIAL ABLATION     KNEE ARTHROSCOPY  2010   RIGHT   LIPOMA EXCISION Bilateral    2019   PELVIC LAPAROSCOPY     LSO   TOTAL KNEE ARTHROPLASTY Right 02/12/2018   Procedure: RIGHT TOTAL KNEE ARTHROPLASTY;  Surgeon: Eugenia Mcalpine, MD;  Location: [facility];  Service: Orthopedics;  Laterality: Right;   TUBAL LIGATION  1976   UPPER GI ENDOSCOPY     Social History   Tobacco Use   Smoking status: Former    Current packs/day: 0.00    Types: Cigarettes    Quit date: 06/17/1981    Years since quitting: 41.9   Smokeless tobacco: Never  Vaping Use   Vaping status: Never Used  Substance Use Topics   Alcohol use: No    Alcohol/week: 0.0 standard drinks of alcohol   Drug use: No   Family History  Problem Relation Age of Onset   Diabetes Father    Heart disease Father    Hypertension Sister    Cancer Sister        LUNG  (SMOKER)   Diabetes Brother    Hypertension Brother    Breast cancer  Neg Hx    Allergies  Allergen Reactions   Adhesive [Tape]     blisters   Aminoquinolines Other (See Comments)    Unknown   Azithromycin Other (See Comments)    Unknown   Bactrim [Sulfamethoxazole-Trimethoprim] Other (See Comments)    Unknown   Clarithromycin Other (See Comments)    UTI   Naproxen Sodium Other (See Comments)    Acid reflux   Other Swelling    Monocryl stitch 5-0 Plain Catgut suture   Vicodin [Hydrocodone-Acetaminophen] Nausea And Vomiting   Review of Systems  Constitutional:  Negative for chills and fever.  HENT:  Negative for sore throat.   Respiratory:  Negative for cough and shortness of breath.   Cardiovascular:  Negative for chest pain, palpitations and leg swelling.  Gastrointestinal:  Negative for abdominal pain, blood in stool, constipation, diarrhea, nausea and vomiting.  Genitourinary:  Negative for dysuria and hematuria.  Musculoskeletal:  Negative for myalgias.  Skin:  Negative for itching and rash.   Neurological:  Negative for dizziness and headaches.  Psychiatric/Behavioral:  Negative for depression and suicidal ideas.      Objective:     BP 130/80 (BP Location: Left Arm, Patient Position: Sitting, Cuff Size: Normal)   Pulse 77   Ht 5\' 4"  (1.626 m)   Wt 190 lb 9.6 oz (86.5 kg)   SpO2 94%   BMI 32.72 kg/m  BP Readings from Last 3 Encounters:  05/07/23 130/80  11/03/22 121/75  08/04/22 (!) 146/73   Physical Exam Vitals reviewed.  Constitutional:      General: She is not in acute distress.    Appearance: Normal appearance. She is obese. She is not toxic-appearing.  HENT:     Head: Normocephalic and atraumatic.     Right Ear: External ear normal.     Left Ear: External ear normal.     Nose: Nose normal. No congestion or rhinorrhea.     Mouth/Throat:     Mouth: Mucous membranes are moist.     Pharynx: Oropharynx is clear. No oropharyngeal exudate or posterior oropharyngeal erythema.  Eyes:     General: No scleral icterus.    Extraocular Movements: Extraocular movements intact.     Conjunctiva/sclera: Conjunctivae normal.     Pupils: Pupils are equal, round, and reactive to light.  Cardiovascular:     Rate and Rhythm: Normal rate and regular rhythm.     Pulses: Normal pulses.     Heart sounds: Normal heart sounds. No murmur heard.    No friction rub. No gallop.  Pulmonary:     Effort: Pulmonary effort is normal.     Breath sounds: Normal breath sounds. No wheezing, rhonchi or rales.  Abdominal:     General: Abdomen is flat. Bowel sounds are normal. There is no distension.     Palpations: Abdomen is soft.     Tenderness: There is no abdominal tenderness.  Musculoskeletal:        General: No swelling. Normal range of motion.     Cervical back: Normal range of motion.     Right lower leg: No edema.     Left lower leg: No edema.  Lymphadenopathy:     Cervical: No cervical adenopathy.  Skin:    General: Skin is warm and dry.     Capillary Refill: Capillary  refill takes less than 2 seconds.     Coloration: Skin is not jaundiced.  Neurological:     General: No focal deficit present.  Mental Status: She is alert and oriented to person, place, and time.  Psychiatric:        Mood and Affect: Mood normal.        Behavior: Behavior normal.   Last CBC Lab Results  Component Value Date   WBC 6.0 02/10/2022   HGB 13.3 02/10/2022   HCT 40.1 02/10/2022   MCV 95 02/10/2022   MCH 31.4 02/10/2022   RDW 13.1 02/10/2022   PLT 247 02/10/2022   Last metabolic panel Lab Results  Component Value Date   GLUCOSE CANCELED 02/10/2022   NA 146 (H) 02/10/2022   K CANCELED 02/10/2022   CL 108 (H) 02/10/2022   CO2 20 02/10/2022   BUN 11 02/10/2022   CREATININE 0.66 02/10/2022   EGFR 96 02/10/2022   CALCIUM 9.0 02/10/2022   PROT 6.8 02/10/2022   ALBUMIN 4.5 02/10/2022   LABGLOB 2.3 02/10/2022   AGRATIO 2.0 02/10/2022   BILITOT <0.2 02/10/2022   ALKPHOS 105 02/10/2022   AST 20 02/10/2022   ALT 23 02/10/2022   ANIONGAP 7 02/13/2018   Last lipids Lab Results  Component Value Date   CHOL 175 11/03/2022   HDL 54 11/03/2022   LDLCALC 102 (H) 11/03/2022   TRIG 103 11/03/2022   CHOLHDL 3.2 11/03/2022   Last hemoglobin A1c Lab Results  Component Value Date   HGBA1C 5.3 08/04/2022   Last thyroid functions Lab Results  Component Value Date   TSH 1.400 12/03/2021   T4TOTAL 7.6 06/18/2011   Last vitamin D Lab Results  Component Value Date   VD25OH 46.3 12/03/2021   Last vitamin B12 and Folate Lab Results  Component Value Date   VITAMINB12 329 12/03/2021   FOLATE 10.3 12/03/2021   The 10-year ASCVD risk score (Arnett DK, et al., 2019) is: 8.4%    Assessment & Plan:   Problem List Items Addressed This Visit       Hypothyroidism due to Hashimoto's thyroiditis   She continues to take levothyroxine 88 mcg daily.  She remains asymptomatic.  Repeat thyroid studies ordered today.      Obesity (BMI 30.0-34.9) - Primary   Her acute  concern today is obesity.  Current weight 190 pounds.  BMI 32.7.  We previously tried [place] and Zepbound but they are not covered by insurance.  She has focused on lifestyle modifications aimed at weight loss, namely dietary changes and maintaining regular exercise, but expresses frustration today with continued weight gain and a lack of results.  She remains interested in medication options for weight loss.  She took Qsymia previously, which was effective. -Through shared decision making, Qsymia was prescribed today.  Lifestyle modifications aimed at weight loss were reinforced.  She will return to care in 3 months for weight loss management.       Return in about 3 months (around 08/05/2023).    Billie Lade, MD

## 2023-05-08 ENCOUNTER — Encounter: Payer: Self-pay | Admitting: Internal Medicine

## 2023-05-08 LAB — CBC WITH DIFFERENTIAL/PLATELET
Basophils Absolute: 0 10*3/uL (ref 0.0–0.2)
Basos: 0 %
EOS (ABSOLUTE): 0.1 10*3/uL (ref 0.0–0.4)
Eos: 2 %
Hematocrit: 40.7 % (ref 34.0–46.6)
Hemoglobin: 13.6 g/dL (ref 11.1–15.9)
Immature Grans (Abs): 0 10*3/uL (ref 0.0–0.1)
Immature Granulocytes: 0 %
Lymphocytes Absolute: 2 10*3/uL (ref 0.7–3.1)
Lymphs: 34 %
MCH: 31.5 pg (ref 26.6–33.0)
MCHC: 33.4 g/dL (ref 31.5–35.7)
MCV: 94 fL (ref 79–97)
Monocytes Absolute: 0.4 10*3/uL (ref 0.1–0.9)
Monocytes: 7 %
Neutrophils Absolute: 3.2 10*3/uL (ref 1.4–7.0)
Neutrophils: 57 %
Platelets: 240 10*3/uL (ref 150–450)
RBC: 4.32 x10E6/uL (ref 3.77–5.28)
RDW: 12.3 % (ref 11.7–15.4)
WBC: 5.7 10*3/uL (ref 3.4–10.8)

## 2023-05-08 LAB — CMP14+EGFR
ALT: 17 [IU]/L (ref 0–32)
AST: 22 [IU]/L (ref 0–40)
Albumin: 4.4 g/dL (ref 3.9–4.9)
Alkaline Phosphatase: 113 [IU]/L (ref 44–121)
BUN/Creatinine Ratio: 16 (ref 12–28)
BUN: 10 mg/dL (ref 8–27)
Bilirubin Total: 0.3 mg/dL (ref 0.0–1.2)
CO2: 22 mmol/L (ref 20–29)
Calcium: 9.1 mg/dL (ref 8.7–10.3)
Chloride: 104 mmol/L (ref 96–106)
Creatinine, Ser: 0.61 mg/dL (ref 0.57–1.00)
Globulin, Total: 2.4 g/dL (ref 1.5–4.5)
Glucose: 85 mg/dL (ref 70–99)
Potassium: 4.2 mmol/L (ref 3.5–5.2)
Sodium: 142 mmol/L (ref 134–144)
Total Protein: 6.8 g/dL (ref 6.0–8.5)
eGFR: 97 mL/min/{1.73_m2} (ref >59)

## 2023-05-08 LAB — LIPID PANEL
Chol/HDL Ratio: 3.2 {ratio} (ref 0.0–4.4)
Cholesterol, Total: 187 mg/dL (ref 100–199)
HDL: 58 mg/dL (ref >39)
LDL Chol Calc (NIH): 107 mg/dL — ABNORMAL HIGH (ref 0–99)
Triglycerides: 123 mg/dL (ref 0–149)
VLDL Cholesterol Cal: 22 mg/dL (ref 5–40)

## 2023-05-08 LAB — TSH+FREE T4
Free T4: 1.27 ng/dL (ref 0.82–1.77)
TSH: 2.15 u[IU]/mL (ref 0.450–4.500)

## 2023-05-08 LAB — HEMOGLOBIN A1C
Est. average glucose Bld gHb Est-mCnc: 120 mg/dL
Hgb A1c MFr Bld: 5.8 % — ABNORMAL HIGH (ref 4.8–5.6)

## 2023-05-08 LAB — B12 AND FOLATE PANEL
Folate: 8.2 ng/mL (ref >3.0)
Vitamin B-12: 327 pg/mL (ref 232–1245)

## 2023-05-08 LAB — VITAMIN D 25 HYDROXY (VIT D DEFICIENCY, FRACTURES): Vit D, 25-Hydroxy: 27 ng/mL — ABNORMAL LOW (ref 30.0–100.0)

## 2023-05-08 NOTE — Telephone Encounter (Signed)
Patient advised.

## 2023-05-11 NOTE — Telephone Encounter (Unsigned)
Copied from CRM [id number]. Topic: Clinical - Prescription Issue >> May 11, 2023 12:08 PM [name] wrote: Reason for CRM: Phentermine-Topiramate [facility]) 7.5-46 MG CP24, Patient stated pharmacy cannot dispense medication until the doctor sends paperwork about how much this will cost for the patient. Sandra Roy is this is regarding a prior auth . Please look in this and contact patient.

## 2023-05-11 NOTE — Telephone Encounter (Signed)
PA submitted.

## 2023-05-15 ENCOUNTER — Other Ambulatory Visit: Payer: Self-pay | Admitting: Internal Medicine

## 2023-05-15 DIAGNOSIS — F419 Anxiety disorder, unspecified: Secondary | ICD-10-CM

## 2023-05-23 ENCOUNTER — Other Ambulatory Visit: Payer: Self-pay | Admitting: Internal Medicine

## 2023-05-23 DIAGNOSIS — K219 Gastro-esophageal reflux disease without esophagitis: Secondary | ICD-10-CM

## 2023-05-27 ENCOUNTER — Telehealth (INDEPENDENT_AMBULATORY_CARE_PROVIDER_SITE_OTHER): Payer: Medicare Other | Admitting: Adult Health

## 2023-05-27 ENCOUNTER — Ambulatory Visit: Payer: Self-pay | Admitting: Internal Medicine

## 2023-05-27 ENCOUNTER — Encounter: Payer: Self-pay | Admitting: Adult Health

## 2023-05-27 VITALS — Wt 184.0 lb

## 2023-05-27 DIAGNOSIS — J0111 Acute recurrent frontal sinusitis: Secondary | ICD-10-CM

## 2023-05-27 MED ORDER — PROMETHAZINE-DM 6.25-15 MG/5ML PO SYRP: 2.5000 mL | ORAL_SOLUTION | Freq: Four times a day (QID) | ORAL | 0 refills | Status: DC | PRN

## 2023-05-27 MED ORDER — AMOXICILLIN 500 MG PO CAPS: 500.0000 mg | ORAL_CAPSULE | Freq: Three times a day (TID) | ORAL | 0 refills | Status: AC

## 2023-05-27 NOTE — Progress Notes (Signed)
Virtual Visit via Video Note  I connected with Sandra Roy  on 05/27/23 at 11:00 AM EST by a video enabled telemedicine application and verified that I am speaking with the correct person using two identifiers.  Location patient: home Location provider:work or home office Persons participating in the virtual visit: patient, provider  I discussed the limitations of evaluation and management by telemedicine and the availability of in person appointments. The patient expressed understanding and agreed to proceed.   HPI:  68 year old female who is being evaluated today for an acute issue.  She reports that over the last 2 months she has had sinus drainage.  Over the last week her symptoms have become worse she has developed cough that is semiproductive with cloudy white mucus and nasal drainage, she has a headache and sinus pressure above her eyes.  She has not had any body aches, fevers, chills, shortness of breath, wheezing, chest pain, or chest tightness.  Over-the-counter medications have not been helping.  ROS: See pertinent positives and negatives per HPI.  Past Medical History:  Diagnosis Date   Adnexal mass 05/2000   Left   Anemia    history of   Anxiety    Autoimmune thyroiditis    Carpal tunnel syndrome    Right   Depression    Factor V Leiden mutation (HCC)    Carrier   Foot fracture, left 05/06/2017   Hashimoto's disease    Hemorrhoids 04/25/2004   Mild external and internal noted on colonoscopy   History of colon polyps 04/25/2004   History of gastritis 04/25/2004   History of hiatal hernia 06/21/2002   History of menorrhagia    Hypoglycemic syndrome    Hypothyroidism    Lipoma    Bilateral ankles   NSVD (normal spontaneous vaginal delivery)    x3   OA (osteoarthritis)    Osteopenia    Osteopenia    Mild   PONV (postoperative nausea and vomiting)    Hysterectomy surgery blood pressure dropped   Thyroid nodule 03/23/2013   Bilateral 4mm , noted on [place]    Vasovagal syncope    salty drinks help (gatorade)   Vitamin D deficiency     Past Surgical History:  Procedure Laterality Date   ABDOMINAL HYSTERECTOMY  2003   TAH/BSO   ANKLE SURGERY Bilateral 09/18/2017   excision lipomas    BLEPHAROPLASTY Bilateral    CARPAL TUNNEL RELEASE Right 2003   COLONOSCOPY     DILATION AND CURETTAGE OF UTERUS     ENDOMETRIAL ABLATION     KNEE ARTHROSCOPY  2010   RIGHT   LIPOMA EXCISION Bilateral    2019   PELVIC LAPAROSCOPY     LSO   TOTAL KNEE ARTHROPLASTY Right 02/12/2018   Procedure: RIGHT TOTAL KNEE ARTHROPLASTY;  Surgeon: Eugenia Mcalpine, MD;  Location: [facility];  Service: Orthopedics;  Laterality: Right;   TUBAL LIGATION  1976   UPPER GI ENDOSCOPY      Family History  Problem Relation Age of Onset   Diabetes Father    Heart disease Father    Hypertension Sister    Cancer Sister        LUNG  (SMOKER)   Diabetes Brother    Hypertension Brother    Breast cancer Neg Hx        Current Outpatient Medications:    amoxicillin (AMOXIL) 500 MG capsule, Take 1 capsule (500 mg total) by mouth 3 (three) times daily for 5 days., Disp: 15 capsule, Rfl: 0  ALPRAZolam (XANAX) 0.25 MG tablet, Take 1 tablet (0.25 mg total) by mouth at bedtime as needed for anxiety., Disp: 10 tablet, Rfl: 0   benzonatate (TESSALON) 200 MG capsule, Take 1 capsule (200 mg total) by mouth 2 (two) times daily as needed for cough., Disp: 20 capsule, Rfl: 0   Cholecalciferol (VITAMIN D3) 2000 units CHEW, Chew 4,000 Units by mouth daily., Disp: , Rfl:    diclofenac (VOLTAREN) 75 MG EC tablet, TAKE (1) TABLET BY MOUTH TWICE DAILY., Disp: 120 tablet, Rfl: 0   fluticasone (FLONASE) 50 MCG/ACT nasal spray, Place 1 spray into both nostrils daily as needed for allergies., Disp: 9.9 mL, Rfl: 2   levothyroxine (SYNTHROID) 88 MCG tablet, Take 1 tablet (88 mcg total) by mouth daily before breakfast., Disp: 90 tablet, Rfl: 1   Melatonin 5 MG CHEW, Chew 2 tablets by mouth at bedtime.,  Disp: , Rfl:    Multiple Vitamins-Minerals (MULTIVITAMIN PO), Take 3 tablets by mouth daily., Disp: , Rfl:    omeprazole (PRILOSEC) 40 MG capsule, TAKE ONE CAPSULE BY MOUTH ONCE DAILY., Disp: 90 capsule, Rfl: 1   Phentermine-Topiramate (QSYMIA) 7.5-46 MG CP24, Take 1 capsule by mouth daily., Disp: 30 capsule, Rfl: 0   promethazine-dextromethorphan (PROMETHAZINE-DM) 6.25-15 MG/5ML syrup, Take 2.5 mLs by mouth 4 (four) times daily as needed for cough., Disp: 118 mL, Rfl: 0  EXAM:  VITALS per patient if applicable:  GENERAL: alert, oriented, appears well and in no acute distress  HEENT: atraumatic, conjunttiva clear, no obvious abnormalities on inspection of external nose and ears  NECK: normal movements of the head and neck  LUNGS: on inspection no signs of respiratory distress, breathing rate appears normal, no obvious gross SOB, gasping or wheezing  CV: no obvious cyanosis  MS: moves all visible extremities without noticeable abnormality  PSYCH/NEURO: pleasant and cooperative, no obvious depression or anxiety, speech and thought processing grossly intact  ASSESSMENT AND PLAN:  Discussed the following assessment and plan:  There are no diagnoses linked to this encounter.  1. Acute recurrent frontal sinusitis (Primary) -Will treat for bacterial sinusitis with amoxicillin and Promethazine DM cough syrup.  She has had both of these medications in the past and tolerated them well.   -Follow-up if symptoms not resolving by the time antibiotics have been completed or sooner if fever develops.  - amoxicillin (AMOXIL) 500 MG capsule; Take 1 capsule (500 mg total) by mouth 3 (three) times daily for 5 days.  Dispense: 15 capsule; Refill: 0 - promethazine-dextromethorphan (PROMETHAZINE-DM) 6.25-15 MG/5ML syrup; Take 2.5 mLs by mouth 4 (four) times daily as needed for cough.  Dispense: 118 mL; Refill: 0  Shirline Frees, NP    I discussed the assessment and treatment plan with the patient.  The patient was provided an opportunity to ask questions and all were answered. The patient agreed with the plan and demonstrated an understanding of the instructions.   The patient was advised to call back or seek an in-person evaluation if the symptoms worsen or if the condition fails to improve as anticipated.   Shirline Frees, NP

## 2023-05-27 NOTE — Telephone Encounter (Signed)
Copied from CRM [id number]. Topic: Clinical - Red Word Triage >> May 27, 2023  9:48 AM Elle L wrote: Red Word that prompted transfer to Nurse Triage: The patient believes she has bronchitis. She has a deep cough and coughing phelgm, and a headache.   Chief Complaint: Cough Symptoms: Productive cough Frequency: Ongoing since the beginning of the week Pertinent Negatives: Patient denies fever Disposition: [] ED /[] Urgent Care (no appt availability in office) / [x] Appointment(In office/virtual)/ []  [facility]/ [] Home Care/ [] Refused Recommended Disposition /[] [facility]/ []  Follow-up with PCP Additional Notes: Patient stated she is having a bad cough that has been going on since the beginning of the week. The cough is productive with white phlegm and patient is suspecting bronchitis. Patient requested virtual visit. Appointment scheduled for this morning.   Reason for Disposition  [1] Continuous (nonstop) coughing interferes with work or school AND [2] no improvement using cough treatment per Care Advice  Answer Assessment - Initial Assessment Questions 1. ONSET: "When did the cough begin?"      Beginning of this week  2. SEVERITY: "How bad is the cough today?"      Constant   3. SPUTUM: "Describe the color of your sputum" (none, dry cough; clear, white, yellow, green)     White  4. DIFFICULTY BREATHING: "Are you having difficulty breathing?" If Yes, ask: "How bad is it?" (e.g., mild, moderate, severe)    - MILD: No SOB at rest, mild SOB with walking, speaks normally in sentences, can lie down, no retractions, pulse < 100.    - MODERATE: SOB at rest, SOB with minimal exertion and prefers to sit, cannot lie down flat, speaks in phrases, mild retractions, audible wheezing, pulse 100-120.    - SEVERE: Very SOB at rest, speaks in single words, struggling to breathe, sitting hunched forward, retractions, pulse > 120      A little bit of SOB with coughing only  5. FEVER:  "Do you have a fever?" If Yes, ask: "What is your temperature, how was it measured, and when did it start?"     No  6. OTHER SYMPTOMS: "Do you have any other symptoms?" (e.g., runny nose, wheezing, chest pain)       Sharp headaches once in a while, do not last long. No headache now  Protocols used: Cough - Acute Productive-A-AH

## 2023-05-30 DIAGNOSIS — R059 Cough, unspecified: Secondary | ICD-10-CM | POA: Diagnosis not present

## 2023-05-30 DIAGNOSIS — R030 Elevated blood-pressure reading, without diagnosis of hypertension: Secondary | ICD-10-CM | POA: Diagnosis not present

## 2023-06-09 ENCOUNTER — Other Ambulatory Visit: Payer: Self-pay | Admitting: Internal Medicine

## 2023-06-09 DIAGNOSIS — E66811 Obesity, class 1: Secondary | ICD-10-CM

## 2023-07-16 ENCOUNTER — Other Ambulatory Visit: Payer: Self-pay | Admitting: Internal Medicine

## 2023-07-16 DIAGNOSIS — E66811 Obesity, class 1: Secondary | ICD-10-CM

## 2023-07-16 DIAGNOSIS — F419 Anxiety disorder, unspecified: Secondary | ICD-10-CM

## 2023-07-16 MED ORDER — QSYMIA 7.5-46 MG PO CP24: 1.0000 | ORAL_CAPSULE | Freq: Every day | ORAL | 0 refills | Status: DC

## 2023-07-16 MED ORDER — ALPRAZOLAM 0.25 MG PO TABS: 0.2500 mg | ORAL_TABLET | Freq: Every evening | ORAL | 0 refills | Status: DC | PRN

## 2023-07-16 NOTE — Telephone Encounter (Signed)
 Copied from CRM [id number]. Topic: Clinical - Medication Refill >> Jul 16, 2023  9:58 AM Franchot Heidelberg wrote: Most Recent Primary Care Visit:  Provider: Shirline Frees  Department: [facility]  Visit Type: ACUTE  Date: 05/27/2023  Medication: Phentermine-Topiramate (QSYMIA) 7.5-46 MG CP24 ALPRAZolam (XANAX) 0.25 MG tablet [   Has the patient contacted their pharmacy? Yes (Agent: If no, request that the patient contact the pharmacy for the refill. If patient does not wish to contact the pharmacy document the reason why and proceed with request.) (Agent: If yes, when and what did the pharmacy advise?)  Is this the correct pharmacy for this prescription? Yes If no, delete pharmacy and type the correct one.  This is the patient's preferred pharmacy:  [id number] - [place], [place] - [address] [place] [address] Phone: [phone] Fax: [phone]   Has the prescription been filled recently? Yes  Is the patient out of the medication? Yes  Has the patient been seen for an appointment in the last year OR does the patient have an upcoming appointment? Yes  Can we respond through MyChart? Yes  Agent: Please be advised that Rx refills may take up to 3 business days. We ask that you follow-up with your pharmacy.

## 2023-07-17 ENCOUNTER — Telehealth: Payer: Self-pay | Admitting: Pharmacy Technician

## 2023-07-17 ENCOUNTER — Other Ambulatory Visit (HOSPITAL_COMMUNITY): Payer: Self-pay

## 2023-07-17 NOTE — Telephone Encounter (Signed)
 Pharmacy Patient Advocate Encounter   Received notification from Patient Pharmacy that prior authorization for Qsymia 7.5-46MG  er capsules is required/requested.   Insurance verification completed.   The patient is insured through [employer] .   Per test claim:  Drug is excluded from Part D coverage. Patient has the option to purchase medication as self pay.

## 2023-08-07 ENCOUNTER — Ambulatory Visit: Payer: Medicare Other | Admitting: Internal Medicine

## 2023-08-17 ENCOUNTER — Ambulatory Visit (INDEPENDENT_AMBULATORY_CARE_PROVIDER_SITE_OTHER): Admitting: Internal Medicine

## 2023-08-17 ENCOUNTER — Encounter: Payer: Self-pay | Admitting: Internal Medicine

## 2023-08-17 VITALS — BP 118/75 | HR 82 | Ht 64.0 in | Wt 180.2 lb

## 2023-08-17 DIAGNOSIS — E063 Autoimmune thyroiditis: Secondary | ICD-10-CM

## 2023-08-17 DIAGNOSIS — Z23 Encounter for immunization: Secondary | ICD-10-CM | POA: Diagnosis not present

## 2023-08-17 DIAGNOSIS — E66811 Obesity, class 1: Secondary | ICD-10-CM

## 2023-08-17 DIAGNOSIS — B001 Herpesviral vesicular dermatitis: Secondary | ICD-10-CM | POA: Diagnosis not present

## 2023-08-17 DIAGNOSIS — F419 Anxiety disorder, unspecified: Secondary | ICD-10-CM | POA: Diagnosis not present

## 2023-08-17 MED ORDER — ALPRAZOLAM 0.25 MG PO TABS: 0.2500 mg | ORAL_TABLET | Freq: Every evening | ORAL | 0 refills | Status: DC | PRN

## 2023-08-17 MED ORDER — QSYMIA 7.5-46 MG PO CP24: 1.0000 | ORAL_CAPSULE | Freq: Every day | ORAL | 0 refills | Status: DC

## 2023-08-17 MED ORDER — LEVOTHYROXINE SODIUM 88 MCG PO TABS: 88.0000 ug | ORAL_TABLET | Freq: Every day | ORAL | 1 refills | Status: AC

## 2023-08-17 MED ORDER — VALACYCLOVIR HCL 1 G PO TABS: 1000.0000 mg | ORAL_TABLET | Freq: Every day | ORAL | 0 refills | Status: AC

## 2023-08-17 NOTE — Assessment & Plan Note (Signed)
 History of recurring fever blisters.  Valtrex  refilled today.

## 2023-08-17 NOTE — Patient Instructions (Signed)
 It was a pleasure to see you today.  Thank you for giving [place] the opportunity to be involved in your care.  Below is a brief recap of your visit and next steps.  We will plan to see you again in 3 months.  Summary No medication changes today Refills provided Follow up in 3 months

## 2023-08-17 NOTE — Assessment & Plan Note (Signed)
 She remains on levothyroxine  88 mcg daily.  Asymptomatic currently.  TSH WNL when updated in January.  Levothyroxine  refilled today.

## 2023-08-17 NOTE — Assessment & Plan Note (Signed)
 Presenting today for weight management follow-up.  Qsymia  was prescribed at her appointment 1/23.  Current weight 180 pounds.  BMI 30.9.  She has lost 10 pounds since her previous appointment and was congratulated on her progress.  Qsymia  refilled today.  Will continue at current dose.

## 2023-08-17 NOTE — Assessment & Plan Note (Signed)
 Alprazolam  0.25 mg nightly as needed for anxiety relief remains effective.  PDMP reviewed and is appropriate.  Refill provided today.

## 2023-08-17 NOTE — Progress Notes (Signed)
 Established Patient Office Visit  Subjective   Patient ID: Sandra Roy, female    DOB: Mar 30, 1956  Age: 68 y.o. MRN: [id number]  Chief Complaint  Patient presents with   Care Management    Three month follow up    fever blister    Requesting medication for her fever blisters   Sandra Roy returns to care today for routine follow-up.  She was last evaluated by me on 1/23 at which time Qsymia  was prescribed in the setting of obesity.  67-month follow-up was arranged for weight loss management.  There has been no acute interval events.  Today she reports feeling well and has no acute concerns discussed.  Her weight is down 10 pounds over the last 3 months.  Past Medical History:  Diagnosis Date   Adnexal mass 05/2000   Left   Anemia    history of   Anxiety    Autoimmune thyroiditis    Carpal tunnel syndrome    Right   Depression    Factor V Leiden mutation (HCC)    Carrier   Foot fracture, left 05/06/2017   Hashimoto's disease    Hemorrhoids 04/25/2004   Mild external and internal noted on colonoscopy   History of colon polyps 04/25/2004   History of gastritis 04/25/2004   History of hiatal hernia 06/21/2002   History of menorrhagia    Hypoglycemic syndrome    Hypothyroidism    Lipoma    Bilateral ankles   NSVD (normal spontaneous vaginal delivery)    x3   OA (osteoarthritis)    Osteopenia    Osteopenia    Mild   PONV (postoperative nausea and vomiting)    Hysterectomy surgery blood pressure dropped   Thyroid  nodule 03/23/2013   Bilateral 4mm , noted on US    Vasovagal syncope    salty drinks help (gatorade)   Vitamin D  deficiency    Past Surgical History:  Procedure Laterality Date   ABDOMINAL HYSTERECTOMY  2003   TAH/BSO   ANKLE SURGERY Bilateral 09/18/2017   excision lipomas    BLEPHAROPLASTY Bilateral    CARPAL TUNNEL RELEASE Right 2003   COLONOSCOPY     DILATION AND CURETTAGE OF UTERUS     ENDOMETRIAL ABLATION     KNEE ARTHROSCOPY  2010    RIGHT   LIPOMA EXCISION Bilateral    2019   PELVIC LAPAROSCOPY     LSO   TOTAL KNEE ARTHROPLASTY Right 02/12/2018   Procedure: RIGHT TOTAL KNEE ARTHROPLASTY;  Surgeon: Genevie Kerns, MD;  Location: [facility];  Service: Orthopedics;  Laterality: Right;   TUBAL LIGATION  1976   UPPER GI ENDOSCOPY     Social History   Tobacco Use   Smoking status: Former    Current packs/day: 0.00    Types: Cigarettes    Quit date: 06/17/1981    Years since quitting: 42.1   Smokeless tobacco: Never  Vaping Use   Vaping status: Never Used  Substance Use Topics   Alcohol use: No    Alcohol/week: 0.0 standard drinks of alcohol   Drug use: No   Family History  Problem Relation Age of Onset   Diabetes Father    Heart disease Father    Hypertension Sister    Cancer Sister        LUNG  (SMOKER)   Diabetes Brother    Hypertension Brother    Breast cancer Neg Hx    Allergies  Allergen Reactions   Adhesive [Tape]  blisters   Aminoquinolines Other (See Comments)    Unknown   Azithromycin  Other (See Comments)    Unknown   Bactrim [Sulfamethoxazole-Trimethoprim] Other (See Comments)    Unknown   Clarithromycin Other (See Comments)    UTI   Naproxen Sodium Other (See Comments)    Acid reflux   Other Swelling    Monocryl stitch 5-0 Plain Catgut suture   Vicodin [Hydrocodone-Acetaminophen ] Nausea And Vomiting   Review of Systems  Constitutional:  Negative for chills and fever.  HENT:  Negative for sore throat.   Respiratory:  Negative for cough and shortness of breath.   Cardiovascular:  Negative for chest pain, palpitations and leg swelling.  Gastrointestinal:  Negative for abdominal pain, blood in stool, constipation, diarrhea, nausea and vomiting.  Genitourinary:  Negative for dysuria and hematuria.  Musculoskeletal:  Negative for myalgias.  Skin:  Negative for itching and rash.  Neurological:  Negative for dizziness and headaches.  Psychiatric/Behavioral:  Negative for depression and  suicidal ideas.       Objective:     BP 118/75   Pulse 82   Ht 5\' 4"  (1.626 m)   Wt 180 lb 3.2 oz (81.7 kg)   SpO2 95%   BMI 30.93 kg/m  BP Readings from Last 3 Encounters:  08/17/23 118/75  05/07/23 130/80  11/03/22 121/75   Physical Exam Vitals reviewed.  Constitutional:      General: She is not in acute distress.    Appearance: Normal appearance. She is obese. She is not toxic-appearing.  HENT:     Head: Normocephalic and atraumatic.     Right Ear: External ear normal.     Left Ear: External ear normal.     Nose: Nose normal. No congestion or rhinorrhea.     Mouth/Throat:     Mouth: Mucous membranes are moist.     Pharynx: Oropharynx is clear. No oropharyngeal exudate or posterior oropharyngeal erythema.  Eyes:     General: No scleral icterus.    Extraocular Movements: Extraocular movements intact.     Conjunctiva/sclera: Conjunctivae normal.     Pupils: Pupils are equal, round, and reactive to light.  Cardiovascular:     Rate and Rhythm: Normal rate and regular rhythm.     Pulses: Normal pulses.     Heart sounds: Normal heart sounds. No murmur heard.    No friction rub. No gallop.  Pulmonary:     Effort: Pulmonary effort is normal.     Breath sounds: Normal breath sounds. No wheezing, rhonchi or rales.  Abdominal:     General: Abdomen is flat. Bowel sounds are normal. There is no distension.     Palpations: Abdomen is soft.     Tenderness: There is no abdominal tenderness.  Musculoskeletal:        General: No swelling. Normal range of motion.     Cervical back: Normal range of motion.     Right lower leg: No edema.     Left lower leg: No edema.  Lymphadenopathy:     Cervical: No cervical adenopathy.  Skin:    General: Skin is warm and dry.     Capillary Refill: Capillary refill takes less than 2 seconds.     Coloration: Skin is not jaundiced.  Neurological:     General: No focal deficit present.     Mental Status: She is alert and oriented to person,  place, and time.  Psychiatric:        Mood and Affect: Mood normal.  Behavior: Behavior normal.     Last CBC Lab Results  Component Value Date   WBC 5.7 05/07/2023   HGB 13.6 05/07/2023   HCT 40.7 05/07/2023   MCV 94 05/07/2023   MCH 31.5 05/07/2023   RDW 12.3 05/07/2023   PLT 240 05/07/2023   Last metabolic panel Lab Results  Component Value Date   GLUCOSE 85 05/07/2023   NA 142 05/07/2023   K 4.2 05/07/2023   CL 104 05/07/2023   CO2 22 05/07/2023   BUN 10 05/07/2023   CREATININE 0.61 05/07/2023   EGFR 97 05/07/2023   CALCIUM 9.1 05/07/2023   PROT 6.8 05/07/2023   ALBUMIN 4.4 05/07/2023   LABGLOB 2.4 05/07/2023   AGRATIO 2.0 02/10/2022   BILITOT 0.3 05/07/2023   ALKPHOS 113 05/07/2023   AST 22 05/07/2023   ALT 17 05/07/2023   ANIONGAP 7 02/13/2018   Last lipids Lab Results  Component Value Date   CHOL 187 05/07/2023   HDL 58 05/07/2023   LDLCALC 107 (H) 05/07/2023   TRIG 123 05/07/2023   CHOLHDL 3.2 05/07/2023   Last hemoglobin A1c Lab Results  Component Value Date   HGBA1C 5.8 (H) 05/07/2023   Last thyroid  functions Lab Results  Component Value Date   TSH 2.150 05/07/2023   T4TOTAL 7.6 06/18/2011   Last vitamin D  Lab Results  Component Value Date   VD25OH 27.0 (L) 05/07/2023   Last vitamin B12 and Folate Lab Results  Component Value Date   VITAMINB12 327 05/07/2023   FOLATE 8.2 05/07/2023   The 10-year ASCVD risk score (Arnett DK, et al., 2019) is: 7.1%    Assessment & Plan:   Problem List Items Addressed This Visit       Fever blister - Primary   History of recurring fever blisters.  Valtrex  refilled today.      Hypothyroidism due to Hashimoto's thyroiditis   She remains on levothyroxine  88 mcg daily.  Asymptomatic currently.  TSH WNL when updated in January.  Levothyroxine  refilled today.      Anxiety   Alprazolam  0.25 mg nightly as needed for anxiety relief remains effective.  PDMP reviewed and is appropriate.  Refill  provided today.      Obesity (BMI 30.0-34.9)   Presenting today for weight management follow-up.  Qsymia  was prescribed at her appointment 1/23.  Current weight 180 pounds.  BMI 30.9.  She has lost 10 pounds since her previous appointment and was congratulated on her progress.  Qsymia  refilled today.  Will continue at current dose.      Return in about 3 months (around 11/17/2023).   Tobi Fortes, MD

## 2023-08-17 NOTE — Addendum Note (Signed)
 Addended by: Quoc Tome E on: 08/17/2023 02:52 PM   Modules accepted: Level of Service

## 2023-08-19 ENCOUNTER — Encounter (HOSPITAL_COMMUNITY): Payer: Self-pay

## 2023-08-26 ENCOUNTER — Ambulatory Visit: Payer: Self-pay | Admitting: Internal Medicine

## 2023-08-26 ENCOUNTER — Encounter: Payer: Self-pay | Admitting: Internal Medicine

## 2023-08-26 ENCOUNTER — Ambulatory Visit (HOSPITAL_COMMUNITY)
Admission: RE | Admit: 2023-08-26 | Discharge: 2023-08-26 | Disposition: A | Discharge status: Home / Self Care | Source: Ambulatory Visit | Attending: Internal Medicine | Admitting: Internal Medicine

## 2023-08-26 DIAGNOSIS — Z0000 Encounter for general adult medical examination without abnormal findings: Secondary | ICD-10-CM

## 2023-08-26 DIAGNOSIS — Z1231 Encounter for screening mammogram for malignant neoplasm of breast: Secondary | ICD-10-CM | POA: Diagnosis not present

## 2023-08-26 DIAGNOSIS — M8589 Other specified disorders of bone density and structure, multiple sites: Secondary | ICD-10-CM | POA: Diagnosis not present

## 2023-08-26 DIAGNOSIS — Z780 Asymptomatic menopausal state: Secondary | ICD-10-CM | POA: Diagnosis not present

## 2023-10-12 ENCOUNTER — Ambulatory Visit: Admitting: Allergy & Immunology

## 2023-11-17 ENCOUNTER — Ambulatory Visit

## 2023-11-19 ENCOUNTER — Ambulatory Visit: Admitting: Allergy & Immunology

## 2023-11-20 ENCOUNTER — Encounter: Payer: Self-pay | Admitting: Allergy & Immunology

## 2023-11-20 ENCOUNTER — Ambulatory Visit: Admitting: Allergy & Immunology

## 2023-11-20 VITALS — BP 122/88 | HR 79 | Temp 98.0°F | Resp 12 | Ht 62.0 in | Wt 180.4 lb

## 2023-11-20 DIAGNOSIS — J310 Chronic rhinitis: Secondary | ICD-10-CM

## 2023-11-20 DIAGNOSIS — J4520 Mild intermittent asthma, uncomplicated: Secondary | ICD-10-CM

## 2023-11-20 MED ORDER — ALBUTEROL SULFATE HFA 108 (90 BASE) MCG/ACT IN AERS
2.0000 | INHALATION_SPRAY | RESPIRATORY_TRACT | 2 refills | Status: DC | PRN
Start: 2023-11-20 — End: 2024-02-23

## 2023-11-20 NOTE — Patient Instructions (Signed)
 1. Mild intermittent asthma, uncomplicated - Lung testing looked good, although there was a weird blip at the beginning of the blowing test. - It did not improve with the use of the albuterol  puffs. - I do want to see if the albuterol  will help when you are having the coughing episodes associated with the bronchitis.  - It would be good to know whether this helps at all during those times.  2. Chronic rhinitis - Because of insurance stipulations, we cannot do skin testing on the same day as your first visit. - We are all working to fight this, but for now we need to do two separate visits.  - We will know more after we do testing at the next visit.  - The skin testing visit can be squeezed in at your convenience.  - Then we can make a more full plan to address all of your symptoms. - Be sure to stop your antihistamines for 3 days before this appointment.   3. Return in about 1 week (around 11/27/2023) for ALLERGY TESTING (1-55). You can have the follow up appointment with Dr. Iva or a Nurse Practicioner (our Nurse Practitioners are excellent and always have Physician oversight!).    Please inform us  of any Emergency Department visits, hospitalizations, or changes in symptoms. Call us  before going to the ED for breathing or allergy symptoms since we might be able to fit you in for a sick visit. Feel free to contact us  anytime with any questions, problems, or concerns.  It was a pleasure to meet you today! Tell Marcos I said hello!   Websites that have reliable patient information: 1. American Academy of Asthma, Allergy, and Immunology: www.aaaai.org 2. Food Allergy Research and Education (FARE): foodallergy.org 3. Mothers of Asthmatics: http://www.asthmacommunitynetwork.org 4. American College of Allergy, Asthma, and Immunology: www.acaai.org      "Like" us  on Facebook and Instagram for our latest updates!      A healthy democracy works best when [employer] participate! Make  sure you are registered to vote! If you have moved or changed any of your contact information, you will need to get this updated before voting! Scan the QR codes below to learn more!

## 2023-11-20 NOTE — Progress Notes (Signed)
 NEW PATIENT  Date of Service/Encounter:  11/20/23  Consult requested by: No primary care provider on file.   Assessment:   Mild intermittent asthma, uncomplicated - mostly presenting with recurrent bronchitis   Chronic rhinitis - planning for skin testing at the next visit  Complicated past medical history, including anxiety, hypothyroidism, and obesity  Plan/Recommendations:   1. Mild intermittent asthma, uncomplicated - Lung testing looked good, although there was a weird blip at the beginning of the blowing test. - It did not improve with the use of the albuterol  puffs. - I do want to see if the albuterol  will help when you are having the coughing episodes associated with the bronchitis.  - It would be good to know whether this helps at all during those times.  2. Chronic rhinitis - Because of insurance stipulations, we cannot do skin testing on the same day as your first visit. - We are all working to fight this, but for now we need to do two separate visits.  - We will know more after we do testing at the next visit.  - The skin testing visit can be squeezed in at your convenience.  - Then we can make a more full plan to address all of your symptoms. - Be sure to stop your antihistamines for 3 days before this appointment.   3. Return in about 1 week (around 11/27/2023) for ALLERGY TESTING (1-55). You can have the follow up appointment with Dr. Iva or a Nurse Practicioner (our Nurse Practitioners are excellent and always have Physician oversight!).   This note in its entirety was forwarded to the Provider who requested this consultation.  Subjective:   Sandra Roy is a 68 y.o. female presenting today for evaluation of  Chief Complaint  Patient presents with   Allergic Rhinitis     Sneezing, drainage     Sandra Roy has a history of the following: Patient Active Problem List   Diagnosis Date Noted   Fever blister 08/17/2023   Allergic  rhinitis 11/03/2022   Increased urinary frequency 07/14/2022   Hyperlipidemia 01/08/2022   Need for influenza vaccination 01/08/2022   Obesity (BMI 30.0-34.9) 12/03/2021   Elevated blood pressure reading 12/03/2021   Preventative health care 12/03/2021   Superficial vein thrombosis 11/06/2020   Osteoarthritis of right knee 02/12/2018   S/P knee replacement 02/12/2018   History of colonic polyps 06/26/2014   Anxiety 06/26/2014   Dyspareunia 06/22/2013   Vaginal atrophy 06/22/2013   Factor 5 Leiden mutation, heterozygous (HCC) 06/21/2012   Osteopenia    Hypothyroidism due to Hashimoto's thyroiditis     History obtained from: chart review and patient.  Discussed the use of AI scribe software for clinical note transcription with the patient and/or guardian, who gave verbal consent to proceed.  Sandra Roy was referred by No primary care provider on file.SABRA Pen Boots is a 68 y.o. female presenting for an evaluation of recurrent bronchitis and postnasal drip/environmental allergies.   Asthma/Respiratory Symptom History: She has a history of bronchitis, which she used to get frequently when younger. If sinus infections are not addressed promptly, they tend to progress into bronchitis. She recalls having pneumonia as a baby. Antibiotics are used only when necessary for procedures due to her knee replacement. She experiences recurrent episodes of bronchitis, particularly during seasonal changes, characterized by a persistent cough. No fever or chest tightness is noted during these episodes. She has not previously used an inhaler for  these symptoms, and past use of albuterol  did not improve her cough.  She is exposed to secondhand smoke from her husband, who has COPD and smokes outside the house. She does not have a history of smoking herself and denies any coughing or wheezing unless she has bronchitis.  Allergic Rhinitis Symptom History: She experiences chronic nasal  drainage, described as a constant sensation of drainage down her throat. The drainage is slightly white and not clear. She has tried antibiotics in the past without significant relief. No frequent sinus infections are reported, and she has not been allergy tested before. She occasionally uses Zyrtec D for congestion, but finds regular Zyrtec and other antihistamines like Benadryl  ineffective. She has a history of sinus infections with an uncertain cause. Although she has not undergone allergy testing, she suspects ragweed might be a trigger, recalling a breakout during its prevalent season. She has a hypoallergenic dog at home, which does not bother her allergies.   She experiences anxiety. She also has a history of hypothyroidism as well as obesity.   She was born in [place]  and moved frequently due to her father's construction job, living in various states including [place] , [place] , and [place] . She experienced a significant cultural adjustment moving from [place]  to [place]  at age 15, during which she was responsible for her younger brother. Her mother was in [place]  for 10 months trying to sell the house. Richardson was expected to cook for the family and make sure that all of their needs were taken care of.   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 Her husband initially worked for [employer] before they started their own business. Her son now runs the business.   She is friends with Marcos Caul, who recommended that she come here for an evaluation.  Otherwise, there is no history of other atopic diseases, including drug allergies, stinging insect allergies, or contact dermatitis. There is no significant infectious history. Vaccinations are up to date.    Past Medical History: Patient Active Problem List   Diagnosis Date Noted   Fever blister 08/17/2023   Allergic rhinitis 11/03/2022   Increased urinary frequency 07/14/2022    Hyperlipidemia 01/08/2022   Need for influenza vaccination 01/08/2022   Obesity (BMI 30.0-34.9) 12/03/2021   Elevated blood pressure reading 12/03/2021   Preventative health care 12/03/2021   Superficial vein thrombosis 11/06/2020   Osteoarthritis of right knee 02/12/2018   S/P knee replacement 02/12/2018   History of colonic polyps 06/26/2014   Anxiety 06/26/2014   Dyspareunia 06/22/2013   Vaginal atrophy 06/22/2013   Factor 5 Leiden mutation, heterozygous (HCC) 06/21/2012   Osteopenia    Hypothyroidism due to Hashimoto's thyroiditis     Medication List:  Allergies as of 11/20/2023       Reactions   Adhesive [tape]    blisters   Aminoquinolines Other (See Comments)   Unknown   Azithromycin  Other (See Comments)   Unknown   Bactrim [sulfamethoxazole-trimethoprim] Other (See Comments)   Unknown   Clarithromycin Other (See Comments)   UTI   Naproxen Sodium Other (See Comments)   Acid reflux   Other Swelling   Monocryl stitch 5-0 Plain Catgut suture   Vicodin [hydrocodone-acetaminophen ] Nausea And Vomiting        Medication List        Accurate as of November 20, 2023 10:59 AM. If you have any questions, ask your nurse or doctor.  STOP taking these medications    Melatonin 5 MG Chew Stopped by: Sandra Roy   Qsymia  7.5-46 MG Cp24 Generic drug: Phentermine-Topiramate ER Stopped by: Sandra Roy       TAKE these medications    albuterol  108 (90 Base) MCG/ACT inhaler Commonly known as: VENTOLIN  HFA Inhale 2 puffs into the lungs every 4 (four) hours as needed. Started by: Sandra Roy   ALPRAZolam  0.25 MG tablet Commonly known as: XANAX  Take 1 tablet (0.25 mg total) by mouth at bedtime as needed for anxiety.   diclofenac  75 MG EC tablet Commonly known as: VOLTAREN  TAKE (1) TABLET BY MOUTH TWICE DAILY.   fluticasone  50 MCG/ACT nasal spray Commonly known as: FLONASE  Place 1 spray into both nostrils daily as needed for  allergies.   levothyroxine  88 MCG tablet Commonly known as: SYNTHROID  Take 1 tablet (88 mcg total) by mouth daily before breakfast.   MULTIVITAMIN PO Take 3 tablets by mouth daily.   omeprazole  40 MG capsule Commonly known as: PRILOSEC TAKE ONE CAPSULE BY MOUTH ONCE DAILY.   promethazine -dextromethorphan 6.25-15 MG/5ML syrup Commonly known as: PROMETHAZINE -DM Take 2.5 mLs by mouth 4 (four) times daily as needed for cough.   Vitamin D3 50 MCG (2000 UT) Chew Chew 4,000 Units by mouth daily.   ZYRTEC-D PO Take by mouth.        Birth History: non-contributory  Developmental History: non-contributory  Past Surgical History: Past Surgical History:  Procedure Laterality Date   ABDOMINAL HYSTERECTOMY  2003   TAH/BSO   ANKLE SURGERY Bilateral 09/18/2017   excision lipomas    BLEPHAROPLASTY Bilateral    CARPAL TUNNEL RELEASE Right 2003   COLONOSCOPY     DILATION AND CURETTAGE OF UTERUS     ENDOMETRIAL ABLATION     KNEE ARTHROSCOPY  2010   RIGHT   LIPOMA EXCISION Bilateral    2019   PELVIC LAPAROSCOPY     LSO   TOTAL KNEE ARTHROPLASTY Right 02/12/2018   Procedure: RIGHT TOTAL KNEE ARTHROPLASTY;  Surgeon: Gerome Charleston, MD;  Location: [facility];  Service: Orthopedics;  Laterality: Right;   TUBAL LIGATION  1976   UPPER GI ENDOSCOPY       Family History: Family History  Problem Relation Age of Onset   Allergic rhinitis Mother    Diabetes Father    Heart disease Father    Hypertension Sister    Cancer Sister        LUNG  (SMOKER)   Diabetes Brother    Hypertension Brother    Breast cancer Neg Hx      Social History: Cloee lives at home with her husband. She lives in a house that is 24 years old. There is wooden flooring the main living areas and carpeting in the bedroom. There is a heat pump for heating and cooling. There is some central cooling. She has a hypoallergenic dog in the home. There are no dust mite coverings in the bedding. There is tobacco exposure  inside of the home. Husband smokes both inside of the home and the car.    Review of systems otherwise negative other than that mentioned in the HPI.    Objective:   Blood pressure 122/88, pulse 79, temperature 98 F (36.7 C), resp. rate 12, height 5' 2 (1.575 m), weight 180 lb 6 oz (81.8 kg), SpO2 95%. Body mass index is 32.99 kg/m.     Physical Exam Vitals reviewed.  Constitutional:      Appearance: She is  well-developed.     Comments: Very talkative.   HENT:     Head: Normocephalic and atraumatic.     Right Ear: Tympanic membrane, ear canal and external ear normal. No drainage, swelling or tenderness. Tympanic membrane is not injected, scarred, erythematous, retracted or bulging.     Left Ear: Tympanic membrane, ear canal and external ear normal. No drainage, swelling or tenderness. Tympanic membrane is not injected, scarred, erythematous, retracted or bulging.     Nose: No nasal deformity, septal deviation, mucosal edema or rhinorrhea.     Right Turbinates: Enlarged, swollen and pale.     Left Turbinates: Enlarged, swollen and pale.     Right Sinus: No maxillary sinus tenderness or frontal sinus tenderness.     Left Sinus: No maxillary sinus tenderness or frontal sinus tenderness.     Mouth/Throat:     Mouth: Mucous membranes are not pale and not dry.     Pharynx: Uvula midline.  Eyes:     General:        Right eye: No discharge.        Left eye: No discharge.     Conjunctiva/sclera: Conjunctivae normal.     Right eye: Right conjunctiva is not injected. No chemosis.    Left eye: Left conjunctiva is not injected. No chemosis.    Pupils: Pupils are equal, round, and reactive to light.  Cardiovascular:     Rate and Rhythm: Normal rate and regular rhythm.     Heart sounds: Normal heart sounds.  Pulmonary:     Effort: Pulmonary effort is normal. No tachypnea, accessory muscle usage or respiratory distress.     Breath sounds: Normal breath sounds. No wheezing, rhonchi or  rales.  Chest:     Chest wall: No tenderness.  Abdominal:     Tenderness: There is no abdominal tenderness. There is no guarding or rebound.  Lymphadenopathy:     Head:     Right side of head: No submandibular, tonsillar or occipital adenopathy.     Left side of head: No submandibular, tonsillar or occipital adenopathy.     Cervical: No cervical adenopathy.  Skin:    Coloration: Skin is not pale.     Findings: No abrasion, erythema, petechiae or rash. Rash is not papular, urticarial or vesicular.  Neurological:     Mental Status: She is alert.  Psychiatric:        Behavior: Behavior is cooperative.      Diagnostic studies:    Spirometry: results normal (FEV1: 2.00/97%, FVC: 3.16/119%, FEV1/FVC: 63%).    Spirometry consistent with normal pattern. FVL appeared somewhat blunted at the beginning, so we did do 4 puffs of Xopenex. This did not change the appearance of the spiro at all, with the same chink seen at the beginning of the spirometry.   Allergy Studies: deferred due to insurance stipulations that require a separate visit for testing           Sandra Shaggy, MD [facility] 

## 2023-12-02 ENCOUNTER — Ambulatory Visit: Admitting: Allergy & Immunology

## 2023-12-02 DIAGNOSIS — J302 Other seasonal allergic rhinitis: Secondary | ICD-10-CM

## 2023-12-02 DIAGNOSIS — J3089 Other allergic rhinitis: Secondary | ICD-10-CM

## 2023-12-02 DIAGNOSIS — J4520 Mild intermittent asthma, uncomplicated: Secondary | ICD-10-CM

## 2023-12-02 NOTE — Patient Instructions (Addendum)
 1. Mild intermittent asthma, uncomplicated - Lung testing looked good, although there was a weird blip at the beginning of the blowing test (similar to the first time).  - I am going to talk to my colleagues about it, although it miight be due to technique.  - Continue with the albuterol  as needed for the coughing episodes associated with the bronchitis.  - It would be good to know whether this helps at all during those times.  2. Chronic rhinitis - Testing today showed: grasses, weeds, trees, indoor molds, outdoor molds, dust mites, cat, and dog - Copy of test results provided.  - Avoidance measures provided. - Stop taking: Flonase   - Continue with: Zyrtec-D (try to limit this since it can cause rebound congestion like Afrin) - Start taking: Xyzal (levocetirizine) 5mg  tablet once daily and Xhance  (fluticasone ) 1-2 sprays per nostril daily  (sample provided today) - You can use an extra dose of the antihistamine, if needed, for breakthrough symptoms.  - Consider nasal saline rinses 1-2 times daily to remove allergens from the nasal cavities as well as help with mucous clearance (this is especially helpful to do before the nasal sprays are given) - Consider allergy  shots as a means of long-term control. - Allergy  shots re-train and reset the immune system to ignore environmental allergens and decrease the resulting immune response to those allergens (sneezing, itchy watery eyes, runny nose, nasal congestion, etc).    - Allergy  shots improve symptoms in 75-85% of patients.  - We can discuss more at the next appointment if the medications are not working for you.  3. Return in about 4 weeks (around 12/30/2023). You can have the follow up appointment with Dr. Iva or a Nurse Practicioner (our Nurse Practitioners are excellent and always have Physician oversight!).    Please inform us  of any Emergency Department visits, hospitalizations, or changes in symptoms. Call us  before going to the  ED for breathing or allergy  symptoms since we might be able to fit you in for a sick visit. Feel free to contact us  anytime with any questions, problems, or concerns.  It was a pleasure to see you again today!   Websites that have reliable patient information: 1. American Academy of Asthma, Allergy , and Immunology: www.aaaai.org 2. Food Allergy  Research and Education (FARE): foodallergy.org 3. Mothers of Asthmatics: http://www.asthmacommunitynetwork.org 4. [employer] of Allergy , Asthma, and Immunology: www.acaai.org      "Like" us  on Facebook and Instagram for our latest updates!      A healthy democracy works best when [employer] participate! Make sure you are registered to vote! If you have moved or changed any of your contact information, you will need to get this updated before voting! Scan the QR codes below to learn more!       Airborne Adult Perc - 12/02/23 0950     Time Antigen Placed 0950    Allergen Manufacturer Jestine    Location Back    Number of Test 55    1. Control-Buffer 50% Glycerol Negative    2. Control-Histamine 2+    3. Bahia 2+    4. French [place] Negative    5. Johnson Negative    6. [place]  Blue 2+    7. Meadow Fescue Negative    8. Perennial Rye Negative    9. Timothy Negative    10. Ragweed Mix Negative    11. Cocklebur Negative    12. Plantain,  English Negative    13. Baccharis Negative  14. Dog Fennel 3+    15. Russian Thistle 3+    16. Lamb's Quarters Negative    17. Sheep Sorrell Negative    18. Rough Pigweed Negative    19. Marsh Elder, Rough Negative    20. Mugwort, Common Negative    21. Box, Elder Negative    22. Cedar, red 3+    23. Sweet Gum 2+    24. Pecan Pollen 2+    25. Pine Mix Negative    26. Walnut, Black Pollen Negative    27. Red Mulberry Negative    28. Ash Mix Negative    29. Birch Mix Negative    30. Beech American 2+    31. Cottonwood, [place] 2+    32. Hickory, White Negative    33. Maple Mix Negative     34. Oak, [place] Mix Negative    35. Sycamore Eastern Negative    36. Alternaria Alternata Negative    37. Cladosporium Herbarum 2+    38. Aspergillus Mix 3+    39. Penicillium Mix Negative    40. Bipolaris Sorokiniana (Helminthosporium) Negative    41. Drechslera Spicifera (Curvularia) Negative    42. Mucor Plumbeus Negative    43. Fusarium Moniliforme Negative    44. Aureobasidium Pullulans (pullulara) Negative    45. Rhizopus Oryzae Negative    46. Botrytis Cinera 2+    47. Epicoccum Nigrum Negative    48. Phoma Betae Negative    49. Dust Mite Mix Negative    50. Cat Hair 10,000 BAU/ml Negative    51.  Dog Epithelia Negative    52. Mixed Feathers Negative    53. Horse Epithelia Negative    54. Cockroach, German Negative    55. Tobacco Leaf Negative          Intradermal - 12/02/23 1002     Time Antigen Placed 1015    Allergen Manufacturer Jestine    Location Arm    Number of Test 10    Control Negative    [place] Negative    Johnson Negative    Ragweed Mix Negative    Mold 3 3+    Mold 4 3+    Mite Mix 2+    Cat 3+    Dog 3+    Cockroach Negative          Reducing Pollen Exposure  The American Academy of Allergy , Asthma and Immunology suggests the following steps to reduce your exposure to pollen during allergy  seasons.    Do not hang sheets or clothing out to dry; pollen may collect on these items. Do not mow lawns or spend time around freshly cut grass; mowing stirs up pollen. Keep windows closed at night.  Keep car windows closed while driving. Minimize morning activities outdoors, a time when pollen counts are usually at their highest. Stay indoors as much as possible when pollen counts or humidity is high and on windy days when pollen tends to remain in the air longer. Use air conditioning when possible.  Many air conditioners have filters that trap the pollen spores. Use a HEPA room air filter to remove pollen form the indoor air you  breathe.  Control of Mold Allergen   Mold and fungi can grow on a variety of surfaces provided certain temperature and moisture conditions exist.  Outdoor molds grow on plants, decaying vegetation and soil.  The major outdoor mold, Alternaria and Cladosporium, are found in very high numbers during hot and dry conditions.  Generally, a late Summer - Fall peak is seen for common outdoor fungal spores.  Rain will temporarily lower outdoor mold spore count, but counts rise rapidly when the rainy period ends.  The most important indoor molds are Aspergillus and Penicillium.  Dark, humid and poorly ventilated basements are ideal sites for mold growth.  The next most common sites of mold growth are the bathroom and the kitchen.  Outdoor (Seasonal) Mold Control   Use air conditioning and keep windows closed Avoid exposure to decaying vegetation. Avoid leaf raking. Avoid grain handling. Consider wearing a face mask if working in moldy areas.    Indoor (Perennial) Mold Control    Maintain humidity below 50%. Clean washable surfaces with 5% bleach solution. Remove sources e.g. contaminated carpets.    Control of Dust Mite Allergen    Dust mites play a major role in allergic asthma and rhinitis.  They occur in environments with high humidity wherever human skin is found.  Dust mites absorb humidity from the atmosphere (ie, they do not drink) and feed on organic matter (including shed human and animal skin).  Dust mites are a microscopic type of insect that you cannot see with the naked eye.  High levels of dust mites have been detected from mattresses, pillows, carpets, upholstered furniture, bed covers, clothes, soft toys and any woven material.  The principal allergen of the dust mite is found in its feces.  A gram of dust may contain 1,000 mites and 250,000 fecal particles.  Mite antigen is easily measured in the air during house cleaning activities.  Dust mites do not bite and do not cause harm  to humans, other than by triggering allergies/asthma.    Ways to decrease your exposure to dust mites in your home:  Encase mattresses, box springs and pillows with a mite-impermeable barrier or cover   Wash sheets, blankets and drapes weekly in hot water (130 F) with detergent and dry them in a dryer on the hot setting.  Have the room cleaned frequently with a vacuum cleaner and a damp dust-mop.  For carpeting or rugs, vacuuming with a vacuum cleaner equipped with a high-efficiency particulate air (HEPA) filter.  The dust mite allergic individual should not be in a room which is being cleaned and should wait 1 hour after cleaning before going into the room. Do not sleep on upholstered furniture (eg, couches).   If possible removing carpeting, upholstered furniture and drapery from the home is ideal.  Horizontal blinds should be eliminated in the rooms where the person spends the most time (bedroom, study, television room).  Washable vinyl, roller-type shades are optimal. Remove all non-washable stuffed toys from the bedroom.  Wash stuffed toys weekly like sheets and blankets above.   Reduce indoor humidity to less than 50%.  Inexpensive humidity monitors can be purchased at most hardware stores.  Do not use a humidifier as can make the problem worse and are not recommended.  Control of Dog or Cat Allergen  Avoidance is the best way to manage a dog or cat allergy . If you have a dog or cat and are allergic to dog or cats, consider removing the dog or cat from the home. If you have a dog or cat but don't want to find it a new home, or if your family wants a pet even though someone in the household is allergic, here are some strategies that may help keep symptoms at bay:  Keep the pet out of your bedroom and restrict  it to only a few rooms. Be advised that keeping the dog or cat in only one room will not limit the allergens to that room. Don't pet, hug or kiss the dog or cat; if you do, wash your  hands with soap and water. High-efficiency particulate air (HEPA) cleaners run continuously in a bedroom or living room can reduce allergen levels over time. Regular use of a high-efficiency vacuum cleaner or a central vacuum can reduce allergen levels. Giving your dog or cat a bath at least once a week can reduce airborne allergen.  Allergy  Shots  Allergies are the result of a chain reaction that starts in the immune system. Your immune system controls how your body defends itself. For instance, if you have an allergy  to pollen, your immune system identifies pollen as an invader or allergen. Your immune system overreacts by producing antibodies called Immunoglobulin E (IgE). These antibodies travel to cells that release chemicals, causing an allergic reaction.  The concept behind allergy  immunotherapy, whether it is received in the form of shots or tablets, is that the immune system can be desensitized to specific allergens that trigger allergy  symptoms. Although it requires time and patience, the payback can be long-term relief. Allergy  injections contain a dilute solution of those substances that you are allergic to based upon your skin testing and allergy  history.   How Do Allergy  Shots Work?  Allergy  shots work much like a vaccine. Your body responds to injected amounts of a particular allergen given in increasing doses, eventually developing a resistance and tolerance to it. Allergy  shots can lead to decreased, minimal or no allergy  symptoms.  There generally are two phases: build-up and maintenance. Build-up often ranges from three to six months and involves receiving injections with increasing amounts of the allergens. The shots are typically given once or twice a week, though more rapid build-up schedules are sometimes used.  The maintenance phase begins when the most effective dose is reached. This dose is different for each person, depending on how allergic you are and your response to  the build-up injections. Once the maintenance dose is reached, there are longer periods between injections, typically two to four weeks.  Occasionally doctors give cortisone-type shots that can temporarily reduce allergy  symptoms. These types of shots are different and should not be confused with allergy  immunotherapy shots.  Who Can Be Treated with Allergy  Shots?  Allergy  shots may be a good treatment approach for people with allergic rhinitis (hay fever), allergic asthma, conjunctivitis (eye allergy ) or stinging insect allergy .   Before deciding to begin allergy  shots, you should consider:   The length of allergy  season and the severity of your symptoms  Whether medications and/or changes to your environment can control your symptoms  Your desire to avoid long-term medication use  Time: allergy  immunotherapy requires a major time commitment  Cost: may vary depending on your insurance coverage  Allergy  shots for children age 41 and older are effective and often well tolerated. They might prevent the onset of new allergen sensitivities or the progression to asthma.  Allergy  shots are not started on patients who are pregnant but can be continued on patients who become pregnant while receiving them. In some patients with other medical conditions or who take certain common medications, allergy  shots may be of risk. It is important to mention other medications you talk to your allergist.   What are the two types of build-ups offered:   RUSH or Rapid Desensitization -- one day of injections  lasting from 8:30-4:30pm, injections every 1 hour.  Approximately half of the build-up process is completed in that one day.  The following week, normal build-up is resumed, and this entails ~16 visits either weekly or twice weekly, until reaching your "maintenance dose" which is continued weekly until eventually getting spaced out to every month for a duration of 3 to 5 years. The regular build-up  appointments are nurse visits where the injections are administered, followed by required monitoring for 30 minutes.    Traditional build-up -- weekly visits for 6 -12 months until reaching "maintenance dose", then continue weekly until eventually spacing out to every 4 weeks as above. At these appointments, the injections are administered, followed by required monitoring for 30 minutes.     Either way is acceptable, and both are equally effective. With the rush protocol, the advantage is that less time is spent here for injections overall AND you would also reach maintenance dosing faster (which is when the clinical benefit starts to become more apparent). Not everyone is a candidate for rapid desensitization.   IF we proceed with the RUSH protocol, there are premedications which must be taken the day before and the day after the rush only (this includes antihistamines, steroids, and Singulair).  After the rush day, no prednisone  or Singulair is required, and we just recommend antihistamines taken on your injection day.  What Is An Estimate of the Costs?  If you are interested in starting allergy  injections, please check with your insurance company about your coverage for both allergy  vial sets and allergy  injections.  Please do so prior to making the appointment to start injections.  The following are CPT codes to give to your insurance company. These are the amounts we BILL to the insurance company, but the amount YOU WILL PAY and WE RECEIVE IS SUBSTANTIALLY LESS and depends on the contracts we have with different insurance companies.   Amount Billed to Insurance One allergy  vial set  CPT 95165   $ 1200     Two allergy  vial set  CPT 95165   $ 2400     Three allergy  vial set  CPT 95165   $ 3600     One injection   CPT 95115   $ 35  Two injections   CPT 95117   $ 40 RUSH (Rapid Desensitization) CPT 95180 x 8 hours $500/hour  Regarding the allergy  injections, your co-pay may or may not apply  with each injection, so please confirm this with your insurance company. When you start allergy  injections, 1 or 2 sets of vials are made based on your allergies.  Not all patients can be on one set of vials. A set of vials lasts 6 months to a year depending on how quickly you can proceed with your build-up of your allergy  injections. Vials are personalized for each patient depending on their specific allergens.  How often are allergy  injection given during the build-up period?   Injections are given at least weekly during the build-up period until your maintenance dose is achieved. Per the doctor's discretion, you may have the option of getting allergy  injections two times per week during the build-up period. However, there must be at least 48 hours between injections. The build-up period is usually completed within 6-12 months depending on your ability to schedule injections and for adjustments for reactions. When maintenance dose is reached, your injection schedule is gradually changed to every two weeks and later to every three weeks. Injections will then continue  every 4 weeks. Usually, injections are continued for a total of 3-5 years.   When Will I Feel Better?  Some may experience decreased allergy  symptoms during the build-up phase. For others, it may take as long as 12 months on the maintenance dose. If there is no improvement after a year of maintenance, your allergist will discuss other treatment options with you.  If you aren't responding to allergy  shots, it may be because there is not enough dose of the allergen in your vaccine or there are missing allergens that were not identified during your allergy  testing. Other reasons could be that there are high levels of the allergen in your environment or major exposure to non-allergic triggers like tobacco smoke.  What Is the Length of Treatment?  Once the maintenance dose is reached, allergy  shots are generally continued for three to five  years. The decision to stop should be discussed with your allergist at that time. Some people may experience a permanent reduction of allergy  symptoms. Others may relapse and a longer course of allergy  shots can be considered.  What Are the Possible Reactions?  The two types of adverse reactions that can occur with allergy  shots are local and systemic. Common local reactions include very mild redness and swelling at the injection site, which can happen immediately or several hours after. Report a delayed reaction from your last injection. These include arm swelling or runny nose, watery eyes or cough that occurs within 12-24 hours after injection. A systemic reaction, which is less common, affects the entire body or a particular body system. They are usually mild and typically respond quickly to medications. Signs include increased allergy  symptoms such as sneezing, a stuffy nose or hives.   Rarely, a serious systemic reaction called anaphylaxis can develop. Symptoms include swelling in the throat, wheezing, a feeling of tightness in the chest, nausea or dizziness. Most serious systemic reactions develop within 30 minutes of allergy  shots. This is why it is strongly recommended you wait in your doctor's office for 30 minutes after your injections. Your allergist is trained to watch for reactions, and his or her staff is trained and equipped with the proper medications to identify and treat them.   Report to the nurse immediately if you experience any of the following symptoms: swelling, itching or redness of the skin, hives, watery eyes/nose, breathing difficulty, excessive sneezing, coughing, stomach pain, diarrhea, or light headedness. These symptoms may occur within 15-20 minutes after injection and may require medication.   Who Should Administer Allergy  Shots?  The preferred location for receiving shots is your prescribing allergist's office. Injections can sometimes be given at another facility  where the physician and staff are trained to recognize and treat reactions, and have received instructions by your prescribing allergist.  What if I am late for an injection?   Injection dose will be adjusted depending upon how many days or weeks you are late for your injection.   What if I am sick?   Please report any illness to the nurse before receiving injections. She may adjust your dose or postpone injections depending on your symptoms. If you have fever, flu, sinus infection or chest congestion it is best to postpone allergy  injections until you are better. Never get an allergy  injection if your asthma is causing you problems. If your symptoms persist, seek out medical care to get your health problem under control.  What If I am or Become Pregnant:  Women that become pregnant should schedule an appointment  with [facility] before receiving any further allergy  injections.

## 2023-12-02 NOTE — Progress Notes (Unsigned)
   FOLLOW UP  Date of Service/Encounter:  12/02/23   Assessment:   Mild intermittent asthma, uncomplicated - mostly presenting with recurrent bronchitis    Chronic rhinitis - planning for skin testing at the next visit   Complicated past medical history, including anxiety, hypothyroidism, and obesity  Plan/Recommendations:   There are no Patient Instructions on file for this visit.   Subjective:   Sandra Roy is a 68 y.o. 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  Discussed the use of AI scribe software for clinical note transcription with the patient and/or guardian, who gave verbal consent to proceed.  [name] is a 68 y.o. female presenting for skin testing. She was last seen on August 8th. We could not do testing because her insurance company does not cover testing on the same day as a New Patient visit. She has been off of all antihistamines 3 days in anticipation of the testing.   At that visit, lung testing looked good. She had a blip on her spiro that  did not improve with the albuterol  treatment. We added on albuterl to see if that helped at all. For her rhinitis, we decided to do skin testing.   Otherwise, there have been no changes to her past medical history, surgical history, family history, or social history.    Review of systems otherwise negative other than that mentioned in the HPI.    Objective:   There were no vitals taken for this visit. There is no height or weight on file to calculate BMI.    Physical exam deferred since this was a skin testing appointment only.   Diagnostic studies:    Spirometry: results normal (FEV1: 1.70/82%, FVC: 3.02/114%, FEV1/FVC: 56%).    Spirometry consistent with normal pattern. {Blank single:19197::Albuterol /Atrovent nebulizer,Xopenex/Atrovent nebulizer,Albuterol  nebulizer,Albuterol  four puffs via MDI,Xopenex four puffs via MDI} treatment given in clinic with {Blank single:19197::significant improvement in FEV1 per ATS criteria,significant improvement in FVC per ATS criteria,significant improvement in FEV1 and FVC per ATS criteria,improvement in FEV1, but not significant per ATS criteria,improvement in FVC, but not significant per ATS criteria,improvement in FEV1 and FVC, but not significant per ATS criteria,no improvement}. She still has the blip at the beginning of the expiratory phase.   Allergy  Studies: {Blank single:19197::none,labs sent instead, }    {Blank single:19197::Allergy  testing results were read and interpreted by myself, documented by clinical staff., }      Marty Shaggy, MD  [facility]

## 2023-12-04 ENCOUNTER — Encounter: Payer: Self-pay | Admitting: Allergy & Immunology

## 2023-12-25 ENCOUNTER — Other Ambulatory Visit: Payer: Self-pay

## 2023-12-25 MED ORDER — FLUTICASONE PROPIONATE 50 MCG/ACT NA SUSP: 1.0000 | Freq: Every day | NASAL | 11 refills | Status: AC | PRN

## 2023-12-31 ENCOUNTER — Other Ambulatory Visit: Payer: Self-pay

## 2023-12-31 DIAGNOSIS — F419 Anxiety disorder, unspecified: Secondary | ICD-10-CM

## 2023-12-31 MED ORDER — ALPRAZOLAM 0.25 MG PO TABS: 0.2500 mg | ORAL_TABLET | Freq: Every evening | ORAL | 0 refills | Status: DC | PRN

## 2024-01-05 ENCOUNTER — Other Ambulatory Visit: Payer: Self-pay

## 2024-01-05 DIAGNOSIS — K219 Gastro-esophageal reflux disease without esophagitis: Secondary | ICD-10-CM

## 2024-01-05 MED ORDER — OMEPRAZOLE 40 MG PO CPDR
40.0000 mg | DELAYED_RELEASE_CAPSULE | Freq: Every day | ORAL | 3 refills | Status: AC
Start: 2024-01-05 — End: ?

## 2024-01-13 ENCOUNTER — Encounter: Payer: Self-pay | Admitting: Allergy & Immunology

## 2024-01-13 ENCOUNTER — Other Ambulatory Visit: Payer: Self-pay

## 2024-01-13 ENCOUNTER — Ambulatory Visit: Admitting: Allergy & Immunology

## 2024-01-13 VITALS — BP 130/72 | HR 83 | Temp 97.5°F | Resp 18 | Ht 61.81 in | Wt 184.2 lb

## 2024-01-13 DIAGNOSIS — J4520 Mild intermittent asthma, uncomplicated: Secondary | ICD-10-CM

## 2024-01-13 DIAGNOSIS — J3089 Other allergic rhinitis: Secondary | ICD-10-CM

## 2024-01-13 DIAGNOSIS — J302 Other seasonal allergic rhinitis: Secondary | ICD-10-CM | POA: Diagnosis not present

## 2024-01-13 NOTE — Progress Notes (Unsigned)
 FOLLOW UP  Date of Service/Encounter:  01/13/24   Assessment:   Mild intermittent asthma, uncomplicated - mostly presenting with recurrent bronchitis   Abnormal spirometry - consider chest CT at the next visit   Perennial and seasonal allergic rhinitis (grasses, weeds, trees, indoor molds, outdoor molds, dust mites, cat, and dog)   Complicated past medical history, including anxiety, hypothyroidism, and obesity  Plan/Recommendations:   1. Mild intermittent asthma, uncomplicated - Lung testing still looked a bit weird, but the second time was better than the first!  - Continue with the albuterol  as needed for the coughing episodes associated with the bronchitis.  - It would be good to know whether this helps at all during those times. - Call us  when you have bronchitis (we have sick visits available!).   2. Chronic rhinitis - Testing in the past showed: grasses, weeds, trees, indoor molds, outdoor molds, dust mites, cat, and dog - Continue with: Zyrtec-D (try to limit this since it can cause rebound congestion like Afrin) - Continue with: Xyzal (levocetirizine) 5mg  tablet once daily and Xhance  (fluticasone ) 1-2 sprays per nostril daily  (sample provided today) - You can use an extra dose of the antihistamine, if needed, for breakthrough symptoms.  - Consider nasal saline rinses 1-2 times daily to remove allergens from the nasal cavities as well as help with mucous clearance (this is especially helpful to do before the nasal sprays are given).  3. Return in about 6 months (around 07/13/2024). You can have the follow up appointment with Dr. Iva or a Nurse Practicioner (our Nurse Practitioners are excellent and always have Physician oversight!).    Subjective:   Sandra Roy is a 68 y.o. female presenting today for follow up of No chief complaint on file.   Sandra Roy has a history of the following: Patient Active Problem List   Diagnosis Date Noted    Fever blister 08/17/2023   Allergic rhinitis 11/03/2022   Increased urinary frequency 07/14/2022   Hyperlipidemia 01/08/2022   Need for influenza vaccination 01/08/2022   Obesity (BMI 30.0-34.9) 12/03/2021   Elevated blood pressure reading 12/03/2021   Preventative health care 12/03/2021   Superficial vein thrombosis 11/06/2020   Osteoarthritis of right knee 02/12/2018   S/P knee replacement 02/12/2018   History of colonic polyps 06/26/2014   Anxiety 06/26/2014   Dyspareunia 06/22/2013   Vaginal atrophy 06/22/2013   Factor 5 Leiden mutation, heterozygous 06/21/2012   Osteopenia    Hypothyroidism due to Hashimoto's thyroiditis     History obtained from: chart review and patient.  Discussed the use of AI scribe software for clinical note transcription with the patient and/or guardian, who gave verbal consent to proceed.  Sandra Roy is a 68 y.o. female presenting for a follow up visit.  She was last seen in August 2020.  At that time, like testing looked good.  We continue with albuterol  as needed.  She underwent allergy  testing that was positive for multiple indoor and outdoor molds.  We stopped the Flonase  and continue with Zyrtec.  We started her on Xyzal 5 mg daily on Xhance .  Since last visit, she has done relatively well.   Asthma/Respiratory Symptom History: She is concerned about using albuterol  due to potential allergies and has not used it because she was alone and wanted someone present. Despite this, she has not had any recent bronchitis episodes, which she previously experienced. Her spirometry still looks a bit odd. She is worried about a recent breathing test where  her results decreased from eighty percent to forty percent, despite her efforts to perform the test correctly. No current chest tightness and she is able to converse in full sentences. Her highest episodes of breathing issues occur mostly in the winter. However she has not been having any breathing difficulties at all.    Allergic Rhinitis Symptom History: Her environmental allergies are well-controlled with Xyzal, which has alleviated symptoms of nasal congestion and drainage. She no longer wakes up with a stuffed nose and has not experienced any bronchitis episodes since her last visit. Her allergies are typically worse in the spring when pollen levels are high. She was previously using Flonase  but has not yet tried the new Xhance  nasal spray. The Xyzal has been very helpful for her and she feels that this is doing a good job to control her symptoms. She has not been on antibiotics and has not had any sinus or ear infections since the last time that we saw her.   Otherwise, there have been no changes to her past medical history, surgical history, family history, or social history.    Review of systems otherwise negative other than that mentioned in the HPI.    Objective:   Blood pressure 130/72, pulse 83, temperature (!) 97.5 F (36.4 C), temperature source Temporal, resp. rate 18, height 5' 1.81 (1.57 m), weight 184 lb 3.2 oz (83.6 kg), SpO2 95%. Body mass index is 33.9 kg/m.    Physical Exam Vitals reviewed.  Constitutional:      Appearance: She is well-developed.     Comments: Very talkative.   HENT:     Head: Normocephalic and atraumatic.     Right Ear: Tympanic membrane, ear canal and external ear normal. No drainage, swelling or tenderness. Tympanic membrane is not injected, scarred, erythematous, retracted or bulging.     Left Ear: Tympanic membrane, ear canal and external ear normal. No drainage, swelling or tenderness. Tympanic membrane is not injected, scarred, erythematous, retracted or bulging.     Nose: No nasal deformity, septal deviation, mucosal edema or rhinorrhea.     Right Turbinates: Enlarged, swollen and pale.     Left Turbinates: Enlarged, swollen and pale.     Right Sinus: No maxillary sinus tenderness or frontal sinus tenderness.     Left Sinus: No maxillary sinus  tenderness or frontal sinus tenderness.     Mouth/Throat:     Mouth: Mucous membranes are not pale and not dry.     Pharynx: Uvula midline.  Eyes:     General:        Right eye: No discharge.        Left eye: No discharge.     Conjunctiva/sclera: Conjunctivae normal.     Right eye: Right conjunctiva is not injected. No chemosis.    Left eye: Left conjunctiva is not injected. No chemosis.    Pupils: Pupils are equal, round, and reactive to light.  Cardiovascular:     Rate and Rhythm: Normal rate and regular rhythm.     Heart sounds: Normal heart sounds.  Pulmonary:     Effort: Pulmonary effort is normal. No tachypnea, accessory muscle usage or respiratory distress.     Breath sounds: Normal breath sounds. No wheezing, rhonchi or rales.  Chest:     Chest wall: No tenderness.  Lymphadenopathy:     Head:     Right side of head: No submandibular, tonsillar or occipital adenopathy.     Left side of head: No submandibular, tonsillar or  occipital adenopathy.     Cervical: No cervical adenopathy.  Skin:    Coloration: Skin is not pale.     Findings: No abrasion, erythema, petechiae or rash. Rash is not papular, urticarial or vesicular.  Neurological:     Mental Status: She is alert.  Psychiatric:        Behavior: Behavior is cooperative.      Diagnostic studies:    Spirometry: results abnormal (FEV1: 0.84/41%, FVC: 3.00/113%, FEV1/FVC: 28%).    Spirometry consistent with moderate obstructive disease.   We did a second 1 that demonstrated an FEV1 of 1.40 which is 68% predicted.  This was a little bit better than last time. Again she is not symptomatic.   Allergy  Studies: none       Marty Shaggy, MD  [facility] 

## 2024-01-13 NOTE — Patient Instructions (Addendum)
 1. Mild intermittent asthma, uncomplicated - Lung testing still looked a bit weird, but the second time was better than the first!  - Continue with the albuterol  as needed for the coughing episodes associated with the bronchitis.  - It would be good to know whether this helps at all during those times. - Call us  when you have bronchitis (we have sick visits available!).   2. Chronic rhinitis - Testing in the past showed: grasses, weeds, trees, indoor molds, outdoor molds, dust mites, cat, and dog - Continue with: Zyrtec-D (try to limit this since it can cause rebound congestion like Afrin) - Continue with: Xyzal (levocetirizine) 5mg  tablet once daily and Xhance  (fluticasone ) 1-2 sprays per nostril daily  (sample provided today) - You can use an extra dose of the antihistamine, if needed, for breakthrough symptoms.  - Consider nasal saline rinses 1-2 times daily to remove allergens from the nasal cavities as well as help with mucous clearance (this is especially helpful to do before the nasal sprays are given).  3. Return in about 6 months (around 07/13/2024). You can have the follow up appointment with Dr. Iva or a Nurse Practicioner (our Nurse Practitioners are excellent and always have Physician oversight!).    Please inform us  of any Emergency Department visits, hospitalizations, or changes in symptoms. Call us  before going to the ED for breathing or allergy  symptoms since we might be able to fit you in for a sick visit. Feel free to contact us  anytime with any questions, problems, or concerns.  It was a pleasure to see you again today!   Websites that have reliable patient information: 1. American Academy of Asthma, Allergy , and Immunology: www.aaaai.org 2. Food Allergy  Research and Education (FARE): foodallergy.org 3. Mothers of Asthmatics: http://www.asthmacommunitynetwork.org 4. American College of Allergy , Asthma, and Immunology: www.acaai.org      "Like" us  on Facebook and  Instagram for our latest updates!      A healthy democracy works best when [employer] participate! Make sure you are registered to vote! If you have moved or changed any of your contact information, you will need to get this updated before voting! Scan the QR codes below to learn more!

## 2024-01-15 ENCOUNTER — Encounter: Payer: Self-pay | Admitting: Allergy & Immunology

## 2024-02-01 DIAGNOSIS — Z96651 Presence of right artificial knee joint: Secondary | ICD-10-CM | POA: Diagnosis not present

## 2024-02-23 ENCOUNTER — Ambulatory Visit: Payer: Self-pay

## 2024-02-23 VITALS — BP 156/98 | HR 69 | Ht 60.0 in | Wt 188.0 lb

## 2024-02-23 DIAGNOSIS — B370 Candidal stomatitis: Secondary | ICD-10-CM

## 2024-02-23 DIAGNOSIS — E063 Autoimmune thyroiditis: Secondary | ICD-10-CM | POA: Diagnosis not present

## 2024-02-23 DIAGNOSIS — I10 Essential (primary) hypertension: Secondary | ICD-10-CM

## 2024-02-23 DIAGNOSIS — E782 Mixed hyperlipidemia: Secondary | ICD-10-CM

## 2024-02-23 DIAGNOSIS — F419 Anxiety disorder, unspecified: Secondary | ICD-10-CM

## 2024-02-23 DIAGNOSIS — E66811 Obesity, class 1: Secondary | ICD-10-CM

## 2024-02-23 MED ORDER — LISINOPRIL 10 MG PO TABS: 10.0000 mg | ORAL_TABLET | Freq: Every day | ORAL | 3 refills | Status: AC

## 2024-02-23 MED ORDER — ALPRAZOLAM 0.25 MG PO TABS: 0.2500 mg | ORAL_TABLET | Freq: Two times a day (BID) | ORAL | 0 refills | Status: DC | PRN

## 2024-02-23 MED ORDER — NYSTATIN 100000 UNIT/ML MT SUSP: 5.0000 mL | Freq: Three times a day (TID) | OROMUCOSAL | 2 refills | Status: AC

## 2024-02-23 NOTE — Telephone Encounter (Signed)
 FYI Only or Action Required?: Action required by provider: clinical question for provider.  Patient was last seen in primary care on 02/23/2024 by [name], FNP.  Called Nurse Triage reporting PCP Call .  Triage Disposition: Call PCP Within 24 Hours  Patient/caregiver understands and will follow disposition?: Yes  **See note below**     Copied from CRM [id number]. Topic: Clinical - Medical Advice >> Feb 23, 2024 10:13 AM Sandra Roy wrote: Reason for CRM: patient called about thrush she has on her tongue and Dr Bevely was gonna tell her what to do about it when she saw it but she didn't get that info before she left Reason for Disposition  [1] Caller requests to speak ONLY to PCP AND [2] NON-URGENT question  Answer Assessment - Initial Assessment Questions 1. REASON FOR CALL or QUESTION: What is your reason for calling today? or How can I best    Patient was seen in office today 11/11 and was evaluated by PCP for thrush on the patients tongue. Patient stated provider looked at the area, but did not advise on next steps/treatment plan. Please advise.  Protocols used: PCP Call - No Triage-A-[facility]

## 2024-02-23 NOTE — Telephone Encounter (Signed)
 Pt advised left Detailed VM as on DPR

## 2024-02-23 NOTE — Telephone Encounter (Signed)
 I do not recall her mentioning this during her visit today, but I sent in a mouthwash for her to use.

## 2024-02-23 NOTE — Progress Notes (Unsigned)
 Established Patient Office Visit  Subjective   Patient ID: Sandra Roy, female    DOB: [birth year]  Age: 68 y.o. MRN: [id number]  Chief Complaint  Patient presents with   Medical Management of Chronic Issues    TOC    HPI Discussed the use of AI scribe software for clinical note transcription with the patient, who gave verbal consent to proceed.  History of Present Illness    Sandra Roy is a 68 year old female who presents with elevated blood pressure and anxiety.  Hypertension - Elevated blood pressure measured at 156/98 mmHg - Questions accuracy of recent blood pressure reading, attributing it to the machine used - History of elevated blood pressure, never treated with antihypertensive medication  Anxiety - Increased anxiety related to husband's health and behavior, including sleepwalking and potential heart issues - Current alprazolam  (Xanax ) dose reduced to 0.25 mg, infrequently used - Anxiety exacerbated by husband's COPD, smoking habits, and impact on family dynamics  History of venous thromboembolism - History of blood clots in leg during previous COVID-19 infection - Describes sensation of clot moving up arm - Required 75 injections for treatment  Weight gain and metabolic concerns - Concerned about weight gain and previous metabolic dysfunction - Previously received medication to increase metabolism, possibly related to thyroid  function - Thyroid  levels have not been checked recently     Patient Active Problem List   Diagnosis Date Noted   Primary hypertension 02/25/2024   Fever blister 08/17/2023   Allergic rhinitis 11/03/2022   Increased urinary frequency 07/14/2022   Hyperlipidemia 01/08/2022   Need for influenza vaccination 01/08/2022   Obesity (BMI 30.0-34.9) 12/03/2021   Elevated blood pressure reading 12/03/2021   Preventative health care 12/03/2021   Superficial vein thrombosis 11/06/2020   Osteoarthritis of right knee  02/12/2018   S/P knee replacement 02/12/2018   History of colonic polyps 06/26/2014   Anxiety 06/26/2014   Dyspareunia 06/22/2013   Vaginal atrophy 06/22/2013   Factor 5 Leiden mutation, heterozygous 06/21/2012   Osteopenia    Hypothyroidism due to Hashimoto's thyroiditis       ROS    Objective:     BP (!) 156/98   Pulse 69   Ht 5' (1.524 m)   Wt 188 lb (85.3 kg)   SpO2 94%   BMI 36.72 kg/m  BP Readings from Last 3 Encounters:  02/23/24 (!) 156/98  01/13/24 130/72  11/20/23 122/88   Wt Readings from Last 3 Encounters:  02/23/24 188 lb (85.3 kg)  01/13/24 184 lb 3.2 oz (83.6 kg)  11/20/23 180 lb 6 oz (81.8 kg)      Physical Exam Vitals and nursing note reviewed.  Constitutional:      Appearance: Normal appearance. She is obese.  HENT:     Head: Normocephalic.  Eyes:     Extraocular Movements: Extraocular movements intact.     Pupils: Pupils are equal, round, and reactive to light.  Cardiovascular:     Rate and Rhythm: Normal rate and regular rhythm.  Pulmonary:     Effort: Pulmonary effort is normal.     Breath sounds: Normal breath sounds.  Musculoskeletal:     Cervical back: Normal range of motion and neck supple.  Neurological:     Mental Status: She is alert and oriented to person, place, and time.  Psychiatric:        Mood and Affect: Mood normal.        Thought Content: Thought content normal.  The 10-year ASCVD risk score (Arnett DK, et al., 2019) is: 17.2%    Assessment & Plan:   Problem List Items Addressed This Visit       Cardiovascular and Mediastinum   Primary hypertension   Blood pressure elevated at 170/97 mmHg, likely stress-related. No prior antihypertensive use. - Prescribed low dose antihypertensive medication. - Provided blood pressure targets (120/80 mmHg, acceptable up to 130/80 mmHg). - Advised home blood pressure monitoring.      Relevant Medications   lisinopril (ZESTRIL) 10 MG tablet     Endocrine    Hypothyroidism due to Hashimoto's thyroiditis - Primary   Thyroid  function not checked since last visit. Possible weight management issues related to thyroid  function. - Ordered thyroid  function tests. - Will adjust levothyroxine  dose based on lab results.      Relevant Orders   TSH + free T4 (Completed)     Other   Anxiety   Anxiety exacerbated by stress related to husband's health. Currently on alprazolam  0.25 mg. - Refilled alprazolam  0.25 mg prescription.      Relevant Medications   ALPRAZolam  (XANAX ) 0.25 MG tablet   Obesity (BMI 30.0-34.9)   Relevant Orders   CMP14+EGFR (Completed)   Lipid panel (Completed)   Hemoglobin A1c (Completed)   TSH + free T4 (Completed)   VITAMIN D  25 Hydroxy (Vit-D Deficiency, Fractures) (Completed)   Hyperlipidemia   -Repeat lipid panel ordered today      Relevant Medications   lisinopril (ZESTRIL) 10 MG tablet   Other Relevant Orders   Lipid panel (Completed)   Other Visit Diagnoses       Oral thrush       Relevant Medications   nystatin (MYCOSTATIN) 100000 UNIT/ML suspension       Return in about 2 months (around 04/24/2024) for chronic follow-up with PCP.    Leita Longs, FNP

## 2024-02-24 LAB — LIPID PANEL
Chol/HDL Ratio: 2.8 ratio (ref 0.0–4.4)
Cholesterol, Total: 189 mg/dL (ref 100–199)
HDL: 68 mg/dL (ref >39)
LDL Chol Calc (NIH): 102 mg/dL — ABNORMAL HIGH (ref 0–99)
Triglycerides: 106 mg/dL (ref 0–149)
VLDL Cholesterol Cal: 19 mg/dL (ref 5–40)

## 2024-02-24 LAB — CMP14+EGFR
ALT: 16 IU/L (ref 0–32)
AST: 19 IU/L (ref 0–40)
Albumin: 4.6 g/dL (ref 3.9–4.9)
Alkaline Phosphatase: 114 IU/L (ref 49–135)
BUN/Creatinine Ratio: 13 (ref 12–28)
BUN: 10 mg/dL (ref 8–27)
Bilirubin Total: 0.4 mg/dL (ref 0.0–1.2)
CO2: 24 mmol/L (ref 20–29)
Calcium: 9.4 mg/dL (ref 8.7–10.3)
Chloride: 104 mmol/L (ref 96–106)
Creatinine, Ser: 0.75 mg/dL (ref 0.57–1.00)
Globulin, Total: 2.4 g/dL (ref 1.5–4.5)
Glucose: 87 mg/dL (ref 70–99)
Potassium: 4.3 mmol/L (ref 3.5–5.2)
Sodium: 142 mmol/L (ref 134–144)
Total Protein: 7 g/dL (ref 6.0–8.5)
eGFR: 86 mL/min/1.73 (ref >59)

## 2024-02-24 LAB — VITAMIN D 25 HYDROXY (VIT D DEFICIENCY, FRACTURES): Vit D, 25-Hydroxy: 29.1 ng/mL — ABNORMAL LOW (ref 30.0–100.0)

## 2024-02-24 LAB — TSH+FREE T4
Free T4: 1.17 ng/dL (ref 0.82–1.77)
TSH: 1.44 u[IU]/mL (ref 0.450–4.500)

## 2024-02-24 LAB — HEMOGLOBIN A1C
Est. average glucose Bld gHb Est-mCnc: 120 mg/dL
Hgb A1c MFr Bld: 5.8 % — ABNORMAL HIGH (ref 4.8–5.6)

## 2024-02-25 ENCOUNTER — Ambulatory Visit: Payer: Self-pay

## 2024-02-25 DIAGNOSIS — I10 Essential (primary) hypertension: Secondary | ICD-10-CM | POA: Insufficient documentation

## 2024-02-25 NOTE — Assessment & Plan Note (Signed)
Repeat lipid panel ordered today

## 2024-02-25 NOTE — Assessment & Plan Note (Signed)
 Anxiety exacerbated by stress related to husband's health. Currently on alprazolam  0.25 mg. - Refilled alprazolam  0.25 mg prescription.

## 2024-02-25 NOTE — Assessment & Plan Note (Signed)
 Thyroid  function not checked since last visit. Possible weight management issues related to thyroid  function. - Ordered thyroid  function tests. - Will adjust levothyroxine  dose based on lab results.

## 2024-02-25 NOTE — Assessment & Plan Note (Signed)
 Blood pressure elevated at 170/97 mmHg, likely stress-related. No prior antihypertensive use. - Prescribed low dose antihypertensive medication. - Provided blood pressure targets (120/80 mmHg, acceptable up to 130/80 mmHg). - Advised home blood pressure monitoring.

## 2024-03-02 ENCOUNTER — Other Ambulatory Visit: Payer: Self-pay

## 2024-03-02 DIAGNOSIS — E66811 Obesity, class 1: Secondary | ICD-10-CM

## 2024-03-02 MED ORDER — PHENTERMINE HCL 37.5 MG PO TABS: 37.5000 mg | ORAL_TABLET | Freq: Every day | ORAL | 1 refills | Status: DC

## 2024-03-15 ENCOUNTER — Other Ambulatory Visit: Payer: Self-pay

## 2024-03-15 DIAGNOSIS — M1711 Unilateral primary osteoarthritis, right knee: Secondary | ICD-10-CM

## 2024-03-15 MED ORDER — DICLOFENAC SODIUM 75 MG PO TBEC: 75.0000 mg | DELAYED_RELEASE_TABLET | Freq: Two times a day (BID) | ORAL | 2 refills | Status: AC

## 2024-03-30 ENCOUNTER — Ambulatory Visit
Admission: EM | Admit: 2024-03-30 | Discharge: 2024-03-30 | Disposition: A | Discharge status: Home / Self Care | Source: Home / Self Care

## 2024-03-30 ENCOUNTER — Encounter: Payer: Self-pay | Admitting: Emergency Medicine

## 2024-03-30 DIAGNOSIS — J069 Acute upper respiratory infection, unspecified: Secondary | ICD-10-CM

## 2024-03-30 LAB — POC COVID19/FLU A&B COMBO
Covid Antigen, POC: NEGATIVE
Influenza A Antigen, POC: NEGATIVE
Influenza B Antigen, POC: NEGATIVE

## 2024-03-30 MED ORDER — PROMETHAZINE-DM 6.25-15 MG/5ML PO SYRP: 5.0000 mL | ORAL_SOLUTION | Freq: Four times a day (QID) | ORAL | 0 refills | Status: AC | PRN

## 2024-03-30 MED ORDER — AZELASTINE HCL 0.1 % NA SOLN: 1.0000 | Freq: Two times a day (BID) | NASAL | 0 refills | Status: AC

## 2024-03-30 NOTE — Discharge Instructions (Signed)
 In addition to the prescribed medications, you may use Coricidin HBP, plain Mucinex , saline sinus rinses, humidifiers, Tylenol  as needed.  Return for worsening or unresolving symptoms.

## 2024-03-30 NOTE — ED Provider Notes (Signed)
 [facility]    CSN: [id number] Arrival date & time: 03/30/24  1559      History   Chief Complaint No chief complaint on file.   HPI Sandra Roy is a 68 y.o. female.   Patient presenting today with 1 day history of headache, cough, sore throat, congestion.  Denies fever, chills, body aches, chest pain, shortness of breath, vomiting, diarrhea.  So far not trying anything over-the-counter for symptoms.  History of seasonal allergies not currently on anything for this.    Past Medical History:  Diagnosis Date   Adnexal mass 05/2000   Left   Anemia    history of   Anxiety    Autoimmune thyroiditis    Carpal tunnel syndrome    Right   Depression    Factor V Leiden mutation    Carrier   Foot fracture, left 05/06/2017   Hashimoto's disease    Hemorrhoids 04/25/2004   Mild external and internal noted on colonoscopy   History of colon polyps 04/25/2004   History of gastritis 04/25/2004   History of hiatal hernia 06/21/2002   History of menorrhagia    Hypoglycemic syndrome    Hypothyroidism    Lipoma    Bilateral ankles   NSVD (normal spontaneous vaginal delivery)    x3   OA (osteoarthritis)    Osteopenia    Osteopenia    Mild   PONV (postoperative nausea and vomiting)    Hysterectomy surgery blood pressure dropped   Thyroid  nodule 03/23/2013   Bilateral 4mm , noted on US    Vasovagal syncope    salty drinks help (gatorade)   Vitamin D  deficiency     Patient Active Problem List   Diagnosis Date Noted   Primary hypertension 02/25/2024   Fever blister 08/17/2023   Allergic rhinitis 11/03/2022   Increased urinary frequency 07/14/2022   Hyperlipidemia 01/08/2022   Need for influenza vaccination 01/08/2022   Obesity (BMI 30.0-34.9) 12/03/2021   Elevated blood pressure reading 12/03/2021   Preventative health care 12/03/2021   Superficial vein thrombosis 11/06/2020   Osteoarthritis of right knee 02/12/2018   S/P knee replacement 02/12/2018    History of colonic polyps 06/26/2014   Anxiety 06/26/2014   Dyspareunia 06/22/2013   Vaginal atrophy 06/22/2013   Factor 5 Leiden mutation, heterozygous 06/21/2012   Osteopenia    Hypothyroidism due to Hashimoto's thyroiditis     Past Surgical History:  Procedure Laterality Date   ABDOMINAL HYSTERECTOMY  2003   TAH/BSO   ANKLE SURGERY Bilateral 09/18/2017   excision lipomas    BLEPHAROPLASTY Bilateral    CARPAL TUNNEL RELEASE Right 2003   COLONOSCOPY     DILATION AND CURETTAGE OF UTERUS     ENDOMETRIAL ABLATION     KNEE ARTHROSCOPY  2010   RIGHT   LIPOMA EXCISION Bilateral    2019   PELVIC LAPAROSCOPY     LSO   TOTAL KNEE ARTHROPLASTY Right 02/12/2018   Procedure: RIGHT TOTAL KNEE ARTHROPLASTY;  Surgeon: Gerome Charleston, MD;  Location: [facility];  Service: Orthopedics;  Laterality: Right;   TUBAL LIGATION  1976   UPPER GI ENDOSCOPY      OB History     Gravida  4   Para  3   Term  3   Preterm      AB  1   Living  3      SAB  1   IAB      Ectopic      Multiple  Live Births  3            Home Medications    Prior to Admission medications  Medication Sig Start Date End Date Taking? Authorizing Provider  azelastine  (ASTELIN ) 0.1 % nasal spray Place 1 spray into both nostrils 2 (two) times daily. Use in each nostril as directed 03/30/24  Yes Stuart Vernell Norris, PA-C  promethazine -dextromethorphan (PROMETHAZINE -DM) 6.25-15 MG/5ML syrup Take 5 mLs by mouth 4 (four) times daily as needed. 03/30/24  Yes Stuart Vernell Norris, PA-C  ALPRAZolam  (XANAX ) 0.25 MG tablet Take 1 tablet (0.25 mg total) by mouth 2 (two) times daily as needed for anxiety. 02/23/24   [name], FNP  Cholecalciferol (VITAMIN D3) 2000 units CHEW Chew 4,000 Units by mouth daily.    [provider]  diclofenac  (VOLTAREN ) 75 MG EC tablet Take 1 tablet (75 mg total) by mouth 2 (two) times daily. 03/15/24   [name], FNP  fluticasone  (FLONASE ) 50 MCG/ACT nasal  spray Place 1 spray into both nostrils daily as needed for allergies. 12/25/23   [name], FNP  levothyroxine  (SYNTHROID ) 88 MCG tablet Take 1 tablet (88 mcg total) by mouth daily before breakfast. 08/17/23 02/23/24  Melvenia Manus BRAVO, MD  lisinopril  (ZESTRIL ) 10 MG tablet Take 1 tablet (10 mg total) by mouth daily. 02/23/24   [name], FNP  Multiple Vitamins-Minerals (MULTIVITAMIN PO) Take 3 tablets by mouth daily.    [provider]  omeprazole  (PRILOSEC) 40 MG capsule Take 1 capsule (40 mg total) by mouth daily. 01/05/24   [name], FNP  phentermine  (ADIPEX-P ) 37.5 MG tablet Take 1 tablet (37.5 mg total) by mouth daily before breakfast. 03/02/24   [name], FNP    Family History Family History  Problem Relation Age of Onset   Allergic rhinitis Mother    Diabetes Father    Heart disease Father    Hypertension Sister    Cancer Sister        LUNG  (SMOKER)   Diabetes Brother    Hypertension Brother    Breast cancer Neg Hx     Social History Social History[1]   Allergies   Adhesive [tape], Aminoquinolines, Azithromycin , Bactrim [sulfamethoxazole-trimethoprim], Clarithromycin, Naproxen sodium, Other, and Vicodin [hydrocodone-acetaminophen ]   Review of Systems Review of Systems Per HPI  Physical Exam Triage Vital Signs ED Triage Vitals [03/30/24 1627]  Encounter Vitals Group     BP (!) 144/84     Girls Systolic BP Percentile      Girls Diastolic BP Percentile      Boys Systolic BP Percentile      Boys Diastolic BP Percentile      Pulse Rate 99     Resp 18     Temp 98 F (36.7 C)     Temp Source Oral     SpO2 91 %     Weight      Height      Head Circumference      Peak Flow      Pain Score 5     Pain Loc      Pain Education      Exclude from Growth Chart    No data found.  Updated Vital Signs BP (!) 144/84 (BP Location: Right Arm)   Pulse 99   Temp 98 F (36.7 C) (Oral)   Resp 18   SpO2 91%   Visual Acuity Right Eye  Distance:   Left Eye Distance:   Bilateral Distance:    Right Eye  Near:   Left Eye Near:    Bilateral Near:     Physical Exam Vitals and nursing note reviewed.  Constitutional:      Appearance: Normal appearance.  HENT:     Head: Atraumatic.     Right Ear: Tympanic membrane and external ear normal.     Left Ear: Tympanic membrane and external ear normal.     Nose: Rhinorrhea present.     Mouth/Throat:     Mouth: Mucous membranes are moist.     Pharynx: Posterior oropharyngeal erythema present.  Eyes:     Extraocular Movements: Extraocular movements intact.     Conjunctiva/sclera: Conjunctivae normal.  Cardiovascular:     Rate and Rhythm: Normal rate and regular rhythm.     Heart sounds: Normal heart sounds.  Pulmonary:     Effort: Pulmonary effort is normal.     Breath sounds: Normal breath sounds. No wheezing or rales.  Musculoskeletal:        General: Normal range of motion.     Cervical back: Normal range of motion and neck supple.  Skin:    General: Skin is warm and dry.  Neurological:     Mental Status: She is alert and oriented to person, place, and time.  Psychiatric:        Mood and Affect: Mood normal.        Thought Content: Thought content normal.    UC Treatments / Results  Labs (all labs ordered are listed, but only abnormal results are displayed) Labs Reviewed  POC COVID19/FLU A&B COMBO    EKG   Radiology No results found.  Procedures Procedures (including critical care time)  Medications Ordered in UC Medications - No data to display  Initial Impression / Assessment and Plan / UC Course  I have reviewed the triage vital signs and the nursing notes.  Pertinent labs & imaging results that were available during my care of the patient were reviewed by me and considered in my medical decision making (see chart for details).     Vitals and exam reassuring today, rapid flu and COVID-negative.  Suspect viral respiratory infection.  Treat with  Astelin , Phenergan  DM, supportive over-the-counter medications at home care.  Return for worsening or unresolving symptoms.  Final Clinical Impressions(s) / UC Diagnoses   Final diagnoses:  Viral URI with cough     Discharge Instructions      In addition to the prescribed medications, you may use Coricidin HBP, plain Mucinex , saline sinus rinses, humidifiers, Tylenol  as needed.  Return for worsening or unresolving symptoms.    ED Prescriptions     Medication Sig Dispense Auth. Provider   azelastine  (ASTELIN ) 0.1 % nasal spray Place 1 spray into both nostrils 2 (two) times daily. Use in each nostril as directed 30 mL Stuart Vernell Norris, PA-C   promethazine -dextromethorphan (PROMETHAZINE -DM) 6.25-15 MG/5ML syrup Take 5 mLs by mouth 4 (four) times daily as needed. 100 mL Stuart Vernell Norris, [place]      PDMP not reviewed this encounter.    [1]  Social History Tobacco Use   Smoking status: Former    Current packs/day: 0.00    Types: Cigarettes    Quit date: 06/17/1981    Years since quitting: 42.8   Smokeless tobacco: Never  Vaping Use   Vaping status: Never Used  Substance Use Topics   Alcohol use: No    Alcohol/week: 0.0 standard drinks of alcohol   Drug use: No     Stuart Vernell Norris,  PA-C 03/30/24 1731

## 2024-03-30 NOTE — ED Triage Notes (Signed)
 Headache, sore throat, cough, since yesterday.

## 2024-04-26 ENCOUNTER — Ambulatory Visit

## 2024-04-26 VITALS — BP 136/78 | HR 91 | Ht 62.0 in | Wt 186.0 lb

## 2024-04-26 DIAGNOSIS — E559 Vitamin D deficiency, unspecified: Secondary | ICD-10-CM | POA: Diagnosis not present

## 2024-04-26 DIAGNOSIS — I10 Essential (primary) hypertension: Secondary | ICD-10-CM | POA: Diagnosis not present

## 2024-04-26 DIAGNOSIS — E66811 Obesity, class 1: Secondary | ICD-10-CM

## 2024-04-26 DIAGNOSIS — J069 Acute upper respiratory infection, unspecified: Secondary | ICD-10-CM

## 2024-04-26 DIAGNOSIS — E063 Autoimmune thyroiditis: Secondary | ICD-10-CM

## 2024-04-26 NOTE — Progress Notes (Unsigned)
 "  Established Patient Office Visit  Subjective   Patient ID: Sandra Roy, female    DOB: April 08, 1956  Age: 69 y.o. MRN: [id number]  Chief Complaint  Patient presents with   Medical Management of Chronic Issues    Follow up     HPI Discussed the use of AI scribe software for clinical note transcription with the patient, who gave verbal consent to proceed.  History of Present Illness    Sandra Roy is a 69 year old female with hypertension who presents for a follow-up on her blood pressure management.  Hypertension management - Started lisinopril  at last visit for blood pressure control - No adverse effects from lisinopril  - Present for follow-up of blood pressure management  Upper respiratory symptoms - Recent viral illness with significant coughing and thick nasal drainage - No fever during illness - Coughing has decreased, but thick nasal drainage persists - Frequent nose blowing causing nasal scabs - Sore throat attributed to coughing - Symptom relief with saline nasal spray, Mucinex , and warm salt water gargles  Weight management and obesity - Currently taking phentermine  for weight loss - No significant weight loss, weight remains around 185 pounds - Previous use of Mounjaro  resulted in weight loss, improved blood sugar, and reduced knee pain - Considering dietary changes, including protein or keto diet - Able to walk for exercise, but experiences knee pain after prolonged activity due to prior knee surgery  Thyroid  dysfunction - Continues thyroid  medication, recently refilled  Vitamin d  deficiency - Started vitamin D3 with K2 supplementation after low vitamin D  level of 29 (just below normal range)     Patient Active Problem List   Diagnosis Date Noted   Vitamin D  deficiency 04/27/2024   Primary hypertension 02/25/2024   Fever blister 08/17/2023   Allergic rhinitis 11/03/2022   Increased urinary frequency 07/14/2022   Hyperlipidemia  01/08/2022   Need for influenza vaccination 01/08/2022   Obesity (BMI 30.0-34.9) 12/03/2021   Elevated blood pressure reading 12/03/2021   Preventative health care 12/03/2021   Superficial vein thrombosis 11/06/2020   Osteoarthritis of right knee 02/12/2018   S/P knee replacement 02/12/2018   History of colonic polyps 06/26/2014   Anxiety 06/26/2014   Dyspareunia 06/22/2013   Vaginal atrophy 06/22/2013   Factor 5 Leiden mutation, heterozygous 06/21/2012   Osteopenia    Hypothyroidism due to Hashimoto's thyroiditis     ROS    Objective:     BP 136/78   Pulse 91   Ht 5' 2 (1.575 m)   Wt 186 lb (84.4 kg)   SpO2 99%   BMI 34.02 kg/m  BP Readings from Last 3 Encounters:  04/26/24 136/78  03/30/24 (!) 144/84  02/23/24 (!) 156/98   Wt Readings from Last 3 Encounters:  04/26/24 186 lb (84.4 kg)  02/23/24 188 lb (85.3 kg)  01/13/24 184 lb 3.2 oz (83.6 kg)     Physical Exam Vitals and nursing note reviewed.  Constitutional:      Appearance: Normal appearance. She is obese.  HENT:     Head: Normocephalic.  Eyes:     Extraocular Movements: Extraocular movements intact.     Pupils: Pupils are equal, round, and reactive to light.  Cardiovascular:     Rate and Rhythm: Normal rate and regular rhythm.  Pulmonary:     Effort: Pulmonary effort is normal.     Breath sounds: Normal breath sounds.  Musculoskeletal:     Cervical back: Normal range of motion and neck  supple.  Neurological:     Mental Status: She is alert and oriented to person, place, and time.  Psychiatric:        Mood and Affect: Mood normal.        Thought Content: Thought content normal.     No results found for any visits on 04/26/24.  Last CBC Lab Results  Component Value Date   WBC 5.7 05/07/2023   HGB 13.6 05/07/2023   HCT 40.7 05/07/2023   MCV 94 05/07/2023   MCH 31.5 05/07/2023   RDW 12.3 05/07/2023   PLT 240 05/07/2023   Last metabolic panel Lab Results  Component Value Date    GLUCOSE 87 02/23/2024   NA 142 02/23/2024   K 4.3 02/23/2024   CL 104 02/23/2024   CO2 24 02/23/2024   BUN 10 02/23/2024   CREATININE 0.75 02/23/2024   EGFR 86 02/23/2024   CALCIUM 9.4 02/23/2024   PROT 7.0 02/23/2024   ALBUMIN 4.6 02/23/2024   LABGLOB 2.4 02/23/2024   AGRATIO 2.0 02/10/2022   BILITOT 0.4 02/23/2024   ALKPHOS 114 02/23/2024   AST 19 02/23/2024   ALT 16 02/23/2024   ANIONGAP 7 02/13/2018   Last lipids Lab Results  Component Value Date   CHOL 189 02/23/2024   HDL 68 02/23/2024   LDLCALC 102 (H) 02/23/2024   TRIG 106 02/23/2024   CHOLHDL 2.8 02/23/2024   Last hemoglobin A1c Lab Results  Component Value Date   HGBA1C 5.8 (H) 02/23/2024   Last thyroid  functions Lab Results  Component Value Date   TSH 1.440 02/23/2024   T4TOTAL 7.6 06/18/2011   FREET4 1.17 02/23/2024   Last vitamin D  Lab Results  Component Value Date   VD25OH 29.1 (L) 02/23/2024   Last vitamin B12 and Folate Lab Results  Component Value Date   VITAMINB12 327 05/07/2023   FOLATE 8.2 05/07/2023     The 10-year ASCVD risk score (Arnett DK, et al., 2019) is: 13.4%    Assessment & Plan:   Problem List Items Addressed This Visit       Cardiovascular and Mediastinum   Primary hypertension - Primary   Well-controlled with lisinopril . - Continue lisinopril  as prescribed.        Endocrine   Hypothyroidism due to Hashimoto's thyroiditis   Well-managed with current thyroid  medication. - Continue current thyroid  medication as prescribed.        Other   Obesity (BMI 30.0-34.9)   Phentermine  ineffective for weight loss. Discussed dietary modifications and exercise. Knee pain limits exercise. Previous success with Mounjaro , but insurance coverage is a barrier. - Consider a protein-rich or keto diet. - Engage in low-impact exercises as tolerated.       Vitamin D  deficiency (Chronic)   Vitamin D  level at 29, indicating deficiency. Supplementation with D3 and K2 initiated. -  Continue vitamin D3 with K2 supplementation as per package instructions.         Other Visit Diagnoses       Viral URI       - Continue saline nasal spray morning and night. - Use Mucinex  to thin mucus and ensure adequate hydration.      Return in about 6 months (around 10/24/2024) for chronic follow-up with PCP.    Leita Longs, FNP  "

## 2024-04-27 DIAGNOSIS — E559 Vitamin D deficiency, unspecified: Secondary | ICD-10-CM | POA: Insufficient documentation

## 2024-04-27 NOTE — Assessment & Plan Note (Signed)
 Well-controlled with lisinopril . - Continue lisinopril  as prescribed.

## 2024-04-27 NOTE — Assessment & Plan Note (Signed)
 Vitamin D  level at 29, indicating deficiency. Supplementation with D3 and K2 initiated. - Continue vitamin D3 with K2 supplementation as per package instructions.

## 2024-04-27 NOTE — Assessment & Plan Note (Signed)
 Phentermine  ineffective for weight loss. Discussed dietary modifications and exercise. Knee pain limits exercise. Previous success with Mounjaro , but insurance coverage is a barrier. - Consider a protein-rich or keto diet. - Engage in low-impact exercises as tolerated.

## 2024-04-27 NOTE — Assessment & Plan Note (Signed)
 Well-managed with current thyroid  medication. - Continue current thyroid  medication as prescribed.

## 2024-05-03 ENCOUNTER — Other Ambulatory Visit: Payer: Self-pay

## 2024-05-03 DIAGNOSIS — E66811 Obesity, class 1: Secondary | ICD-10-CM

## 2024-05-03 MED ORDER — PHENTERMINE HCL 37.5 MG PO TABS: 37.5000 mg | ORAL_TABLET | Freq: Every day | ORAL | 1 refills | Status: AC

## 2024-05-18 ENCOUNTER — Other Ambulatory Visit: Payer: Self-pay

## 2024-05-18 DIAGNOSIS — F419 Anxiety disorder, unspecified: Secondary | ICD-10-CM

## 2024-08-05 ENCOUNTER — Ambulatory Visit: Admitting: Allergy & Immunology

## 2024-10-19 ENCOUNTER — Ambulatory Visit: Payer: Self-pay
